# Patient Record
Sex: Female | Born: 1950 | Race: Black or African American | Hispanic: No | Marital: Single | Smoking: Former smoker
Health system: Southern US, Community
[De-identification: ages and names within clinical notes are randomized; demographics above are authoritative.]

## PROBLEM LIST (undated history)

## (undated) DIAGNOSIS — D649 Anemia, unspecified: Secondary | ICD-10-CM

## (undated) DIAGNOSIS — I499 Cardiac arrhythmia, unspecified: Secondary | ICD-10-CM

## (undated) DIAGNOSIS — I480 Paroxysmal atrial fibrillation: Secondary | ICD-10-CM

## (undated) DIAGNOSIS — I509 Heart failure, unspecified: Secondary | ICD-10-CM

## (undated) DIAGNOSIS — I10 Essential (primary) hypertension: Secondary | ICD-10-CM

## (undated) DIAGNOSIS — E119 Type 2 diabetes mellitus without complications: Secondary | ICD-10-CM

## (undated) DIAGNOSIS — I5032 Chronic diastolic (congestive) heart failure: Secondary | ICD-10-CM

## (undated) DIAGNOSIS — M1990 Unspecified osteoarthritis, unspecified site: Secondary | ICD-10-CM

## (undated) HISTORY — DX: Cardiac arrhythmia, unspecified: I49.9

## (undated) HISTORY — DX: Paroxysmal atrial fibrillation: I48.0

## (undated) HISTORY — DX: Heart failure, unspecified: I50.9

## (undated) HISTORY — DX: Chronic diastolic (congestive) heart failure: I50.32

---

## 2004-07-02 ENCOUNTER — Other Ambulatory Visit: Payer: Self-pay

## 2004-07-26 ENCOUNTER — Ambulatory Visit: Payer: Self-pay

## 2007-05-15 ENCOUNTER — Ambulatory Visit: Payer: Self-pay | Admitting: Family Medicine

## 2007-08-06 ENCOUNTER — Ambulatory Visit: Payer: Self-pay

## 2008-04-11 ENCOUNTER — Ambulatory Visit: Payer: Self-pay | Admitting: Family Medicine

## 2008-12-16 ENCOUNTER — Ambulatory Visit: Payer: Self-pay | Admitting: Family Medicine

## 2009-11-20 ENCOUNTER — Ambulatory Visit: Payer: Self-pay | Admitting: Internal Medicine

## 2010-02-13 ENCOUNTER — Ambulatory Visit: Payer: Self-pay | Admitting: Family Medicine

## 2010-06-03 ENCOUNTER — Ambulatory Visit: Payer: Self-pay | Admitting: Family Medicine

## 2011-04-26 ENCOUNTER — Ambulatory Visit: Payer: Self-pay | Admitting: Family Medicine

## 2011-06-27 ENCOUNTER — Emergency Department: Payer: Self-pay | Admitting: Emergency Medicine

## 2011-06-27 ENCOUNTER — Ambulatory Visit: Payer: Self-pay | Admitting: Family Medicine

## 2013-03-04 ENCOUNTER — Ambulatory Visit: Payer: Self-pay | Admitting: Family Medicine

## 2014-02-10 ENCOUNTER — Emergency Department: Payer: Self-pay | Admitting: Emergency Medicine

## 2020-04-13 ENCOUNTER — Inpatient Hospital Stay
Admission: EM | Admit: 2020-04-13 | Discharge: 2020-04-15 | DRG: 193 | Disposition: A | Discharge status: Home / Self Care | Payer: Medicare Other | Attending: Internal Medicine | Admitting: Internal Medicine

## 2020-04-13 ENCOUNTER — Other Ambulatory Visit: Payer: Self-pay

## 2020-04-13 ENCOUNTER — Emergency Department: Payer: Medicare Other

## 2020-04-13 ENCOUNTER — Encounter: Payer: Self-pay | Admitting: Emergency Medicine

## 2020-04-13 DIAGNOSIS — Z794 Long term (current) use of insulin: Secondary | ICD-10-CM | POA: Diagnosis not present

## 2020-04-13 DIAGNOSIS — J690 Pneumonitis due to inhalation of food and vomit: Secondary | ICD-10-CM

## 2020-04-13 DIAGNOSIS — J181 Lobar pneumonia, unspecified organism: Secondary | ICD-10-CM | POA: Insufficient documentation

## 2020-04-13 DIAGNOSIS — J189 Pneumonia, unspecified organism: Secondary | ICD-10-CM

## 2020-04-13 DIAGNOSIS — E785 Hyperlipidemia, unspecified: Secondary | ICD-10-CM | POA: Diagnosis present

## 2020-04-13 DIAGNOSIS — J9600 Acute respiratory failure, unspecified whether with hypoxia or hypercapnia: Secondary | ICD-10-CM

## 2020-04-13 DIAGNOSIS — Z20822 Contact with and (suspected) exposure to covid-19: Secondary | ICD-10-CM | POA: Diagnosis present

## 2020-04-13 DIAGNOSIS — M069 Rheumatoid arthritis, unspecified: Secondary | ICD-10-CM | POA: Diagnosis present

## 2020-04-13 DIAGNOSIS — E1165 Type 2 diabetes mellitus with hyperglycemia: Secondary | ICD-10-CM

## 2020-04-13 DIAGNOSIS — Z6833 Body mass index (BMI) 33.0-33.9, adult: Secondary | ICD-10-CM | POA: Diagnosis not present

## 2020-04-13 DIAGNOSIS — J9601 Acute respiratory failure with hypoxia: Secondary | ICD-10-CM | POA: Diagnosis present

## 2020-04-13 DIAGNOSIS — E119 Type 2 diabetes mellitus without complications: Secondary | ICD-10-CM

## 2020-04-13 DIAGNOSIS — I10 Essential (primary) hypertension: Secondary | ICD-10-CM | POA: Diagnosis present

## 2020-04-13 DIAGNOSIS — Z87891 Personal history of nicotine dependence: Secondary | ICD-10-CM | POA: Diagnosis not present

## 2020-04-13 HISTORY — DX: Type 2 diabetes mellitus without complications: E11.9

## 2020-04-13 HISTORY — DX: Essential (primary) hypertension: I10

## 2020-04-13 HISTORY — DX: Unspecified osteoarthritis, unspecified site: M19.90

## 2020-04-13 HISTORY — DX: Anemia, unspecified: D64.9

## 2020-04-13 LAB — BASIC METABOLIC PANEL
Anion gap: 11 (ref 5–15)
BUN: 16 mg/dL (ref 8–23)
CO2: 25 mmol/L (ref 22–32)
Calcium: 9.2 mg/dL (ref 8.9–10.3)
Chloride: 99 mmol/L (ref 98–111)
Creatinine, Ser: 0.98 mg/dL (ref 0.44–1.00)
GFR calc Af Amer: >60 mL/min (ref >60)
GFR calc non Af Amer: 58 mL/min — ABNORMAL LOW (ref >60)
Glucose, Bld: 312 mg/dL — ABNORMAL HIGH (ref 70–99)
Potassium: 3.7 mmol/L (ref 3.5–5.1)
Sodium: 135 mmol/L (ref 135–145)

## 2020-04-13 LAB — CBC WITH DIFFERENTIAL/PLATELET
Abs Immature Granulocytes: 0.06 10*3/uL (ref 0.00–0.07)
Basophils Absolute: 0.1 10*3/uL (ref 0.0–0.1)
Basophils Relative: 1 %
Eosinophils Absolute: 0.2 10*3/uL (ref 0.0–0.5)
Eosinophils Relative: 3 %
HCT: 33.5 % — ABNORMAL LOW (ref 36.0–46.0)
Hemoglobin: 11.1 g/dL — ABNORMAL LOW (ref 12.0–15.0)
Immature Granulocytes: 1 %
Lymphocytes Relative: 25 %
Lymphs Abs: 1.9 10*3/uL (ref 0.7–4.0)
MCH: 29.6 pg (ref 26.0–34.0)
MCHC: 33.1 g/dL (ref 30.0–36.0)
MCV: 89.3 fL (ref 80.0–100.0)
Monocytes Absolute: 0.4 10*3/uL (ref 0.1–1.0)
Monocytes Relative: 5 %
Neutro Abs: 4.9 10*3/uL (ref 1.7–7.7)
Neutrophils Relative %: 65 %
Platelets: 339 10*3/uL (ref 150–400)
RBC: 3.75 MIL/uL — ABNORMAL LOW (ref 3.87–5.11)
RDW: 12.8 % (ref 11.5–15.5)
WBC: 7.5 10*3/uL (ref 4.0–10.5)
nRBC: 0 % (ref 0.0–0.2)

## 2020-04-13 LAB — GLUCOSE, CAPILLARY: Glucose-Capillary: 212 mg/dL — ABNORMAL HIGH (ref 70–99)

## 2020-04-13 LAB — TROPONIN I (HIGH SENSITIVITY)
Troponin I (High Sensitivity): 9 ng/L (ref <18)
Troponin I (High Sensitivity): 36 ng/L — ABNORMAL HIGH (ref <18)

## 2020-04-13 LAB — BRAIN NATRIURETIC PEPTIDE: B Natriuretic Peptide: 197.7 pg/mL — ABNORMAL HIGH (ref 0.0–100.0)

## 2020-04-13 LAB — SARS CORONAVIRUS 2 BY RT PCR (HOSPITAL ORDER, PERFORMED IN ~~LOC~~ HOSPITAL LAB): SARS Coronavirus 2: NEGATIVE

## 2020-04-13 LAB — HIV ANTIBODY (ROUTINE TESTING W REFLEX): HIV Screen 4th Generation wRfx: NONREACTIVE

## 2020-04-13 MED ORDER — METFORMIN HCL ER 750 MG PO TB24: 1500.0000 mg | ORAL_TABLET | Freq: Every day | ORAL | Status: DC

## 2020-04-13 MED ORDER — MAGNESIUM OXIDE 400 (241.3 MG) MG PO TABS
800.0000 mg | ORAL_TABLET | Freq: Every day | ORAL | Status: DC
  Administered 2020-04-15: 800 mg via ORAL
  Filled 2020-04-13 (×2): qty 2

## 2020-04-13 MED ORDER — METFORMIN HCL ER 750 MG PO TB24
1500.0000 mg | ORAL_TABLET | Freq: Every day | ORAL | Status: DC
  Administered 2020-04-15: 1500 mg via ORAL
  Filled 2020-04-13 (×5): qty 2

## 2020-04-13 MED ORDER — INSULIN GLARGINE 100 UNIT/ML ~~LOC~~ SOLN
18.0000 [IU] | Freq: Every day | SUBCUTANEOUS | Status: DC
  Administered 2020-04-14: 21:00:00 18 [IU] via SUBCUTANEOUS
  Filled 2020-04-13 (×3): qty 0.18

## 2020-04-13 MED ORDER — GLIPIZIDE ER 10 MG PO TB24
20.0000 mg | ORAL_TABLET | Freq: Every day | ORAL | Status: DC
  Administered 2020-04-14 – 2020-04-15 (×2): 20 mg via ORAL
  Filled 2020-04-13 (×3): qty 2

## 2020-04-13 MED ORDER — CALCIUM CARBONATE-VITAMIN D 500-200 MG-UNIT PO TABS
1.0000 | ORAL_TABLET | Freq: Every day | ORAL | Status: DC
  Administered 2020-04-14 – 2020-04-15 (×2): 1 via ORAL
  Filled 2020-04-13 (×2): qty 1

## 2020-04-13 MED ORDER — AMLODIPINE BESYLATE 5 MG PO TABS
2.5000 mg | ORAL_TABLET | Freq: Every evening | ORAL | Status: DC
  Administered 2020-04-13 – 2020-04-14 (×2): 2.5 mg via ORAL
  Filled 2020-04-13 (×2): qty 1

## 2020-04-13 MED ORDER — MAGNESIUM OXIDE 400 (241.3 MG) MG PO TABS
400.0000 mg | ORAL_TABLET | Freq: Two times a day (BID) | ORAL | Status: DC
  Administered 2020-04-13 – 2020-04-15 (×5): 400 mg via ORAL
  Filled 2020-04-13 (×4): qty 1

## 2020-04-13 MED ORDER — SODIUM CHLORIDE 0.9 % IV SOLN: INTRAVENOUS | Status: DC

## 2020-04-13 MED ORDER — AZITHROMYCIN 500 MG PO TABS
500.0000 mg | ORAL_TABLET | Freq: Every day | ORAL | Status: DC
  Administered 2020-04-13 – 2020-04-15 (×3): 500 mg via ORAL
  Filled 2020-04-13 (×3): qty 1

## 2020-04-13 MED ORDER — INSULIN ASPART 100 UNIT/ML ~~LOC~~ SOLN
0.0000 [IU] | Freq: Three times a day (TID) | SUBCUTANEOUS | Status: DC
  Administered 2020-04-13: 3 [IU] via SUBCUTANEOUS
  Administered 2020-04-14: 5 [IU] via SUBCUTANEOUS
  Administered 2020-04-14 – 2020-04-15 (×3): 2 [IU] via SUBCUTANEOUS
  Filled 2020-04-13 (×5): qty 1

## 2020-04-13 MED ORDER — LOSARTAN POTASSIUM 50 MG PO TABS
100.0000 mg | ORAL_TABLET | Freq: Every day | ORAL | Status: DC
  Administered 2020-04-13 – 2020-04-15 (×3): 100 mg via ORAL
  Filled 2020-04-13 (×3): qty 2

## 2020-04-13 MED ORDER — METHYLPREDNISOLONE SODIUM SUCC 125 MG IJ SOLR
125.0000 mg | Freq: Once | INTRAMUSCULAR | Status: AC
  Administered 2020-04-13: 125 mg via INTRAVENOUS
  Filled 2020-04-13: qty 2

## 2020-04-13 MED ORDER — ENOXAPARIN SODIUM 40 MG/0.4ML ~~LOC~~ SOLN
40.0000 mg | SUBCUTANEOUS | Status: DC
  Administered 2020-04-13 – 2020-04-14 (×2): 40 mg via SUBCUTANEOUS
  Filled 2020-04-13 (×2): qty 0.4

## 2020-04-13 MED ORDER — PROBIOTIC DIGESTIVE SUPPORT PO CAPS: 1.0000 | ORAL_CAPSULE | Freq: Every day | ORAL | Status: DC

## 2020-04-13 MED ORDER — ATORVASTATIN CALCIUM 20 MG PO TABS
40.0000 mg | ORAL_TABLET | Freq: Every evening | ORAL | Status: DC
  Administered 2020-04-13 – 2020-04-15 (×3): 40 mg via ORAL
  Filled 2020-04-13 (×3): qty 2

## 2020-04-13 MED ORDER — INSULIN ASPART 100 UNIT/ML ~~LOC~~ SOLN
0.0000 [IU] | Freq: Every day | SUBCUTANEOUS | Status: DC
  Administered 2020-04-13: 3 [IU] via SUBCUTANEOUS
  Administered 2020-04-14: 21:00:00 2 [IU] via SUBCUTANEOUS
  Filled 2020-04-13 (×2): qty 1

## 2020-04-13 MED ORDER — SODIUM CHLORIDE 0.9 % IV SOLN
3.0000 g | Freq: Once | INTRAVENOUS | Status: AC
  Administered 2020-04-13: 3 g via INTRAVENOUS
  Filled 2020-04-13: qty 8

## 2020-04-13 MED ORDER — METOPROLOL TARTRATE 50 MG PO TABS
50.0000 mg | ORAL_TABLET | Freq: Two times a day (BID) | ORAL | Status: DC
  Administered 2020-04-13 – 2020-04-15 (×4): 50 mg via ORAL
  Filled 2020-04-13 (×4): qty 1

## 2020-04-13 MED ORDER — SODIUM CHLORIDE 0.9 % IV SOLN
2.0000 g | INTRAVENOUS | Status: DC
  Administered 2020-04-13 – 2020-04-14 (×2): 2 g via INTRAVENOUS
  Filled 2020-04-13 (×3): qty 20

## 2020-04-13 MED ORDER — HYDROCHLOROTHIAZIDE 25 MG PO TABS
25.0000 mg | ORAL_TABLET | Freq: Every day | ORAL | Status: DC
  Administered 2020-04-13 – 2020-04-15 (×3): 25 mg via ORAL
  Filled 2020-04-13 (×3): qty 1

## 2020-04-13 MED ORDER — ASPIRIN EC 81 MG PO TBEC
81.0000 mg | DELAYED_RELEASE_TABLET | Freq: Every day | ORAL | Status: DC
  Administered 2020-04-13 – 2020-04-15 (×3): 81 mg via ORAL
  Filled 2020-04-13 (×3): qty 1

## 2020-04-13 MED ORDER — IPRATROPIUM-ALBUTEROL 0.5-2.5 (3) MG/3ML IN SOLN
9.0000 mL | Freq: Once | RESPIRATORY_TRACT | Status: AC
  Administered 2020-04-13: 9 mL via RESPIRATORY_TRACT
  Filled 2020-04-13: qty 9

## 2020-04-13 NOTE — ED Notes (Signed)
Pt transported from rm 2 to rm 35. Pt placed on cardiac monitor and pure wick connected. Call light within reach. Pt has no further needs at this time.

## 2020-04-13 NOTE — ED Notes (Signed)
Admitting MD at bedside at this time, pt respiratory status improved from arrival at this time. Pt's friend at this time. Pt remains on 4L via  at this time.

## 2020-04-13 NOTE — ED Notes (Signed)
Message sent to pharmacy regarding verification of patient's medications.

## 2020-04-13 NOTE — ED Provider Notes (Signed)
[facility] Emergency Department Provider Note   ____________________________________________   First MD Initiated Contact with Patient 04/13/20 1403     (approximate)  I have reviewed the triage vital signs and the nursing notes.   HISTORY  Chief Complaint Respiratory Distress    HPI Karen Murphy is a 69 y.o. female with past medical history of hypertension, hyperlipidemia, diabetes, and rheumatoid arthritis who presents to the ED complaining of shortness of breath.  Patient reports that she started coughing this afternoon and felt lots of phlegm in her chest, afterwards had significant difficulty catching her breath.  EMS was called and initially found patient to have diminished lung sounds with sats of 60% on room air.  Patient does not use any supplemental oxygen at baseline.  She does admit to having a fall last week but denies any chest pain.  She denies any fevers and has not had any pain or swelling in her legs.  Patient was placed on CPAP by EMS and reports improvement in her symptoms with the mask in place.        History reviewed. No pertinent past medical history.  There are no problems to display for this patient.   History reviewed. No pertinent surgical history.  Prior to Admission medications   Not on File    Allergies Patient has no known allergies.  History reviewed. No pertinent family history.  Social History Social History   Tobacco Use  . Smoking status: Former Games developer  . Smokeless tobacco: Never Used  Substance Use Topics  . Alcohol use: Not on file  . Drug use: Not on file    Review of Systems  Constitutional: No fever/chills Eyes: No visual changes. ENT: No sore throat. Cardiovascular: Denies chest pain. Respiratory: Positive for cough and shortness of breath. Gastrointestinal: No abdominal pain.  No nausea, no vomiting.  No diarrhea.  No constipation. Genitourinary: Negative for dysuria. Musculoskeletal:  Negative for back pain. Skin: Negative for rash. Neurological: Negative for headaches, focal weakness or numbness.  ____________________________________________   PHYSICAL EXAM:  VITAL SIGNS: ED Triage Vitals  Enc Vitals Group     BP      Pulse      Resp      Temp      Temp src      SpO2      Weight      Height      Head Circumference      Peak Flow      Pain Score      Pain Loc      Pain Edu?      Excl. in GC?     Constitutional: Alert and oriented. Eyes: Conjunctivae are normal. Head: Atraumatic. Nose: No congestion/rhinnorhea. Mouth/Throat: Mucous membranes are moist. Neck: Normal ROM Cardiovascular: Normal rate, regular rhythm. Grossly normal heart sounds. Respiratory: Tachypneic with increased respiratory effort.  No retractions. Lungs with inspiratory next Tory wheezing throughout. Gastrointestinal: Soft and nontender. No distention. Genitourinary: deferred Musculoskeletal: No lower extremity tenderness nor edema. Neurologic:  Normal speech and language. No gross focal neurologic deficits are appreciated. Skin:  Skin is warm, dry and intact. No rash noted. Psychiatric: Mood and affect are normal. Speech and behavior are normal.  ____________________________________________   LABS (all labs ordered are listed, but only abnormal results are displayed)  Labs Reviewed  CBC WITH DIFFERENTIAL/PLATELET - Abnormal; Notable for the following components:      Result Value   RBC 3.75 (*)    Hemoglobin  11.1 (*)    HCT 33.5 (*)    All other components within normal limits  BRAIN NATRIURETIC PEPTIDE - Abnormal; Notable for the following components:   B Natriuretic Peptide 197.7 (*)    All other components within normal limits  BASIC METABOLIC PANEL - Abnormal; Notable for the following components:   Glucose, Bld 312 (*)    GFR calc non Af Amer 58 (*)    All other components within normal limits  BLOOD GAS, VENOUS - Abnormal; Notable for the following components:     pCO2, Ven 61 (*)    Bicarbonate 32.9 (*)    Acid-Base Excess 5.5 (*)    All other components within normal limits  SARS CORONAVIRUS 2 BY RT PCR (HOSPITAL ORDER, PERFORMED IN [facility])  TROPONIN I (HIGH SENSITIVITY)   ____________________________________________  EKG  ED ECG REPORT I, Chesley Noon, the attending physician, personally viewed and interpreted this ECG.   Date: 04/13/2020  EKG Time: 14:06  Rate: 94  Rhythm: normal sinus rhythm  Axis: Normal  Intervals:none  ST&T Change: None   PROCEDURES  Procedure(s) performed (including Critical Care):  .Critical Care Performed by: Chesley Noon, MD Authorized by: Chesley Noon, MD   Critical care provider statement:    Critical care time (minutes):  45   Critical care time was exclusive of:  Separately billable procedures and treating other patients and teaching time   Critical care was necessary to treat or prevent imminent or life-threatening deterioration of the following conditions:  Respiratory failure   Critical care was time spent personally by me on the following activities:  Discussions with consultants, evaluation of patient's response to treatment, examination of patient, ordering and performing treatments and interventions, ordering and review of laboratory studies, ordering and review of radiographic studies, pulse oximetry, re-evaluation of patient's condition, obtaining history from patient or surrogate and review of old charts   I assumed direction of critical care for this patient from another provider in my specialty: no       ____________________________________________   INITIAL IMPRESSION / ASSESSMENT AND PLAN / ED COURSE       69 year old female presents to the ED complaining of relatively acute onset cough and difficulty breathing, noted to be in respiratory distress by EMS with O2 sats of 60% on room air.  She arrives with CPAP mask in place and states this has been  improving her breathing, was subsequently transitioned to BiPAP.  She has significant inspiratory and expiratory wheezing on my exam, although denies any history of asthma or COPD, does have remote smoking history.  We will treat with duo nebs and steroids, further assess with chest x-ray.  EKG shows no evidence of arrhythmia or ischemia and cardiac process seems less likely.  Lab work, including troponin, is pending.  Lab work is reassuring, troponin within normal limits.  Chest x-ray does show right lower lobe opacity concerning for aspiration event, which would fit with patient's acute onset of symptoms with coughing.  We will start her on Unasyn and case was discussed with hospitalist for admission.  She has been transitioned to 4 L nasal cannula from BiPAP and appears to be doing well.      ____________________________________________   FINAL CLINICAL IMPRESSION(S) / ED DIAGNOSES  Final diagnoses:  Acute respiratory failure with hypoxia (HCC)  Aspiration pneumonia of right lower lobe, unspecified aspiration pneumonia type [facility])     ED Discharge Orders    None  Note:  This document was prepared using Dragon voice recognition software and may include unintentional dictation errors.   Chesley Noon, MD 04/13/20 [id number]

## 2020-04-13 NOTE — ED Notes (Signed)
Date and time results received: 04/13/20 5:19 PM  (use smartphrase ".now" to insert current time)  Test: Trop Critical Value: 36  Name of Provider Notified: Eliane Decree  Orders Received? Or Actions Taken?: Critical results acknowledged

## 2020-04-13 NOTE — ED Notes (Signed)
Abx initiated by this RN. Pt weaned off of Bipap per EDP. Placed on 4L via [place] by this RN. EDP and RT aware that patient currently on 4L via [place]. Call bell within reach at this time. Pt states understanding to call out should she have increased SOB.

## 2020-04-13 NOTE — ED Triage Notes (Signed)
Pt presents via [facility] with c/o respiratory distress. Pt reports initially becoming short of breath while laying down. Pt with diminished lung sounds and reported to be 60% on room air. Does not report using oxygen at home. No hx of chf. Pt placed on cpap by ems and given 1/2 inch nitro paste in route. Pt switched to bipap upon arrival with oxygen saturation of 93%. Pt takes lasix daily only to increase urine output per pt report. Pt currently alert and oriented x4.

## 2020-04-13 NOTE — H&P (Addendum)
[facility] - [place] at [facility]   PATIENT NAME: Karen Murphy    MR#:  [id number]  DATE OF BIRTH:  [birth year]  DATE OF ADMISSION:  04/13/2020  PRIMARY CARE PHYSICIAN: Garnette Czech, MD   REQUESTING/REFERRING PHYSICIAN: Dr. Larinda Buttery  Patient coming from : home   CHIEF COMPLAINT:  shortness of breath today with cough  HISTORY OF PRESENT ILLNESS:  Karen Murphy  is a 69 y.o. female with a known history of palpitation, anemia, hypertension, [name], hyperlipidemia, rheumatoid arthritis comes to the emergency room with increasing shortness of breath. Patient reports having a normal morning ate her breakfast thereafter started having some cough. She tried to fight it off started having shortness of breath. Called EMS found to have sats into the 60s. She was placed on BiPAP and brought to the emergency room  ED course: in the ER patient is hemodynamically stable. She was brought on BiPAP. Currently doing my evaluation she is 98 200% on 4 L. Workup in the ER showed chest x-ray with right lower lobe pneumonia. Her BNP is 197. Patient denies any recent travel. She however visited a friend in a nursing home on Sunday. Denies any fever or chills.  Patient denies any difficulty swallowing or choking while eating. She ambulates at home by herself and is able to take care of her ADLs.  Patient received IV antibiotics. She is being admitted for acute hypoxic respiratory failure secondary to right lower lobe pneumonia likely community acquired. PAST MEDICAL HISTORY:   Past Medical History:  Diagnosis Date  . Anemia   . Arthritis   . Diabetes mellitus without complication (HCC)   . Hypertension     PAST SURGICAL HISTOIRY:  History reviewed. No pertinent surgical history.  SOCIAL HISTORY:   Social History   Tobacco Use  . Smoking status: Former Games developer  . Smokeless tobacco: Never Used  Substance Use Topics  . Alcohol use: Not on file    FAMILY HISTORY:   History reviewed. No pertinent family history.  DRUG ALLERGIES:  No Known Allergies  REVIEW OF SYSTEMS:  Review of Systems  Constitutional: Negative for chills, fever and weight loss.  HENT: Negative for ear discharge, ear pain and nosebleeds.   Eyes: Negative for blurred vision, pain and discharge.  Respiratory: Positive for cough, sputum production and shortness of breath. Negative for wheezing and stridor.   Cardiovascular: Negative for chest pain, palpitations, orthopnea and PND.  Gastrointestinal: Negative for abdominal pain, diarrhea, nausea and vomiting.  Genitourinary: Negative for frequency and urgency.  Musculoskeletal: Negative for back pain and joint pain.  Neurological: Negative for sensory change, speech change, focal weakness and weakness.  Psychiatric/Behavioral: Negative for depression and hallucinations. The patient is not nervous/anxious.      MEDICATIONS AT HOME:   Prior to Admission medications   Not on File      VITAL SIGNS:  Blood pressure (!) 173/95, pulse 86, temperature 98.2 F (36.8 C), temperature source Oral, resp. rate 18, height 5\' 6"  (1.676 m), weight 94.8 kg, SpO2 97 %.  PHYSICAL EXAMINATION:  GENERAL:  69 y.o.-year-old patient lying in the bed with no acute distress. obese EYES: Pupils equal, round, reactive to light and accommodation. No scleral icterus.  HEENT: Head atraumatic, normocephalic. Oropharynx and nasopharynx clear.  NECK:  Supple, no jugular venous distention. No thyroid enlargement, no tenderness.  LUNGSCoarse breath sounds bilaterally, no wheezing, rales,rhonchi or crepitation. No use of accessory muscles of respiration.  CARDIOVASCULAR: S1, S2 normal. No murmurs,  rubs, or gallops.  ABDOMEN: Soft, nontender, nondistended. Bowel sounds present. No organomegaly or mass.  EXTREMITIES: No pedal edema, cyanosis, or clubbing.  NEUROLOGIC: Cranial nerves II through XII are intact. Muscle strength 5/5 in all extremities. Sensation  intact. Gait not checked.  PSYCHIATRIC: The patient is alert and oriented x 3.  SKIN: No obvious rash, lesion, or ulcer.   LABORATORY PANEL:   CBC Recent Labs  Lab 04/13/20 1401  WBC 7.5  HGB 11.1*  HCT 33.5*  PLT 339   ------------------------------------------------------------------------------------------------------------------  Chemistries  Recent Labs  Lab 04/13/20 1401  NA 135  K 3.7  CL 99  CO2 25  GLUCOSE 312*  BUN 16  CREATININE 0.98  CALCIUM 9.2   ------------------------------------------------------------------------------------------------------------------  Cardiac Enzymes No results for input(s): TROPONINI in the last 168 hours. ------------------------------------------------------------------------------------------------------------------  RADIOLOGY:  DG Chest Portable 1 View  Result Date: 04/13/2020 CLINICAL DATA:  Short of breath and respiratory distress. EXAM: PORTABLE CHEST 1 VIEW COMPARISON:  None. FINDINGS: Airspace disease in the right mid and lower lung. Probable pneumonia. No effusion. Left lung clear. Negative for heart failure or edema. Heart size is within normal limits. IMPRESSION: Infiltrate in the right lung base suspicious for pneumonia. Clinical correlation and follow-up chest x-ray suggested. Electronically Signed   By: Marlan Palau M.D.   On: 04/13/2020 14:34    EKG:    IMPRESSION AND PLAN:  Karen Murphy  is a 69 y.o. female with a known history of palpitation, anemia, hypertension, [name], hyperlipidemia, rheumatoid arthritis comes to the emergency room with increasing shortness of breath. Patient reports having a normal morning ate her breakfast thereafter started having some cough. She tried to fight it off started having shortness of breath. Called EMS found to have sats into the 60s. She was placed on BiPAP and brought to the emergency room  1. Acute hypoxic respiratory failure secondary to right lower lobe  pneumonia -admit to MedSurg -IV fluids -IV Rocephin and azithromycin -follow-up sputum culture -wean to room air is able to -currently sats are 98-100% on 4 L nasal cannula -PRN nebs -incentive spirometer  2. [name], uncontrolled -patient takes Toujeo, Ozempic and po meds -will resume meds once med rec is done -[facility]I  3. HTN Cont home meds  4. Morbid obesity  5. Hyperlipidemia continue statins  6. DVT prophylaxis subcu Lovenox   Family Communication :friend in ER Consults : Code Status :FULL code d/w pt DVT prophylaxis :lovenox  Status is: Inpatient  Remains inpatient appropriate because:Ongoing diagnostic testing needed not appropriate for outpatient work up   Dispo: The patient is from: Home              Anticipated d/c is to: Home              Anticipated d/c date is: 1 day              Patient currently is not medically stable to d/c.       TOTAL TIME TAKING CARE OF THIS PATIENT: *45* minutes.    Enedina Finner M.D  [facility]     CC: Primary care physician; Garnette Czech, MD

## 2020-04-13 NOTE — ED Notes (Signed)
Pt visualized resting in bed at this time with eyes closed, pt appears more comfortable then when she arrived.

## 2020-04-14 ENCOUNTER — Encounter: Payer: Self-pay | Admitting: Internal Medicine

## 2020-04-14 DIAGNOSIS — E119 Type 2 diabetes mellitus without complications: Secondary | ICD-10-CM

## 2020-04-14 DIAGNOSIS — I10 Essential (primary) hypertension: Secondary | ICD-10-CM

## 2020-04-14 LAB — HEMOGLOBIN A1C
Hgb A1c MFr Bld: 7 % — ABNORMAL HIGH (ref 4.8–5.6)
Mean Plasma Glucose: 154 mg/dL

## 2020-04-14 LAB — GLUCOSE, CAPILLARY
Glucose-Capillary: 273 mg/dL — ABNORMAL HIGH (ref 70–99)
Glucose-Capillary: 244 mg/dL — ABNORMAL HIGH (ref 70–99)
Glucose-Capillary: 206 mg/dL — ABNORMAL HIGH (ref 70–99)
Glucose-Capillary: 267 mg/dL — ABNORMAL HIGH (ref 70–99)
Glucose-Capillary: 174 mg/dL — ABNORMAL HIGH (ref 70–99)
Glucose-Capillary: 242 mg/dL — ABNORMAL HIGH (ref 70–99)

## 2020-04-14 NOTE — Progress Notes (Signed)
[facility]  - [place] at [facility] [place]   PATIENT NAME: Karen Murphy    MR#:  [id number]  DATE OF BIRTH:  [birth year]  SUBJECTIVE:  feels a lot better today. Mild cough. No fever. No respiratory distress REVIEW OF SYSTEMS:   Review of Systems  Constitutional: Negative for chills, fever and weight loss.  HENT: Negative for ear discharge, ear pain and nosebleeds.   Eyes: Negative for blurred vision, pain and discharge.  Respiratory: Positive for cough and shortness of breath. Negative for sputum production, wheezing and stridor.   Cardiovascular: Negative for chest pain, palpitations, orthopnea and PND.  Gastrointestinal: Negative for abdominal pain, diarrhea, nausea and vomiting.  Genitourinary: Negative for frequency and urgency.  Musculoskeletal: Negative for back pain and joint pain.  Neurological: Positive for weakness. Negative for sensory change, speech change and focal weakness.  Psychiatric/Behavioral: Negative for depression and hallucinations. The patient is not nervous/anxious.    Tolerating Diet:yes Tolerating PT: ambulatory  DRUG ALLERGIES:  No Known Allergies  VITALS:  Blood pressure (!) 173/95, pulse 86, temperature 98.2 F (36.8 C), temperature source Oral, resp. rate 18, height 5\' 6"  (1.676 m), weight 94.8 kg, SpO2 97 %.  PHYSICAL EXAMINATION:   Physical Exam  GENERAL:  69 y.o.-year-old patient lying in the bed with no acute distress.  EYES: Pupils equal, round, reactive to light and accommodation. No scleral icterus.   HEENT: Head atraumatic, normocephalic. Oropharynx and nasopharynx clear.  NECK:  Supple, no jugular venous distention. No thyroid enlargement, no tenderness.  LUNGS: decreased breath sounds right LL, no wheezing, rales, rhonchi. No use of accessory muscles of respiration.  CARDIOVASCULAR: S1, S2 normal. No murmurs, rubs, or gallops.  ABDOMEN: Soft, nontender, nondistended. Bowel sounds present. No organomegaly or mass.   EXTREMITIES: No cyanosis, clubbing or edema b/l.    NEUROLOGIC: Cranial nerves II through XII are intact. No focal Motor or sensory deficits b/l.   PSYCHIATRIC:  patient is alert and oriented x 3.  SKIN: No obvious rash, lesion, or ulcer.   LABORATORY PANEL:  CBC Recent Labs  Lab 04/13/20 1401  WBC 7.5  HGB 11.1*  HCT 33.5*  PLT 339    Chemistries  Recent Labs  Lab 04/13/20 1401  NA 135  K 3.7  CL 99  CO2 25  GLUCOSE 312*  BUN 16  CREATININE 0.98  CALCIUM 9.2   Cardiac Enzymes No results for input(s): TROPONINI in the last 168 hours. RADIOLOGY:  DG Chest Portable 1 View  Result Date: 04/13/2020 CLINICAL DATA:  Short of breath and respiratory distress. EXAM: PORTABLE CHEST 1 VIEW COMPARISON:  None. FINDINGS: Airspace disease in the right mid and lower lung. Probable pneumonia. No effusion. Left lung clear. Negative for heart failure or edema. Heart size is within normal limits. IMPRESSION: Infiltrate in the right lung base suspicious for pneumonia. Clinical correlation and follow-up chest x-ray suggested. Electronically Signed   By: 04/15/2020 M.D.   On: 04/13/2020 14:34   ASSESSMENT AND PLAN:  Karen Murphy  is a 69 y.o. female with a known history of palpitation, anemia, hypertension, [name], hyperlipidemia, rheumatoid arthritis comes to the emergency room with increasing shortness of breath. Patient reports having a normal morning ate her breakfast thereafter started having some cough. She tried to fight it off started having shortness of breath. Called EMS found to have sats into the 60s. She was placed on BiPAP and brought to the emergency room  1. Acute hypoxic respiratory failure secondary to right  lower lobe pneumonia -IV fluids--d/c now -IV Rocephin and azithromycin -follow-up sputum culture -wean to room air  -currently sats are 98-100% on 2 L nasal cannula -PRN nebs -incentive spirometer -OOB to chair  2. [name],  uncontrolled -patient takes Toujeo, Ozempic and po metformin -cont home meds except ozempic (takes every Monday) -SSI  3. HTN Cont losartan,metoprolol, HCTZ  4. Morbid obesity Diet and exercise discussed  5. Hyperlipidemia  continue statins  6. DVT prophylaxis subcu Lovenox   Family Communication :friend in ER Consults :none Code Status :FULL code d/w pt DVT prophylaxis :lovenox  Status is: Inpatient  Remains inpatient appropriate because:Ongoing diagnostic testing needed not appropriate for outpatient work up   Dispo: The patient is from: Home  Anticipated d/c is to: Home  Anticipated d/c date is: likely tomorrow  Patient currently is not medically stable to d/c.will try to wean her off oxygen today and cont IV abxs for one more day Status is: Inpatient         TOTAL TIME TAKING CARE OF THIS PATIENT: *25* minutes.  >50% time spent on counselling and coordination of care  Note: This dictation was prepared with Dragon dictation along with smaller phrase technology. Any transcriptional errors that result from this process are unintentional.  Karen Murphy M.D    [facility]   CC: Primary care physician; Karen Murphy, MDPatient ID: Karen Murphy, female   DOB: [birth year], 69 y.o.   MRN: [id number]

## 2020-04-14 NOTE — Evaluation (Signed)
Clinical/Bedside Swallow Evaluation Patient Details  Name: Karen Murphy MRN: [id number] Date of Birth: [birth year]  Today's Date: 04/14/2020 Time: SLP Start Time (ACUTE ONLY): 0820 SLP Stop Time (ACUTE ONLY): 0845 SLP Time Calculation (min) (ACUTE ONLY): 25 min  Past Medical History:  Past Medical History:  Diagnosis Date  . Anemia   . Arthritis   . Diabetes mellitus without complication (HCC)   . Hypertension    Past Surgical History: History reviewed. No pertinent surgical history. HPI:  Karen Murphy is a 69 y.o. female with past medical history of hypertension, hyperlipidemia, diabetes, and rheumatoid arthritis who presents to the ED complaining of shortness of breath.  Patient reports that she started coughing this afternoon and felt lots of phlegm in her chest, afterwards had significant difficulty catching her breath.Infiltrate in the right lung base suspicious for pneumonia. Clinicalcorrelation and follow-up chest x-ray suggested.   Assessment / Plan / Recommendation Clinical Impression  Pt presents with adequate oropharyngeal abilities and is at reduced risk of aspiration when consuming regular textures and thin liquids. Pt was free of overt s/s of dysphagia and aspiration and she states that she hasn't had difficulty with PO intake and has no history of pneumonia. Pt is good historian and she states that her hypoxia didn't correlate with PO intake. At this time, current diet appears appropriate. ST to sign off at this time.   SLP Visit Diagnosis: Dysphagia, unspecified (R13.10)    Aspiration Risk  No limitations    Diet Recommendation   Age appropriate regular with thin liquids  Medication Administration: Whole meds with liquid    Other  Recommendations Oral Care Recommendations: Oral care BID   Follow up Recommendations None      Frequency and Duration  (N/A)   (N/A)       Prognosis   N/A     Swallow Study   General Date of Onset: 04/13/20 HPI: Karen Murphy is a 69  y.o. female with past medical history of hypertension, hyperlipidemia, diabetes, and rheumatoid arthritis who presents to the ED complaining of shortness of breath.  Patient reports that she started coughing this afternoon and felt lots of phlegm in her chest, afterwards had significant difficulty catching her breath.Infiltrate in the right lung base suspicious for pneumonia. Clinicalcorrelation and follow-up chest x-ray suggested. Type of Study: Bedside Swallow Evaluation Previous Swallow Assessment: none in chart Diet Prior to this Study: Regular;Thin liquids Temperature Spikes Noted: No Respiratory Status: Nasal cannula (2 liters) History of Recent Intubation: No Behavior/Cognition: Alert;Cooperative;Pleasant mood Oral Cavity Assessment: Within Functional Limits Oral Care Completed by SLP: No Oral Cavity - Dentition: Adequate natural dentition Vision: Functional for self-feeding Self-Feeding Abilities: Able to feed self Patient Positioning: Upright in chair Baseline Vocal Quality: Normal Volitional Cough: Strong Volitional Swallow: Able to elicit    Oral/Motor/Sensory Function Overall Oral Motor/Sensory Function: Within functional limits   Ice Chips Ice chips: Not tested   Thin Liquid Thin Liquid: Within functional limits Presentation: Cup;Self Fed    Nectar Thick Nectar Thick Liquid: Not tested   Honey Thick Honey Thick Liquid: Not tested   Puree Puree: Not tested   Solid     Solid: Within functional limits Presentation: Self Fed     Karen Murphy B. Dreama Saa M.S., CCC-SLP, [facility] Speech-Language Pathologist Rehabilitation Services Office [phone]  Karen Murphy Dreama Saa 04/14/2020,10:01 AM

## 2020-04-14 NOTE — Evaluation (Signed)
Physical Therapy Evaluation Patient Details Name: Karen Murphy MRN: [id number] DOB: July 08, 1951 Today's Date: 04/14/2020   History of Present Illness  Karen Murphy is a 69yoF who comes [facility] on [facility] c SOB and cough. EMS assessed sats in 60s%. PMH: anemia, HTN, DM2, HLD, RA. Workup in the ER showed chest x-ray with right lower lobe pneumonia. Her BNP is 197.  Clinical Impression  Pt admitted with above diagnosis. Pt currently with functional limitations due to the deficits listed below (see "PT Problem List"). Upon entry, pt in recliner, awake and agreeable to participate. The pt is alert and oriented x4, pleasant, conversational, and generally a good historian. All mobility performed with low effort at modI level, SpO2 monitored Q142ft, remained 88-89% on room air AMB. Returned to 1L/min in room. Patient is very near baseline, all education completed, and time is given to address all questions/concerns. No additional skilled PT services needed at this time, PT signing off. PT recommends daily ambulation ad lib or with nursing staff as needed to prevent deconditioning.      Follow Up Recommendations No PT follow up    Equipment Recommendations  None recommended by PT    Recommendations for Other Services       Precautions / Restrictions Precautions Precautions: Fall Restrictions Weight Bearing Restrictions: No      Mobility  Bed Mobility Overal bed mobility: Modified Independent                Transfers Overall transfer level: Modified independent Equipment used: None                Ambulation/Gait Ambulation/Gait assistance: Supervision Gait Distance (Feet): 500 Feet Assistive device: None Gait Pattern/deviations: WFL(Within Functional Limits) Gait velocity: 0.25m/s   General Gait Details: on room air, SpO2 remains 88-89%, no dyspnea.  Stairs            Wheelchair Mobility    Modified Rankin (Stroke Patients Only)       Balance Overall balance  assessment: Modified Independent;No apparent balance deficits (not formally assessed)                                           Pertinent Vitals/Pain Pain Assessment: No/denies pain    Home Living Family/patient expects to be discharged to:: Private residence Living Arrangements: Alone   Type of Home:  (condo) Home Access: Stairs to enter   [employer] of Steps: 1 partial step Home Layout: One level Home Equipment: None;Cane - single point Additional Comments: keeps a SPC in car and house due to intermittent knee issus, but does not use regularly. Pt endorses 1 bad fall 2 weeks ago.    Prior Function Level of Independence: Independent         Comments: walks for activity at [employer] or [employer] due to heat or inclement weather.     Hand Dominance        Extremity/Trunk Assessment   Upper Extremity Assessment Upper Extremity Assessment: Overall WFL for tasks assessed    Lower Extremity Assessment Lower Extremity Assessment: Overall WFL for tasks assessed       Communication   Communication: No difficulties  Cognition Arousal/Alertness: Awake/alert Behavior During Therapy: WFL for tasks assessed/performed Overall Cognitive Status: Within Functional Limits for tasks assessed  General Comments      Exercises     Assessment/Plan    PT Assessment Patent does not need any further PT services  PT Problem List         PT Treatment Interventions      PT Goals (Current goals can be found in the Care Plan section)  Acute Rehab PT Goals PT Goal Formulation: All assessment and education complete, DC therapy    Frequency     Barriers to discharge        Co-evaluation               AM-PAC PT "6 Clicks" Mobility  Outcome Measure Help needed turning from your back to your side while in a flat bed without using bedrails?: A Little Help needed moving from lying on your  back to sitting on the side of a flat bed without using bedrails?: A Little Help needed moving to and from a bed to a chair (including a wheelchair)?: A Little Help needed standing up from a chair using your arms (e.g., wheelchair or bedside chair)?: A Little Help needed to walk in hospital room?: A Little Help needed climbing 3-5 steps with a railing? : A Little 6 Click Score: 18    End of Session Equipment Utilized During Treatment: Gait belt;Oxygen Activity Tolerance: Patient tolerated treatment well;No increased pain Patient left: in chair;with call bell/phone within reach Nurse Communication: Mobility status      Time: 5056-9794 PT Time Calculation (min) (ACUTE ONLY): 20 min   Charges:   PT Evaluation $PT Eval Low Complexity: 1 Low          11:47 AM, 04/14/20 Rosamaria Lints, PT, DPT Physical Therapist - [facility]  [phone] (ASCOM)    Elliott Quade C 04/14/2020, 11:45 AM

## 2020-04-15 DIAGNOSIS — J189 Pneumonia, unspecified organism: Secondary | ICD-10-CM

## 2020-04-15 LAB — GLUCOSE, CAPILLARY
Glucose-Capillary: 139 mg/dL — ABNORMAL HIGH (ref 70–99)
Glucose-Capillary: 173 mg/dL — ABNORMAL HIGH (ref 70–99)

## 2020-04-15 MED ORDER — CEPHALEXIN 500 MG PO CAPS: 500.0000 mg | ORAL_CAPSULE | Freq: Two times a day (BID) | ORAL | Status: DC

## 2020-04-15 MED ORDER — CEFDINIR 300 MG PO CAPS
300.0000 mg | ORAL_CAPSULE | Freq: Two times a day (BID) | ORAL | Status: DC
  Administered 2020-04-15: 300 mg via ORAL
  Filled 2020-04-15 (×2): qty 1

## 2020-04-15 MED ORDER — AZITHROMYCIN 500 MG PO TABS: 500.0000 mg | ORAL_TABLET | Freq: Every day | ORAL | 0 refills | Status: DC

## 2020-04-15 MED ORDER — CEPHALEXIN 500 MG PO CAPS: 500.0000 mg | ORAL_CAPSULE | Freq: Two times a day (BID) | ORAL | 0 refills | Status: DC

## 2020-04-15 MED ORDER — CEFDINIR 300 MG PO CAPS: 300.0000 mg | ORAL_CAPSULE | Freq: Two times a day (BID) | ORAL | 0 refills | Status: AC

## 2020-04-15 NOTE — Discharge Summary (Signed)
[facility] [place] [facility]   PATIENT NAME: Karen Murphy    MR#:  [id number]  DATE OF BIRTH:  [birth year]  DATE OF ADMISSION:  04/13/2020 ADMITTING PHYSICIAN: Enedina Finner, MD  DATE OF DISCHARGE: 04/15/2020  PRIMARY CARE PHYSICIAN: Garnette Czech, MD    ADMISSION DIAGNOSIS:  Acute respiratory failure with hypoxia (HCC) [J96.01] Acute hypoxemic respiratory failure (HCC) [J96.01] Aspiration pneumonia of right lower lobe, unspecified aspiration pneumonia type (HCC) [J69.0]  DISCHARGE DIAGNOSIS:  Acute Hypoxic respiratory failure resolved Right LL CAP  SECONDARY DIAGNOSIS:   Past Medical History:  Diagnosis Date  . Anemia   . Arthritis   . Diabetes mellitus without complication (HCC)   . Hypertension     HOSPITAL COURSE:   [name], anemia, hypertension, [name], hyperlipidemia, rheumatoid arthritis comes to the emergency room with increasing shortness of breath. Patient reports having a normal morning ate her breakfast thereafter started having some cough. She tried to fight it off started having shortness of breath. Called EMS found to have sats into the 60s. She was placed on BiPAP and brought to the emergency room  1.Acute hypoxic respiratory failure secondary to right lower lobe pneumonia -IV fluids--d/c now -IV Rocephin and azithromycin--change to po abxs -wean to room air  -currently sats are 98-100% on 2 L nasal cannula--weaned to RA -PRN nebs -incentive spirometer -OOB to chair -Ambualted well with PT--no needs -ST--no s/o aspiration  2.[name], uncontrolled -patient takesToujeo, Ozempic and po metformin -cont home meds except ozempic (takes every Monday) -SSI -A1c is 7.0  3. HTN Cont losartan,metoprolol, HCTZ  4.Morbid obesity Diet and exercise discussed  5.Hyperlipidemia  continue statins  6.DVT prophylaxis subcu Lovenox  Overall  improved. D/c home--pt agreeable  Family Communication:friend in ER Consults:none Code Status:FULL code d/w pt DVT prophylaxis:lovenox  Status is: Inpatient  Dispo: The patient is from:Home Anticipated d/c is RC:VELF Anticipated d/c date YB:OFBPZ Patient currently is  medically stable to d/c   CONSULTS OBTAINED:    DRUG ALLERGIES:  No Known Allergies  DISCHARGE MEDICATIONS:   Allergies as of 04/15/2020   No Known Allergies     Medication List    TAKE these medications   acetaminophen 325 MG tablet Commonly known as: TYLENOL Take 650 mg by mouth every 6 (six) hours as needed for mild pain or moderate pain.   amLODipine 2.5 MG tablet Commonly known as: NORVASC Take 2.5 mg by mouth every evening.   atorvastatin 40 MG tablet Commonly known as: LIPITOR Take 40 mg by mouth every evening.   azithromycin 500 MG tablet Commonly known as: ZITHROMAX Take 1 tablet (500 mg total) by mouth daily. Start taking on: April 16, 2020   Calcium 600/Vitamin D 600-400 MG-UNIT Tabs Generic drug: Calcium Carbonate-Vitamin D3 Take 1 tablet by mouth daily.   cephALEXin 500 MG capsule Commonly known as: KEFLEX Take 1 capsule (500 mg total) by mouth every 12 (twelve) hours for 5 days. What changed: when to take this   glipiZIDE 10 MG 24 hr tablet Commonly known as: GLUCOTROL XL Take 20 mg by mouth daily.   hydrochlorothiazide 25 MG tablet Commonly known as: HYDRODIURIL Take 25 mg by mouth daily.   losartan 100 MG tablet Commonly known as: COZAAR Take 100 mg by mouth daily.   magnesium oxide 400 MG tablet Commonly known as: MAG-OX Take 400-800 mg by mouth See admin instructions. Take 2 tablets (800mg ) by mouth every morning, 1  tablet (400mg ) by mouth at midday and take 1 tablet (400mg ) by mouth every evening   metFORMIN 500 MG 24 hr tablet Commonly known as: GLUCOPHAGE-XR Take 1,500 mg by mouth daily with supper.    metoprolol tartrate 50 MG tablet Commonly known as: LOPRESSOR Take 50 mg by mouth 2 (two) times daily.   naproxen sodium 220 MG tablet Commonly known as: ALEVE Take 220 mg by mouth every 12 (twelve) hours as needed (pain).   ondansetron 8 MG tablet Commonly known as: ZOFRAN Take 8 mg by mouth every 8 (eight) hours as needed for nausea or vomiting.   Ozempic (0.25 or 0.5 MG/DOSE) 2 MG/1.5ML Sopn Generic drug: Semaglutide(0.25 or 0.5MG /DOS) Inject 0.5 mg into the skin every Monday.   Probiotic Digestive Support Caps Take 1 capsule by mouth daily.   Toujeo SoloStar 300 UNIT/ML Solostar Pen Generic drug: insulin glargine (1 Unit Dial) Inject 18 Units into the skin at bedtime.       If you experience worsening of your admission symptoms, develop shortness of breath, life threatening emergency, suicidal or homicidal thoughts you must seek medical attention immediately by calling 911 or calling your MD immediately  if symptoms less severe.  You Must read complete instructions/literature along with all the possible adverse reactions/side effects for all the Medicines you take and that have been prescribed to you. Take any new Medicines after you have completely understood and accept all the possible adverse reactions/side effects.   Please note  You were cared for by a hospitalist during your hospital stay. If you have any questions about your discharge medications or the care you received while you were in the hospital after you are discharged, you can call the unit and asked to speak with the hospitalist on call if the hospitalist that took care of you is not available. Once you are discharged, your primary care physician will handle any further medical issues. Please note that NO REFILLS for any discharge medications will be authorized once you are discharged, as it is imperative that you return to your primary care physician (or establish a relationship with a primary care physician if  you do not have one) for your aftercare needs so that they can reassess your need for medications and monitor your lab values. Today   SUBJECTIVE   Feels a lot better  VITAL SIGNS:  Blood pressure (!) 158/77, pulse 68, temperature 97.9 F (36.6 C), temperature source Oral, resp. rate 18, height 5\' 6"  (1.676 m), weight 94.8 kg, SpO2 100 %.  I/O:  No intake or output data in the 24 hours ending 04/15/20 0906  PHYSICAL EXAMINATION:  GENERAL:  69 y.o.-year-old patient lying in the bed with no acute distress.  EYES: Pupils equal, round, reactive to light and accommodation. No scleral icterus.  HEENT: Head atraumatic, normocephalic. Oropharynx and nasopharynx clear.  NECK:  Supple, no jugular venous distention. No thyroid enlargement, no tenderness.  LUNGS: Normal breath sounds bilaterally, no wheezing, rales,rhonchi or crepitation. No use of accessory muscles of respiration.  CARDIOVASCULAR: S1, S2 normal. No murmurs, rubs, or gallops.  ABDOMEN: Soft, non-tender, non-distended. Bowel sounds present. No organomegaly or mass.  EXTREMITIES: No pedal edema, cyanosis, or clubbing.  NEUROLOGIC: Cranial nerves II through XII are intact. Muscle strength 5/5 in all extremities. Sensation intact. Gait not checked.  PSYCHIATRIC: The patient is alert and oriented x 3.  SKIN: No obvious rash, lesion, or ulcer.   DATA REVIEW:   CBC  Recent Labs  Lab 04/13/20 1401  WBC 7.5  HGB 11.1*  HCT 33.5*  PLT 339    Chemistries  Recent Labs  Lab 04/13/20 1401  NA 135  K 3.7  CL 99  CO2 25  GLUCOSE 312*  BUN 16  CREATININE 0.98  CALCIUM 9.2    Microbiology Results   Recent Results (from the past 240 hour(s))  SARS Coronavirus 2 by RT PCR (hospital order, performed in [facility] hospital lab) Nasopharyngeal Nasopharyngeal Swab     Status: None   Collection Time: 04/13/20  2:01 PM   Specimen: Nasopharyngeal Swab  Result Value Ref Range Status   SARS Coronavirus 2 NEGATIVE NEGATIVE Final     Comment: (NOTE) SARS-CoV-2 target nucleic acids are NOT DETECTED.  The SARS-CoV-2 RNA is generally detectable in upper and lower respiratory specimens during the acute phase of infection. The lowest concentration of SARS-CoV-2 viral copies this assay can detect is 250 copies / mL. A negative result does not preclude SARS-CoV-2 infection and should not be used as the sole basis for treatment or other patient management decisions.  A negative result may occur with improper specimen collection / handling, submission of specimen other than nasopharyngeal swab, presence of viral mutation(s) within the areas targeted by this assay, and inadequate number of viral copies (<250 copies / mL). A negative result must be combined with clinical observations, patient history, and epidemiological information.  Fact Sheet for Patients:   BoilerBrush.com.cy  Fact Sheet for Healthcare Providers: https://pope.com/  This test is not yet approved or  cleared by the [place] FDA and has been authorized for detection and/or diagnosis of SARS-CoV-2 by FDA under an Emergency Use Authorization (EUA).  This EUA will remain in effect (meaning this test can be used) for the duration of the COVID-19 declaration under Section 564(b)(1) of the Act, 21 U.S.C. section 360bbb-3(b)(1), unless the authorization is terminated or revoked sooner.  Performed at [facility], [address] [place], [place] [address]     RADIOLOGY:  DG Chest Portable 1 View  Result Date: 04/13/2020 CLINICAL DATA:  Short of breath and respiratory distress. EXAM: PORTABLE CHEST 1 VIEW COMPARISON:  None. FINDINGS: Airspace disease in the right mid and lower lung. Probable pneumonia. No effusion. Left lung clear. Negative for heart failure or edema. Heart size is within normal limits. IMPRESSION: Infiltrate in the right lung base suspicious for pneumonia. Clinical correlation  and follow-up chest x-ray suggested. Electronically Signed   By: Marlan Palau M.D.   On: 04/13/2020 14:34     CODE STATUS:     Code Status Orders  (From admission, onward)         Start     Ordered   04/13/20 1605  Full code  Continuous        04/13/20 1604        Code Status History    This patient has a current code status but no historical code status.   Advance Care Planning Activity    Advance Directive Documentation     Most Recent Value  Type of Advance Directive Healthcare Power of Attorney  Pre-existing out of facility DNR order (yellow form or pink MOST form) --  "MOST" Form in Place? --       TOTAL TIME TAKING CARE OF THIS PATIENT: *40* minutes.    Enedina Finner M.D  [facility]    CC: Primary care physician; Garnette Czech, MD

## 2020-04-15 NOTE — Care Management Important Message (Signed)
Important Message  Patient Details  Name: Karen Murphy MRN: [id number] Date of Birth: 05/07/1951   Medicare Important Message Given:  Yes     [name] 04/15/2020, 10:43 AM

## 2020-04-20 LAB — BLOOD GAS, VENOUS
Acid-Base Excess: 5.5 mmol/L — ABNORMAL HIGH (ref 0.0–2.0)
Bicarbonate: 32.9 mmol/L — ABNORMAL HIGH (ref 20.0–28.0)
O2 Saturation: 43.2 %
Patient temperature: 37
pCO2, Ven: 61 mmHg — ABNORMAL HIGH (ref 44.0–60.0)
pH, Ven: 7.34 (ref 7.250–7.430)

## 2020-07-24 ENCOUNTER — Emergency Department: Payer: Medicare Other

## 2020-07-24 ENCOUNTER — Encounter: Payer: Self-pay | Admitting: Emergency Medicine

## 2020-07-24 ENCOUNTER — Inpatient Hospital Stay: Payer: Medicare Other

## 2020-07-24 ENCOUNTER — Observation Stay
Admission: EM | Admit: 2020-07-24 | Discharge: 2020-07-25 | Disposition: A | Discharge status: Home / Self Care | Payer: Medicare Other | Attending: Internal Medicine | Admitting: Internal Medicine

## 2020-07-24 ENCOUNTER — Other Ambulatory Visit: Payer: Self-pay

## 2020-07-24 DIAGNOSIS — I10 Essential (primary) hypertension: Secondary | ICD-10-CM | POA: Diagnosis not present

## 2020-07-24 DIAGNOSIS — Z79899 Other long term (current) drug therapy: Secondary | ICD-10-CM | POA: Insufficient documentation

## 2020-07-24 DIAGNOSIS — Z794 Long term (current) use of insulin: Secondary | ICD-10-CM | POA: Insufficient documentation

## 2020-07-24 DIAGNOSIS — R55 Syncope and collapse: Secondary | ICD-10-CM | POA: Diagnosis present

## 2020-07-24 DIAGNOSIS — E119 Type 2 diabetes mellitus without complications: Secondary | ICD-10-CM | POA: Diagnosis not present

## 2020-07-24 DIAGNOSIS — I4891 Unspecified atrial fibrillation: Principal | ICD-10-CM | POA: Diagnosis present

## 2020-07-24 DIAGNOSIS — Z23 Encounter for immunization: Secondary | ICD-10-CM | POA: Insufficient documentation

## 2020-07-24 DIAGNOSIS — Z87891 Personal history of nicotine dependence: Secondary | ICD-10-CM | POA: Insufficient documentation

## 2020-07-24 DIAGNOSIS — Z20822 Contact with and (suspected) exposure to covid-19: Secondary | ICD-10-CM | POA: Diagnosis not present

## 2020-07-24 LAB — TSH: TSH: 0.746 u[IU]/mL (ref 0.350–4.500)

## 2020-07-24 LAB — CBC
HCT: 33.6 % — ABNORMAL LOW (ref 36.0–46.0)
Hemoglobin: 11.5 g/dL — ABNORMAL LOW (ref 12.0–15.0)
MCH: 30.3 pg (ref 26.0–34.0)
MCHC: 34.2 g/dL (ref 30.0–36.0)
MCV: 88.4 fL (ref 80.0–100.0)
Platelets: 350 10*3/uL (ref 150–400)
RBC: 3.8 MIL/uL — ABNORMAL LOW (ref 3.87–5.11)
RDW: 12.3 % (ref 11.5–15.5)
WBC: 8 10*3/uL (ref 4.0–10.5)
nRBC: 0 % (ref 0.0–0.2)

## 2020-07-24 LAB — TROPONIN I (HIGH SENSITIVITY)
Troponin I (High Sensitivity): 14 ng/L (ref <18)
Troponin I (High Sensitivity): 19 ng/L — ABNORMAL HIGH (ref <18)
Troponin I (High Sensitivity): 17 ng/L (ref <18)

## 2020-07-24 LAB — BASIC METABOLIC PANEL
Anion gap: 14 (ref 5–15)
BUN: 20 mg/dL (ref 8–23)
CO2: 22 mmol/L (ref 22–32)
Calcium: 9.5 mg/dL (ref 8.9–10.3)
Chloride: 96 mmol/L — ABNORMAL LOW (ref 98–111)
Creatinine, Ser: 1.11 mg/dL — ABNORMAL HIGH (ref 0.44–1.00)
GFR, Estimated: 50 mL/min — ABNORMAL LOW (ref >60)
Glucose, Bld: 265 mg/dL — ABNORMAL HIGH (ref 70–99)
Potassium: 3.8 mmol/L (ref 3.5–5.1)
Sodium: 132 mmol/L — ABNORMAL LOW (ref 135–145)

## 2020-07-24 LAB — RESPIRATORY PANEL BY RT PCR (FLU A&B, COVID)
Influenza A by PCR: NEGATIVE
Influenza B by PCR: NEGATIVE
SARS Coronavirus 2 by RT PCR: NEGATIVE

## 2020-07-24 LAB — BRAIN NATRIURETIC PEPTIDE: B Natriuretic Peptide: 63.6 pg/mL (ref 0.0–100.0)

## 2020-07-24 LAB — HEMOGLOBIN A1C
Hgb A1c MFr Bld: 7.5 % — ABNORMAL HIGH (ref 4.8–5.6)
Mean Plasma Glucose: 168.55 mg/dL

## 2020-07-24 LAB — MAGNESIUM: Magnesium: 1.7 mg/dL (ref 1.7–2.4)

## 2020-07-24 LAB — GLUCOSE, CAPILLARY: Glucose-Capillary: 184 mg/dL — ABNORMAL HIGH (ref 70–99)

## 2020-07-24 MED ORDER — DILTIAZEM HCL 25 MG/5ML IV SOLN
10.0000 mg | Freq: Once | INTRAVENOUS | Status: AC
  Administered 2020-07-24: 10 mg via INTRAVENOUS
  Filled 2020-07-24: qty 5

## 2020-07-24 MED ORDER — BACITRACIN ZINC 500 UNIT/GM EX OINT
TOPICAL_OINTMENT | Freq: Two times a day (BID) | CUTANEOUS | Status: DC
  Filled 2020-07-24 (×2): qty 0.9

## 2020-07-24 MED ORDER — DILTIAZEM HCL 25 MG/5ML IV SOLN
10.0000 mg | Freq: Once | INTRAVENOUS | Status: AC
  Administered 2020-07-24: 10 mg via INTRAVENOUS

## 2020-07-24 MED ORDER — METOPROLOL TARTRATE 50 MG PO TABS
50.0000 mg | ORAL_TABLET | Freq: Two times a day (BID) | ORAL | Status: DC
  Administered 2020-07-24 – 2020-07-25 (×3): 50 mg via ORAL
  Filled 2020-07-24 (×3): qty 1

## 2020-07-24 MED ORDER — ONDANSETRON HCL 4 MG/2ML IJ SOLN: 4.0000 mg | Freq: Four times a day (QID) | INTRAMUSCULAR | Status: DC | PRN

## 2020-07-24 MED ORDER — LOSARTAN POTASSIUM 50 MG PO TABS: 100.0000 mg | ORAL_TABLET | Freq: Every day | ORAL | Status: DC

## 2020-07-24 MED ORDER — ACETAMINOPHEN 325 MG PO TABS
650.0000 mg | ORAL_TABLET | Freq: Four times a day (QID) | ORAL | Status: DC | PRN
  Administered 2020-07-24 – 2020-07-25 (×3): 650 mg via ORAL
  Filled 2020-07-24 (×3): qty 2

## 2020-07-24 MED ORDER — SODIUM CHLORIDE 0.9 % IV BOLUS
1000.0000 mL | Freq: Once | INTRAVENOUS | Status: AC
  Administered 2020-07-24: 1000 mL via INTRAVENOUS

## 2020-07-24 MED ORDER — DILTIAZEM HCL-DEXTROSE 125-5 MG/125ML-% IV SOLN (PREMIX)
5.0000 mg/h | INTRAVENOUS | Status: DC
  Administered 2020-07-24: 5 mg/h via INTRAVENOUS
  Filled 2020-07-24: qty 125

## 2020-07-24 MED ORDER — DILTIAZEM HCL 25 MG/5ML IV SOLN
INTRAVENOUS | Status: AC
  Filled 2020-07-24: qty 5

## 2020-07-24 MED ORDER — ACETAMINOPHEN 325 MG PO TABS: 650.0000 mg | ORAL_TABLET | ORAL | Status: DC | PRN

## 2020-07-24 MED ORDER — ATORVASTATIN CALCIUM 20 MG PO TABS
40.0000 mg | ORAL_TABLET | Freq: Every evening | ORAL | Status: DC
  Administered 2020-07-24: 40 mg via ORAL
  Filled 2020-07-24 (×2): qty 2

## 2020-07-24 MED ORDER — CALCIUM CARBONATE-VITAMIN D 500-200 MG-UNIT PO TABS
1.0000 | ORAL_TABLET | Freq: Every day | ORAL | Status: DC
  Administered 2020-07-24 – 2020-07-25 (×2): 1 via ORAL
  Filled 2020-07-24 (×3): qty 1

## 2020-07-24 MED ORDER — TETANUS-DIPHTH-ACELL PERTUSSIS 5-2.5-18.5 LF-MCG/0.5 IM SUSP
0.5000 mL | Freq: Once | INTRAMUSCULAR | Status: AC
  Administered 2020-07-24: 0.5 mL via INTRAMUSCULAR
  Filled 2020-07-24: qty 0.5

## 2020-07-24 MED ORDER — INSULIN ASPART 100 UNIT/ML ~~LOC~~ SOLN
0.0000 [IU] | Freq: Three times a day (TID) | SUBCUTANEOUS | Status: DC
  Administered 2020-07-24 – 2020-07-25 (×2): 3 [IU] via SUBCUTANEOUS
  Administered 2020-07-25: 8 [IU] via SUBCUTANEOUS
  Filled 2020-07-24 (×3): qty 1

## 2020-07-24 MED ORDER — APIXABAN 5 MG PO TABS
5.0000 mg | ORAL_TABLET | Freq: Two times a day (BID) | ORAL | Status: DC
  Administered 2020-07-25 (×2): 5 mg via ORAL
  Filled 2020-07-24 (×2): qty 1

## 2020-07-24 MED ORDER — RISAQUAD PO CAPS
1.0000 | ORAL_CAPSULE | Freq: Every day | ORAL | Status: DC
  Administered 2020-07-24 – 2020-07-25 (×2): 1 via ORAL
  Filled 2020-07-24 (×2): qty 1

## 2020-07-24 NOTE — Consult Note (Signed)
Cardiology Consultation:   [name] MRN: [id number]; DOB: 1951-10-09  Admit date: 07/24/2020 Date of Consult: 07/24/2020  Primary Care Provider: Garnette Czech, MD [facility] Cardiologist: New to [facility] Physician requesting consult: Dr. Joylene Igo Reason for consult: Ectopy, atrial fibrillation with RVR   Patient Profile:   Karen Murphy is a 69 y.o. female with a hx of hypertension, former smoker, diabetes, hospital admission July 2021 for acute respiratory failure with hypoxia/pneumonia Presenting with syncope, atrial fibrillation with RVR  History of Present Illness:   Karen Murphy woke this morning, appreciated tachypalpitations, called her friend, was standing in her house when she felt lightheaded, weak, had syncope, woke up on the ground.  She struck her right side of her face on a piece of furniture, Denies any prior history of atrial fibrillation Reports staying adequately hydrated Reports that she was on the phone with a friend at the time when she had syncope, he called [phone]  Denies any tachycardia palpitations yesterday  Takes  metoprolol, losartan, HCTZ,Amlodipine  CT head no acute findings CT face no facial fracture Chest x-ray nonacute  In the emergency room treated with diltiazem, though noted to have persistent orthostasis Elevated rate with standing.  Documented rate 115 up to 145-150 with standing Negative cardiac enzymes  Reports lots of snoring, never had sleep study   Past Medical History:  Diagnosis Date  . Anemia   . Arthritis   . Diabetes mellitus without complication (HCC)   . Hypertension     History reviewed. No pertinent surgical history.   Home Medications:  Prior to Admission medications   Medication Sig Start Date End Date Taking? Authorizing Provider  ascorbic acid (VITAMIN C) 500 MG tablet Take 500 mg by mouth daily.   Yes [provider]  atorvastatin (LIPITOR) 40 MG tablet Take 40 mg by mouth every  evening. 01/04/20  Yes [provider]  Calcium Carbonate-Vitamin D3 (CALCIUM 600/VITAMIN D) 600-400 MG-UNIT TABS Take 1 tablet by mouth daily.   Yes [provider]  glipiZIDE (GLUCOTROL XL) 10 MG 24 hr tablet Take 20 mg by mouth daily. 02/03/20  Yes [provider]  Lactobacillus-Inulin (PROBIOTIC DIGESTIVE SUPPORT) CAPS Take 1 capsule by mouth daily.   Yes [provider]  losartan (COZAAR) 100 MG tablet Take 100 mg by mouth daily. 03/21/20  Yes [provider]  metFORMIN (GLUCOPHAGE-XR) 500 MG 24 hr tablet Take 1,500 mg by mouth daily with supper. 10/19/19  Yes [provider]  metoprolol tartrate (LOPRESSOR) 50 MG tablet Take 50 mg by mouth 2 (two) times daily. 01/04/20  Yes [provider]  TOUJEO SOLOSTAR 300 UNIT/ML Solostar Pen Inject 18 Units into the skin at bedtime. 01/04/20  Yes [provider]  acetaminophen (TYLENOL) 325 MG tablet Take 650 mg by mouth every 6 (six) hours as needed for mild pain or moderate pain.    [provider]  naproxen sodium (ALEVE) 220 MG tablet Take 220 mg by mouth every 12 (twelve) hours as needed (pain).    [provider]  OZEMPIC, 0.25 OR 0.5 MG/DOSE, 2 MG/1.5ML SOPN Inject 0.5 mg into the skin every Monday. 03/22/20   [provider]    Inpatient Medications: Scheduled Meds: . apixaban  5 mg Oral BID  . atorvastatin  40 mg Oral QPM  . bacitracin   Topical BID  . Calcium Carbonate-Vitamin D3  1 tablet Oral Daily  . diltiazem      . insulin aspart  0-15 Units  Subcutaneous TID WC  . losartan  100 mg Oral Daily  . metoprolol tartrate  50 mg Oral BID  . Probiotic Digestive Support  1 capsule Oral Daily   Continuous Infusions: . diltiazem (CARDIZEM) infusion 5 mg/hr (07/24/20 1502)   PRN Meds: acetaminophen, ondansetron (ZOFRAN) IV  Allergies:   No Known Allergies  Social History:   Social History   Socioeconomic History  . Marital status: Single     Spouse name: Not on file  . Number of children: Not on file  . Years of education: Not on file  . Highest education level: Not on file  Occupational History  . Not on file  Tobacco Use  . Smoking status: Former Games developer  . Smokeless tobacco: Never Used  Substance and Sexual Activity  . Alcohol use: Not on file  . Drug use: Not on file  . Sexual activity: Not on file  Other Topics Concern  . Not on file  Social History Narrative  . Not on file   Social Determinants of Health   Financial Resource Strain:   . Difficulty of Paying Living Expenses: Not on file  Food Insecurity:   . Worried About Programme researcher, broadcasting/film/video in the Last Year: Not on file  . Ran Out of Food in the Last Year: Not on file  Transportation Needs:   . Lack of Transportation (Medical): Not on file  . Lack of Transportation (Non-Medical): Not on file  Physical Activity:   . Days of Exercise per Week: Not on file  . Minutes of Exercise per Session: Not on file  Stress:   . Feeling of Stress : Not on file  Social Connections:   . Frequency of Communication with Friends and Family: Not on file  . Frequency of Social Gatherings with Friends and Family: Not on file  . Attends Religious Services: Not on file  . Active Member of Clubs or Organizations: Not on file  . Attends Banker Meetings: Not on file  . Marital Status: Not on file  Intimate Partner Violence:   . Fear of Current or Ex-Partner: Not on file  . Emotionally Abused: Not on file  . Physically Abused: Not on file  . Sexually Abused: Not on file    Family History:    Family History  Problem Relation Age of Onset  . Hypertension Mother   . Hypertension Father      ROS:  Please see the history of present illness.  Review of Systems  Constitutional: Negative.   HENT: Negative.   Respiratory: Negative.   Cardiovascular: Negative.   Gastrointestinal: Negative.   Musculoskeletal: Negative.   Neurological: Positive for loss of  consciousness.  Psychiatric/Behavioral: Negative.   All other systems reviewed and are negative.   Physical Exam/Data:   Vitals:   07/24/20 1300 07/24/20 1330 07/24/20 1400 07/24/20 1430  BP: 137/69 (!) 144/61 122/68 112/68  Pulse: 65 89 86 73  Resp: 16 13 17  (!) 23  SpO2: 96% 97% 97% 97%   No intake or output data in the 24 hours ending 07/24/20 1506 Last 3 Weights 04/13/2020  Weight (lbs) 208 lb 15.9 oz  Weight (kg) 94.8 kg     There is no height or weight on file to calculate BMI.  General:  Well nourished, well developed, in no acute distress HEENT: normal Lymph: no adenopathy Neck: no JVD Endocrine:  No thryomegaly Vascular: No carotid bruits; FA pulses 2+ bilaterally without bruits  Cardiac: Irregularly irregular no murmur  Lungs:  clear to auscultation bilaterally, no wheezing, rhonchi or rales  Abd: soft, nontender, no hepatomegaly  Ext: no edema Musculoskeletal:  No deformities, BUE and BLE strength normal and equal Skin: warm and dry  Neuro:  CNs 2-12 intact, no focal abnormalities noted Psych:  Normal affect   EKG:  The EKG was personally reviewed and demonstrates:   Atrial fibrillation rate 133 bpm no significant ST-T wave changes  Telemetry:  Telemetry was personally reviewed and demonstrates: Atrial fibrillation rate 120 up to 140 bpm  Relevant CV Studies: Echocardiogram pending  Laboratory Data:  High Sensitivity Troponin:   Recent Labs  Lab 07/24/20 1001  TROPONINIHS 14     Chemistry Recent Labs  Lab 07/24/20 1001  NA 132*  K 3.8  CL 96*  CO2 22  GLUCOSE 265*  BUN 20  CREATININE 1.11*  CALCIUM 9.5  GFRNONAA 50*  ANIONGAP 14    No results for input(s): PROT, ALBUMIN, AST, ALT, ALKPHOS, BILITOT in the last 168 hours. Hematology Recent Labs  Lab 07/24/20 1001  WBC 8.0  RBC 3.80*  HGB 11.5*  HCT 33.6*  MCV 88.4  MCH 30.3  MCHC 34.2  RDW 12.3  PLT 350   BNP Recent Labs  Lab 07/24/20 1001  BNP 63.6    DDimer No results  for input(s): DDIMER in the last 168 hours.   Radiology/Studies:  DG Chest 2 View  Result Date: 07/24/2020 CLINICAL DATA:  Syncope EXAM: CHEST - 2 VIEW COMPARISON:  April 13, 2020 FINDINGS: The cardiomediastinal silhouette is unchanged in contour.Resolution of previously described RIGHT basilar heterogeneous opacities. No pleural effusion. No pneumothorax. No acute pleuroparenchymal abnormality. Visualized abdomen is unremarkable. Multilevel degenerative changes of the thoracic spine. IMPRESSION: Resolution of previously described RIGHT basilar heterogeneous opacities. No new focal consolidation. Electronically Signed   By: Meda Klinefelter MD   On: 07/24/2020 11:17   CT Head Wo Contrast  Result Date: 07/24/2020 CLINICAL DATA:  Hit face EXAM: CT HEAD WITHOUT CONTRAST CT MAXILLOFACIAL WITHOUT CONTRAST TECHNIQUE: Multidetector CT imaging of the head and maxillofacial structures were performed using the standard protocol without intravenous contrast. Multiplanar CT image reconstructions of the maxillofacial structures were also generated. COMPARISON:  June 27, 2011 FINDINGS: CT HEAD FINDINGS Brain: No evidence of acute infarction, hemorrhage, hydrocephalus, extra-axial collection or mass lesion/mass effect. Mildly advanced global parenchymal volume loss for age. Vascular: Vascular calcifications of the carotid siphons. Skull: Normal. Negative for fracture or focal lesion. Other: None. CT MAXILLOFACIAL FINDINGS Osseous: No fracture or mandibular dislocation. No destructive process. Orbits: Negative. No traumatic or inflammatory finding. Sinuses: Mild to moderate mucosal thickening of the RIGHT sphenoid sinus. Soft tissues: Laceration of the RIGHT cheek. IMPRESSION: 1.  No acute intracranial abnormality. 2. No fracture or dislocation of the facial bones. 3. Laceration of the RIGHT cheek. Electronically Signed   By: Meda Klinefelter MD   On: 07/24/2020 11:24   CT Maxillofacial Wo Contrast  Result  Date: 07/24/2020 CLINICAL DATA:  Hit face EXAM: CT HEAD WITHOUT CONTRAST CT MAXILLOFACIAL WITHOUT CONTRAST TECHNIQUE: Multidetector CT imaging of the head and maxillofacial structures were performed using the standard protocol without intravenous contrast. Multiplanar CT image reconstructions of the maxillofacial structures were also generated. COMPARISON:  June 27, 2011 FINDINGS: CT HEAD FINDINGS Brain: No evidence of acute infarction, hemorrhage, hydrocephalus, extra-axial collection or mass lesion/mass effect. Mildly advanced global parenchymal volume loss for age. Vascular: Vascular calcifications of the carotid siphons. Skull: Normal. Negative for fracture or focal lesion. Other: None.  CT MAXILLOFACIAL FINDINGS Osseous: No fracture or mandibular dislocation. No destructive process. Orbits: Negative. No traumatic or inflammatory finding. Sinuses: Mild to moderate mucosal thickening of the RIGHT sphenoid sinus. Soft tissues: Laceration of the RIGHT cheek. IMPRESSION: 1.  No acute intracranial abnormality. 2. No fracture or dislocation of the facial bones. 3. Laceration of the RIGHT cheek. Electronically Signed   By: Meda Klinefelter MD   On: 07/24/2020 11:24     Assessment and Plan:   -Syncope In the setting of new onset atrial fibrillation with RVR rate up to 150 bpm documented in the emergency room Note indicating orthostasis in the ER despite IV fluids -Started on diltiazem infusion for rate control Losartan held CT scan head nonacute  Atrial fibrillation with RVR New onset, difficult to control rate on diltiazem secondary to low blood pressure and orthostasis -If rate continues to run high on diltiazem and oral metoprolol, May need to consider other options including digoxin -If unable to control rate adequately, may need TEE cardioversion -Started on anticoagulation Eliquis 5 twice daily by hospitalist service -For now would continue diltiazem infusion with metoprolol and monitor  rate.  May convert overnight  Essential hypertension We will hold the losartan in the setting of atrial fibrillation with low blood pressure, difficult to control rate Continue the metoprolol with diltiazem Further titration of the diltiazem and metoprolol as blood pressure permits  Diabetes type 2 Hemoglobin A1c 7.0 Managed by primary care   Total encounter time more than 110 minutes  Greater than 50% was spent in counseling and coordination of care with the patient   For questions or updates, please contact [facility] Please consult [email or web address] for contact info under    Signed, Julien Nordmann, MD  07/24/2020 3:06 PM

## 2020-07-24 NOTE — ED Notes (Signed)
  Pt helped to stand at bedside. HR went from 115 p to 145-150. Pt helped back into bed. EDP notified.

## 2020-07-24 NOTE — ED Notes (Signed)
Pt given meal tray.

## 2020-07-24 NOTE — ED Triage Notes (Signed)
Pt to ED via [facility] from home syncopal episode. Per EMS pt stood up to get out of bed and had syncopal episode and hit her head on the night stand. Pt has small laceration to the right cheek. Per EMS is pt c/o nausea. Pt is A & O on arrival. Per EMS pt HR irregular rate 110-140. Pt A & O

## 2020-07-24 NOTE — H&P (Signed)
History and Physical    Karen Murphy EHM:[id number] DOB: 1951/02/14 DOA: 07/24/2020  PCP: Garnette Czech, MD   Patient coming from: Home  I have personally briefly reviewed patient's old medical records in [facility]  Chief Complaint: Syncopal episode  HPI: Karen Murphy is a 69 y.o. female with medical history significant for hypertension, diabetes mellitus and chronic anemia who was brought into the ER via EMS after she had a syncopal episode at home.  Patient attempted to get out of bed when she became diaphoretic, developed palpitations and lightheadedness and woke up on the floor.  She hit her right cheek on the nightstand and has a laceration.  Her friend who she was on the phone with called EMS.  Per EMS she was awake, alert and oriented to person place and time upon their arrival.  She was noted to have an irregular heart rate 110 - 140bpm.  She does not know how long she was down for. She complains of nausea but denies having any chest pain, no diaphoresis, no emesis, no changes in her bowel habits, no fever, no chills, no urinary symptoms, no headache, no cough. She denies having a known history of atrial fibrillation. Twelve-lead EKG done in the ER showed A. fib with rapid ventricular rate with no acute ST-T wave changes. She received 2 doses of IV Cardizem and is currently rate controlled but when she gets up from a supine position she becomes tachycardic. Labs show sodium 132, potassium 3.8, chloride 96, bicarb 22, glucose 265, BUN 20, creatinine 1.1, calcium 9.5, BNP 63, troponin XIV, white count 8.0, hemoglobin 11.5, hematocrit 33.6, MCV 88.4, RDW 12.3, platelet count 350 Respiratory viral panel is negative CT scan of the head without contrast/maxillofacial CT shows no acute intracranial abnormality.  No fracture or dislocation of the facial bones.  Laceration of the right cheek Chest x-ray reviewed by me shows no obvious infiltrate or effusion. Twelve-lead EKG shows  rapid atrial fibrillation   ED Course: Patient is a 69 year old female who presents to the emergency room via EMS after she had a syncopal episode at home.  She was found to be in new onset atrial fibrillation with rapid ventricular rate and received 2 doses of IV Cardizem with improvement in her heart rate. When patient attempts to get up from a supine position she becomes tachycardic again. She has a CHADS2VASC score of 3 and ideally requires anticoagulation as primary prophylaxis for an acute stroke.  She will be admitted to the hospital for further evaluation.  Review of Systems: As per HPI otherwise 10 point review of systems negative.    Past Medical History:  Diagnosis Date  . Anemia   . Arthritis   . Diabetes mellitus without complication (HCC)   . Hypertension     History reviewed. No pertinent surgical history.   reports that she has quit smoking. She has never used smokeless tobacco. No history on file for alcohol use and drug use.  No Known Allergies  No family history on file.   Prior to Admission medications   Medication Sig Start Date End Date Taking? Authorizing Provider  acetaminophen (TYLENOL) 325 MG tablet Take 650 mg by mouth every 6 (six) hours as needed for mild pain or moderate pain.    [provider]  amLODipine (NORVASC) 2.5 MG tablet Take 2.5 mg by mouth every evening. 01/20/20   [provider]  atorvastatin (LIPITOR) 40 MG tablet Take 40 mg by mouth every evening. 01/04/20  [provider]  azithromycin (ZITHROMAX) 500 MG tablet Take 1 tablet (500 mg total) by mouth daily. 04/16/20   Enedina Finner, MD  Calcium Carbonate-Vitamin D3 (CALCIUM 600/VITAMIN D) 600-400 MG-UNIT TABS Take 1 tablet by mouth daily.    [provider]  glipiZIDE (GLUCOTROL XL) 10 MG 24 hr tablet Take 20 mg by mouth daily. 02/03/20   [provider]  hydrochlorothiazide (HYDRODIURIL) 25 MG tablet Take 25 mg by mouth daily. 10/19/19   [provider]  Lactobacillus-Inulin (PROBIOTIC DIGESTIVE SUPPORT) CAPS Take 1 capsule by mouth daily.    [provider]  losartan (COZAAR) 100 MG tablet Take 100 mg by mouth daily. 03/21/20   [provider]  magnesium oxide (MAG-OX) 400 MG tablet Take 400-800 mg by mouth See admin instructions. Take 2 tablets (800mg ) by mouth every morning, 1 tablet (400mg ) by mouth at midday and take 1 tablet (400mg ) by mouth every evening 12/08/19   [provider]  metFORMIN (GLUCOPHAGE-XR) 500 MG 24 hr tablet Take 1,500 mg by mouth daily with supper. 10/19/19   [provider]  metoprolol tartrate (LOPRESSOR) 50 MG tablet Take 50 mg by mouth 2 (two) times daily. 01/04/20   [provider]  naproxen sodium (ALEVE) 220 MG tablet Take 220 mg by mouth every 12 (twelve) hours as needed (pain).    [provider]  ondansetron (ZOFRAN) 8 MG tablet Take 8 mg by mouth every 8 (eight) hours as needed for nausea or vomiting. 01/05/20   [provider]  OZEMPIC, 0.25 OR 0.5 MG/DOSE, 2 MG/1.5ML SOPN Inject 0.5 mg into the skin every Monday. 03/22/20   [provider]  TOUJEO SOLOSTAR 300 UNIT/ML Solostar Pen Inject 18 Units into the skin at bedtime. 01/04/20   [provider]    Physical Exam: Vitals:   07/24/20 0952 07/24/20 1000 07/24/20 1030  BP: (!) 142/92 (!) 147/66 (!) 102/58  Pulse: 78 (!) 144 80  Resp: 16 11 17   SpO2: 98% 96% 97%     Vitals:   07/24/20 0952 07/24/20 1000 07/24/20 1030  BP: (!) 142/92 (!) 147/66 (!) 102/58  Pulse: 78 (!) 144 80  Resp: 16 11 17   SpO2: 98% 96% 97%    Constitutional: NAD, alert and oriented x 3 Eyes: PERRL, lids and conjunctivae normal ENMT: Mucous membranes are moist. Laceration over right cheek (POA) Neck: normal, supple, no masses, no thyromegaly Respiratory: clear to auscultation bilaterally, no wheezing, no crackles. Normal respiratory effort. No accessory muscle use.  Cardiovascular:  Irregularly irregular, tachycardic no murmurs / rubs / gallops. No extremity edema. 2+ pedal pulses. No carotid bruits.  Abdomen: no tenderness, no masses palpated. No hepatosplenomegaly. Bowel sounds positive.  Musculoskeletal: no clubbing / cyanosis. No joint deformity upper and lower extremities.  Skin: no rashes, lesions, ulcers.  Neurologic: No gross focal neurologic deficit. Psychiatric: Normal mood and affect.   Labs on Admission: I have personally reviewed following labs and imaging studies  CBC: Recent Labs  Lab 07/24/20 1001  WBC 8.0  HGB 11.5*  HCT 33.6*  MCV 88.4  PLT 350   Basic Metabolic Panel: Recent Labs  Lab 07/24/20 1001  NA 132*  K 3.8  CL 96*  CO2 22  GLUCOSE 265*  BUN 20  CREATININE 1.11*  CALCIUM 9.5  MG 1.7   GFR: CrCl cannot be calculated (Unknown ideal weight.). Liver Function Tests: No results for input(s): AST, ALT, ALKPHOS, BILITOT, PROT, ALBUMIN in the last 168 hours. No results  for input(s): LIPASE, AMYLASE in the last 168 hours. No results for input(s): AMMONIA in the last 168 hours. Coagulation Profile: No results for input(s): INR, PROTIME in the last 168 hours. Cardiac Enzymes: No results for input(s): CKTOTAL, CKMB, CKMBINDEX, TROPONINI in the last 168 hours. BNP (last 3 results) No results for input(s): PROBNP in the last 8760 hours. HbA1C: No results for input(s): HGBA1C in the last 72 hours. CBG: No results for input(s): GLUCAP in the last 168 hours. Lipid Profile: No results for input(s): CHOL, HDL, LDLCALC, TRIG, CHOLHDL, LDLDIRECT in the last 72 hours. Thyroid Function Tests: No results for input(s): TSH, T4TOTAL, FREET4, T3FREE, THYROIDAB in the last 72 hours. Anemia Panel: No results for input(s): VITAMINB12, FOLATE, FERRITIN, TIBC, IRON, RETICCTPCT in the last 72 hours. Urine analysis: No results found for: COLORURINE, APPEARANCEUR, LABSPEC, PHURINE, GLUCOSEU, HGBUR, BILIRUBINUR, KETONESUR, PROTEINUR, UROBILINOGEN,  NITRITE, LEUKOCYTESUR  Radiological Exams on Admission: DG Chest 2 View  Result Date: 07/24/2020 CLINICAL DATA:  Syncope EXAM: CHEST - 2 VIEW COMPARISON:  April 13, 2020 FINDINGS: The cardiomediastinal silhouette is unchanged in contour.Resolution of previously described RIGHT basilar heterogeneous opacities. No pleural effusion. No pneumothorax. No acute pleuroparenchymal abnormality. Visualized abdomen is unremarkable. Multilevel degenerative changes of the thoracic spine. IMPRESSION: Resolution of previously described RIGHT basilar heterogeneous opacities. No new focal consolidation. Electronically Signed   By: Meda Klinefelter MD   On: 07/24/2020 11:17   CT Head Wo Contrast  Result Date: 07/24/2020 CLINICAL DATA:  Hit face EXAM: CT HEAD WITHOUT CONTRAST CT MAXILLOFACIAL WITHOUT CONTRAST TECHNIQUE: Multidetector CT imaging of the head and maxillofacial structures were performed using the standard protocol without intravenous contrast. Multiplanar CT image reconstructions of the maxillofacial structures were also generated. COMPARISON:  June 27, 2011 FINDINGS: CT HEAD FINDINGS Brain: No evidence of acute infarction, hemorrhage, hydrocephalus, extra-axial collection or mass lesion/mass effect. Mildly advanced global parenchymal volume loss for age. Vascular: Vascular calcifications of the carotid siphons. Skull: Normal. Negative for fracture or focal lesion. Other: None. CT MAXILLOFACIAL FINDINGS Osseous: No fracture or mandibular dislocation. No destructive process. Orbits: Negative. No traumatic or inflammatory finding. Sinuses: Mild to moderate mucosal thickening of the RIGHT sphenoid sinus. Soft tissues: Laceration of the RIGHT cheek. IMPRESSION: 1.  No acute intracranial abnormality. 2. No fracture or dislocation of the facial bones. 3. Laceration of the RIGHT cheek. Electronically Signed   By: Meda Klinefelter MD   On: 07/24/2020 11:24   CT Maxillofacial Wo Contrast  Result Date:  07/24/2020 CLINICAL DATA:  Hit face EXAM: CT HEAD WITHOUT CONTRAST CT MAXILLOFACIAL WITHOUT CONTRAST TECHNIQUE: Multidetector CT imaging of the head and maxillofacial structures were performed using the standard protocol without intravenous contrast. Multiplanar CT image reconstructions of the maxillofacial structures were also generated. COMPARISON:  June 27, 2011 FINDINGS: CT HEAD FINDINGS Brain: No evidence of acute infarction, hemorrhage, hydrocephalus, extra-axial collection or mass lesion/mass effect. Mildly advanced global parenchymal volume loss for age. Vascular: Vascular calcifications of the carotid siphons. Skull: Normal. Negative for fracture or focal lesion. Other: None. CT MAXILLOFACIAL FINDINGS Osseous: No fracture or mandibular dislocation. No destructive process. Orbits: Negative. No traumatic or inflammatory finding. Sinuses: Mild to moderate mucosal thickening of the RIGHT sphenoid sinus. Soft tissues: Laceration of the RIGHT cheek. IMPRESSION: 1.  No acute intracranial abnormality. 2. No fracture or dislocation of the facial bones. 3. Laceration of the RIGHT cheek. Electronically Signed   By: Meda Klinefelter MD   On: 07/24/2020 11:24    EKG: Independently reviewed.  Atrial fibrillation with rapid ventricular rate  Assessment/Plan Principal Problem:   Atrial fibrillation with rapid ventricular response (HCC) Active Problems:   Type 2 diabetes mellitus without complication, with long-term current use of insulin (HCC)   Essential hypertension   Syncope and collapse      Atrial fibrillation with rapid ventricular rate New onset Patient found to be in rapid A. fib and received a dose of IV Cardizem in the emergency room with improvement in heart rate She has a CHADS2VASC score of 3 and ideally requires long-term anticoagulation as primary prophylaxis for an acute stroke Will start patient on apixaban 5 mg twice daily Start patient on a Cardizem drip to optimize rate  control Continue metoprolol 50 mg twice daily for rate control We will obtain TSH  Obtain 2D echocardiogram to assess LVEF and rule out valvular pathology Obtain serial cardiac enzymes to rule out acute coronary syndrome We will consult cardiology   Syncope and collapse Unclear etiology and may be related new onset rapid A. Fib Orthostatic blood pressure checks Obtain 2D echocardiogram to assess for aortic stenosis Cardiac monitoring over the next 24 to 48 hours to rule out any other arrhythmias   Type 2 diabetes mellitus Maintain consistent carbohydrate diet Glycemic control with insulin   Hypertension Continue Cozaar and metoprolol   Diabetes mellitus Maintain consistent carbohydrate diet Place patient on sliding scale insulin for glycemic control   DVT prophylaxis: Apixaban Code Status: Full code Family Communication: Greater than 50% of time was spent discussing plan of care with patient at the bedside.  All questions and concerns have been addressed.  She verbalizes understanding and agrees with the plan. Disposition Plan: Back to previous home environment Consults called: Cardiology    Karen Spillman MD [facility]     07/24/2020, 1:08 PM

## 2020-07-24 NOTE — ED Notes (Signed)
Pt transported to CT ?

## 2020-07-24 NOTE — ED Notes (Signed)
Attending provider at bedside

## 2020-07-24 NOTE — ED Notes (Signed)
Pt monitor alarming for increased HR. RN in room, pt states that she sat up to eat. Pts HR between 120's-140s. Pt HR very sensitive to movement. Pt is in NAD at this time. Attending MD sent secure chat about pts HR. Waiting new orders, RN will continue to monitor.

## 2020-07-24 NOTE — ED Provider Notes (Signed)
[facility] Emergency Department Provider Note  ____________________________________________   First MD Initiated Contact with Patient 07/24/20 [phone]     (approximate)  I have reviewed the triage vital signs and the nursing notes.   HISTORY  Chief Complaint Loss of Consciousness    HPI Karen Murphy is a 69 y.o. female with history of hypertension, diabetes, here with syncope.  Patient states that she woke up this morning and felt like she was having some heart palpitations/fluttering in her chest.  She thought she had some heartburn as well.  She states her friend called, and she stood up to start getting ready for the day.  She then began to feel very lightheaded, weak, and next thing she remembers, she woke up on the ground.  She struck her right cheek on a nearby piece of furniture.  She does not know how long she was down for.  She states she has some minimal pain along her right cheek when she presses it, but otherwise no significant headache or neck pain.  No focal numbness or weakness.  No chest pain.  She does continue to feel some palpitations.  Denies known history of A. fib.  No other medical complaints or recent illnesses.  No recent medication changes.  She does note that she has had somewhat decreased appetite recently, but is adamant that she drinks plenty of water.  No other complaints.        Past Medical History:  Diagnosis Date  . Anemia   . Arthritis   . Diabetes mellitus without complication (HCC)   . Hypertension     Patient Active Problem List   Diagnosis Date Noted  . Community acquired pneumonia of right lower lobe of lung   . Essential hypertension   . Acute respiratory failure with hypoxia (HCC) 04/13/2020  . Lobar pneumonia (HCC)   . Type 2 diabetes mellitus without complication, with long-term current use of insulin (HCC)     History reviewed. No pertinent surgical history.  Prior to Admission medications   Medication Sig  Start Date End Date Taking? Authorizing Provider  acetaminophen (TYLENOL) 325 MG tablet Take 650 mg by mouth every 6 (six) hours as needed for mild pain or moderate pain.    [provider]  amLODipine (NORVASC) 2.5 MG tablet Take 2.5 mg by mouth every evening. 01/20/20   [provider]  atorvastatin (LIPITOR) 40 MG tablet Take 40 mg by mouth every evening. 01/04/20   [provider]  azithromycin (ZITHROMAX) 500 MG tablet Take 1 tablet (500 mg total) by mouth daily. 04/16/20   Enedina Finner, MD  Calcium Carbonate-Vitamin D3 (CALCIUM 600/VITAMIN D) 600-400 MG-UNIT TABS Take 1 tablet by mouth daily.    [provider]  glipiZIDE (GLUCOTROL XL) 10 MG 24 hr tablet Take 20 mg by mouth daily. 02/03/20   [provider]  hydrochlorothiazide (HYDRODIURIL) 25 MG tablet Take 25 mg by mouth daily. 10/19/19   [provider]  Lactobacillus-Inulin (PROBIOTIC DIGESTIVE SUPPORT) CAPS Take 1 capsule by mouth daily.    [provider]  losartan (COZAAR) 100 MG tablet Take 100 mg by mouth daily. 03/21/20   [provider]  magnesium oxide (MAG-OX) 400 MG tablet Take 400-800 mg by mouth See admin instructions. Take 2 tablets (800mg ) by mouth every morning, 1 tablet (400mg ) by mouth at midday and take 1 tablet (400mg ) by mouth every evening 12/08/19   [provider]  metFORMIN (GLUCOPHAGE-XR) 500 MG 24 hr tablet  Take 1,500 mg by mouth daily with supper. 10/19/19   [provider]  metoprolol tartrate (LOPRESSOR) 50 MG tablet Take 50 mg by mouth 2 (two) times daily. 01/04/20   [provider]  naproxen sodium (ALEVE) 220 MG tablet Take 220 mg by mouth every 12 (twelve) hours as needed (pain).    [provider]  ondansetron (ZOFRAN) 8 MG tablet Take 8 mg by mouth every 8 (eight) hours as needed for nausea or vomiting. 01/05/20   [provider]  OZEMPIC, 0.25 OR 0.5 MG/DOSE, 2 MG/1.5ML SOPN Inject 0.5 mg into the skin  every Monday. 03/22/20   [provider]  TOUJEO SOLOSTAR 300 UNIT/ML Solostar Pen Inject 18 Units into the skin at bedtime. 01/04/20   [provider]    Allergies Patient has no known allergies.  No family history on file.  Social History Social History   Tobacco Use  . Smoking status: Former Games developer  . Smokeless tobacco: Never Used  Substance Use Topics  . Alcohol use: Not on file  . Drug use: Not on file    Review of Systems  Review of Systems  Constitutional: Positive for fatigue. Negative for fever.  HENT: Negative for congestion and sore throat.   Eyes: Negative for visual disturbance.  Respiratory: Negative for cough and shortness of breath.   Cardiovascular: Positive for palpitations. Negative for chest pain.  Gastrointestinal: Negative for abdominal pain, diarrhea, nausea and vomiting.  Genitourinary: Negative for flank pain.  Musculoskeletal: Negative for back pain and neck pain.  Skin: Negative for rash and wound.  Neurological: Positive for syncope and weakness.  All other systems reviewed and are negative.    ____________________________________________  PHYSICAL EXAM:      VITAL SIGNS: ED Triage Vitals  Enc Vitals Group     BP 07/24/20 0952 (!) 142/92     Pulse Rate 07/24/20 0952 78     Resp 07/24/20 0952 16     Temp --      Temp src --      SpO2 07/24/20 0952 98 %     Weight --      Height --      Head Circumference --      Peak Flow --      Pain Score 07/24/20 0953 5     Pain Loc --      Pain Edu? --      Excl. in GC? --      Physical Exam Vitals and nursing note reviewed.  Constitutional:      General: She is not in acute distress.    Appearance: She is well-developed.  HENT:     Head: Normocephalic and atraumatic.     Comments: Superficial abrasion to the right cheek.  No deep lacerations.  Mild surrounding tenderness without deformity. Eyes:     Conjunctiva/sclera: Conjunctivae normal.  Cardiovascular:     Rate  and Rhythm: Tachycardia present. Rhythm irregularly irregular.     Heart sounds: Normal heart sounds. No murmur heard.  No friction rub.  Pulmonary:     Effort: Pulmonary effort is normal. No respiratory distress.     Breath sounds: Normal breath sounds. No wheezing or rales.  Abdominal:     General: There is no distension.     Palpations: Abdomen is soft.     Tenderness: There is no abdominal tenderness.  Musculoskeletal:     Cervical back: Neck supple.  Skin:    General: Skin is warm.  Capillary Refill: Capillary refill takes less than 2 seconds.  Neurological:     Mental Status: She is alert and oriented to person, place, and time.     Motor: No abnormal muscle tone.       ____________________________________________   LABS (all labs ordered are listed, but only abnormal results are displayed)  Labs Reviewed  BASIC METABOLIC PANEL - Abnormal; Notable for the following components:      Result Value   Sodium 132 (*)    Chloride 96 (*)    Glucose, Bld 265 (*)    Creatinine, Ser 1.11 (*)    GFR, Estimated 50 (*)    All other components within normal limits  CBC - Abnormal; Notable for the following components:   RBC 3.80 (*)    Hemoglobin 11.5 (*)    HCT 33.6 (*)    All other components within normal limits  RESPIRATORY PANEL BY RT PCR (FLU A&B, COVID)  MAGNESIUM  BRAIN NATRIURETIC PEPTIDE  URINALYSIS, COMPLETE (UACMP) WITH MICROSCOPIC  CBG MONITORING, ED  TROPONIN I (HIGH SENSITIVITY)    ____________________________________________  EKG: Atrial fibrillation, ventricular rate 133.  QRS 92, QTc 460.  No acute ST elevations or depressions. ________________________________________  RADIOLOGY All imaging, including plain films, CT scans, and ultrasounds, independently reviewed by me, and interpretations confirmed via formal radiology reads.  ED MD interpretation:   CT head: no acute abnormality CT face:no apparent facial fx Chest x-ray: no new consolidation,  no ptx or htx  Official radiology report(s): DG Chest 2 View  Result Date: 07/24/2020 CLINICAL DATA:  Syncope EXAM: CHEST - 2 VIEW COMPARISON:  April 13, 2020 FINDINGS: The cardiomediastinal silhouette is unchanged in contour.Resolution of previously described RIGHT basilar heterogeneous opacities. No pleural effusion. No pneumothorax. No acute pleuroparenchymal abnormality. Visualized abdomen is unremarkable. Multilevel degenerative changes of the thoracic spine. IMPRESSION: Resolution of previously described RIGHT basilar heterogeneous opacities. No new focal consolidation. Electronically Signed   By: Meda Klinefelter MD   On: 07/24/2020 11:17    ____________________________________________  PROCEDURES   Procedure(s) performed (including Critical Care):  Procedures  ____________________________________________  INITIAL IMPRESSION / MDM / ASSESSMENT AND PLAN / ED COURSE  As part of my medical decision making, I reviewed the following data within the electronic MEDICAL RECORD NUMBER Nursing notes reviewed and incorporated, Old chart reviewed, Notes from prior ED visits, and [place] Controlled Substance Database       *Rosebella Nieves was evaluated in Emergency Department on 07/24/2020 for the symptoms described in the history of present illness. She was evaluated in the context of the global COVID-19 pandemic, which necessitated consideration that the patient might be at risk for infection with the SARS-CoV-2 virus that causes COVID-19. Institutional protocols and algorithms that pertain to the evaluation of patients at risk for COVID-19 are in a state of rapid change based on information released by regulatory bodies including the CDC and federal and state organizations. These policies and algorithms were followed during the patient's care in the ED.  Some ED evaluations and interventions may be delayed as a result of limited staffing during the pandemic.*     Medical Decision Making:  69 yo F  here with new onset afib RVR, improved with dilt but persistently orthostatic with HR 130-140s w/ standing only. Labs overall unremarkable with exception of mild hyperglycemia. Lytes o/w wnl. Trop neg. Mag wnl. BNP normal. CT Head/Face negative for fx, reviewed by me. CXR reviewed by me and unremarkable. Admit for management of sx afib  rvr.  CHA2Ds2-VASc Score for Atrial Fibrillation    Patient Score  Age <65 = 0 65-74 = 1 > 75 = 2 1  Sex Female = 0 Female = 1 1  CHF History No = 0  Yes = 1 0  HTN History No = 0  Yes = 1 1  Stroke/TIA/TE History No = 0  Yes = 1 0  Vascular Disease History No = 0  Yes = 1 0  Diabetes History No = 0  Yes = 1 1  Total:  3   3.2 % stroke rate/year from a score of 3  ____________________________________________  FINAL CLINICAL IMPRESSION(S) / ED DIAGNOSES  Final diagnoses:  New onset atrial fibrillation (HCC)     MEDICATIONS GIVEN DURING THIS VISIT:  Medications  bacitracin ointment ( Topical Given 07/24/20 1017)  diltiazem (CARDIZEM) 25 MG/5ML injection (has no administration in time range)  diltiazem (CARDIZEM) injection 10 mg (has no administration in time range)  sodium chloride 0.9 % bolus 1,000 mL (has no administration in time range)  diltiazem (CARDIZEM) injection 10 mg (10 mg Intravenous Given 07/24/20 1007)  Tdap (BOOSTRIX) injection 0.5 mL (0.5 mLs Intramuscular Given 07/24/20 1016)     ED Discharge Orders    None       Note:  This document was prepared using Dragon voice recognition software and may include unintentional dictation errors.   Shaune Pollack, MD 07/24/20 1123

## 2020-07-24 NOTE — ED Notes (Signed)
Dr Gollan at bedside °

## 2020-07-24 NOTE — ED Notes (Signed)
Pt back from CT

## 2020-07-24 NOTE — ED Notes (Signed)
Pt had syncopal episode at home while standing.

## 2020-07-24 NOTE — ED Notes (Signed)
Helped to sit at side of bed per EDP request to evaluate HR with sitting and standing. Pt currently sitting on side of bed with legs dangling. HR 95-115 on monitor. Pt denies dizziness and SOB with sitting.

## 2020-07-25 ENCOUNTER — Inpatient Hospital Stay (HOSPITAL_BASED_OUTPATIENT_CLINIC_OR_DEPARTMENT_OTHER)
Admit: 2020-07-25 | Discharge: 2020-07-25 | Disposition: A | Discharge status: Home / Self Care | Payer: Medicare Other | Attending: Cardiovascular Disease | Admitting: Cardiovascular Disease

## 2020-07-25 ENCOUNTER — Ambulatory Visit (INDEPENDENT_AMBULATORY_CARE_PROVIDER_SITE_OTHER): Payer: Medicare Other

## 2020-07-25 DIAGNOSIS — I480 Paroxysmal atrial fibrillation: Secondary | ICD-10-CM | POA: Diagnosis not present

## 2020-07-25 DIAGNOSIS — I4891 Unspecified atrial fibrillation: Secondary | ICD-10-CM | POA: Diagnosis not present

## 2020-07-25 DIAGNOSIS — R55 Syncope and collapse: Secondary | ICD-10-CM | POA: Diagnosis not present

## 2020-07-25 LAB — GLUCOSE, CAPILLARY
Glucose-Capillary: 152 mg/dL — ABNORMAL HIGH (ref 70–99)
Glucose-Capillary: 265 mg/dL — ABNORMAL HIGH (ref 70–99)
Glucose-Capillary: 180 mg/dL — ABNORMAL HIGH (ref 70–99)

## 2020-07-25 LAB — ECHOCARDIOGRAM COMPLETE
AR max vel: 1.9 cm2
AV Area VTI: 1.86 cm2
AV Area mean vel: 2.06 cm2
AV Mean grad: 4 mmHg
AV Peak grad: 8.4 mmHg
Ao pk vel: 1.45 m/s
Area-P 1/2: 2.99 cm2
Height: 66 in
S' Lateral: 2.11 cm
Weight: 3265.6 oz

## 2020-07-25 MED ORDER — DILTIAZEM HCL ER COATED BEADS 120 MG PO CP24: 120.0000 mg | ORAL_CAPSULE | Freq: Every day | ORAL | Status: DC

## 2020-07-25 MED ORDER — DILTIAZEM HCL ER COATED BEADS 120 MG PO CP24
120.0000 mg | ORAL_CAPSULE | Freq: Every day | ORAL | 1 refills | Status: DC
Start: 2020-07-25 — End: 2020-07-29

## 2020-07-25 MED ORDER — PERFLUTREN LIPID MICROSPHERE
1.0000 mL | INTRAVENOUS | Status: AC | PRN
  Administered 2020-07-25: 2 mL via INTRAVENOUS
  Filled 2020-07-25: qty 10

## 2020-07-25 MED ORDER — APIXABAN 5 MG PO TABS
5.0000 mg | ORAL_TABLET | Freq: Two times a day (BID) | ORAL | 1 refills | Status: DC
Start: 2020-07-25 — End: 2020-07-29

## 2020-07-25 MED ORDER — LOSARTAN POTASSIUM 100 MG PO TABS
50.0000 mg | ORAL_TABLET | Freq: Every day | ORAL | 0 refills | Status: DC
Start: 2020-07-25 — End: 2020-09-14

## 2020-07-25 NOTE — Progress Notes (Signed)
Mobility Specialist - Progress Note   07/25/20 1614  Mobility  Activity Ambulated to bathroom  Level of Assistance Minimal assist, patient does 75% or more  Assistive Device Front wheel walker  Distance Ambulated (ft) 20 ft  Mobility Response Tolerated well  Mobility performed by Mobility specialist  $Mobility charge 1 Mobility     Pre-mobility: 96 HR During mobility: 101 HR, 96% SpO2 Post-mobility: 81 HR, 100% SpO2   Pt was sitting in bed upon arrival with friend present in room. Pt agreed to session. Pt stated that she needed assistance getting to bathroom for urinal output. Pt was able to get EOB with supervision and was modA in STS. Pt was minA during ambulation as she was PWB on R foot. VC were needed for foot placement to demonstrate safe gait that aligns with weight-bearing guidelines. Overall, pt tolerated session well. Pt c/o pain in R LE, rating it a "10" post-session. Nurse and NT entered room at the end of session and was notified of performance.    Filiberto Pinks Mobility Specialist 07/25/20, 4:25 PM

## 2020-07-25 NOTE — Discharge Summary (Signed)
[facility] [place] [facility]   PATIENT NAME: Karen Murphy    MR#:  [id number]  DATE OF BIRTH:  07/23/1951  DATE OF ADMISSION:  07/24/2020 ADMITTING PHYSICIAN: Lucile Shutters, MD  DATE OF DISCHARGE: 07/25/2020  PRIMARY CARE PHYSICIAN: Garnette Czech, MD    ADMISSION DIAGNOSIS:  Syncope and collapse [R55] New onset atrial fibrillation (HCC) [I48.91] Atrial fibrillation with rapid ventricular response (HCC) [I48.91]  DISCHARGE DIAGNOSIS:  syncope suspected due to new onset a fib a fib with RVR-- new onset, patient now in sinus rhythm  SECONDARY DIAGNOSIS:   Past Medical History:  Diagnosis Date  . Anemia   . Arthritis   . Diabetes mellitus without complication (HCC)   . Hypertension     HOSPITAL COURSE:  Karen Murphy is a 69 y.o. female with medical history significant for hypertension, diabetes mellitus and chronic anemia who was brought into the ER via EMS after she had a syncopal episode at home.  Patient attempted to get out of bed when she became diaphoretic, developed palpitations and lightheadedness and woke up on the floor.  She hit her right cheek on the nightstand and has a laceration.  Her friend who she was on the phone with called EMS.   # Atrial fibrillation with rapid ventricular rate New onset -Patient found to be in rapid A. fib and received a dose of IV Cardizem in the emergency room with improvement in heart rate -She has a CHADS2VASC score of 3 on apixaban 5 mg twice daily -Started patient on a Cardizem drip to optimize rate control-- now weaned off -Continue metoprolol 50 mg twice daily for rate control, diltiazem CD 120 daily - TSH .746 -2D echocardiogram shows EF of 60 to 65%. No regional wall motion abnormality. Right ventricular normal. Mitral valve, aortic valve normal. -Cardiology consultation with Dr. Kirke Corin. Continue metoprolol, diltiazem and decrease losartan to 50 mg. Continue eliquis. Stop amlodipine and  hydrochlorothiazide. -- Patient will get Holter monitor placed by cardiology.  Syncope and collapse Unclear etiology and may be related new onset rapid A. Fib Pt in sinus rhythm. vital signs stable.  Type 2 diabetes mellitus, with hyperglycemia on insulin Maintain consistent carbohydrate diet Glycemic control with insulin and PO meds  Hypertension Continue Cozaar and metoprolol added diltiazem  overall patient hemodynamically stable. Okay from cardiology standpoint for discharge. DVT prophylaxis: Apixaban Code Status: Full code Family Communication: Greater than 50% of time was spent discussing plan of care with patient at the bedside.  All questions and concerns have been addressed.  She verbalizes understanding and agrees with the plan. Disposition Plan: Back to previous home environment Consults called: Cardiology  CONSULTS OBTAINED:  Treatment Team:  Antonieta Iba, MD  DRUG ALLERGIES:  No Known Allergies  DISCHARGE MEDICATIONS:   Allergies as of 07/25/2020   No Known Allergies     Medication List    TAKE these medications   acetaminophen 325 MG tablet Commonly known as: TYLENOL Take 650 mg by mouth every 6 (six) hours as needed for mild pain or moderate pain.   apixaban 5 MG Tabs tablet Commonly known as: ELIQUIS Take 1 tablet (5 mg total) by mouth 2 (two) times daily.   ascorbic acid 500 MG tablet Commonly known as: VITAMIN C Take 500 mg by mouth daily.   atorvastatin 40 MG tablet Commonly known as: LIPITOR Take 40 mg by mouth every evening.   Calcium 600/Vitamin D 600-400 MG-UNIT Tabs Generic drug: Calcium Carbonate-Vitamin D3 Take 1  tablet by mouth daily.   diltiazem 120 MG 24 hr capsule Commonly known as: CARDIZEM CD Take 1 capsule (120 mg total) by mouth daily.   glipiZIDE 10 MG 24 hr tablet Commonly known as: GLUCOTROL XL Take 20 mg by mouth daily.   losartan 100 MG tablet Commonly known as: COZAAR Take 0.5 tablets (50 mg  total) by mouth daily. What changed: how much to take   metFORMIN 500 MG 24 hr tablet Commonly known as: GLUCOPHAGE-XR Take 1,500 mg by mouth daily with supper.   metoprolol tartrate 50 MG tablet Commonly known as: LOPRESSOR Take 50 mg by mouth 2 (two) times daily.   naproxen sodium 220 MG tablet Commonly known as: ALEVE Take 220 mg by mouth every 12 (twelve) hours as needed (pain).   Ozempic (0.25 or 0.5 MG/DOSE) 2 MG/1.5ML Sopn Generic drug: Semaglutide(0.25 or 0.5MG /DOS) Inject 0.5 mg into the skin every Monday.   Probiotic Digestive Support Caps Take 1 capsule by mouth daily.   Toujeo SoloStar 300 UNIT/ML Solostar Pen Generic drug: insulin glargine (1 Unit Dial) Inject 18 Units into the skin at bedtime.       If you experience worsening of your admission symptoms, develop shortness of breath, life threatening emergency, suicidal or homicidal thoughts you must seek medical attention immediately by calling 911 or calling your MD immediately  if symptoms less severe.  You Must read complete instructions/literature along with all the possible adverse reactions/side effects for all the Medicines you take and that have been prescribed to you. Take any new Medicines after you have completely understood and accept all the possible adverse reactions/side effects.   Please note  You were cared for by a hospitalist during your hospital stay. If you have any questions about your discharge medications or the care you received while you were in the hospital after you are discharged, you can call the unit and asked to speak with the hospitalist on call if the hospitalist that took care of you is not available. Once you are discharged, your primary care physician will handle any further medical issues. Please note that NO REFILLS for any discharge medications will be authorized once you are discharged, as it is imperative that you return to your primary care physician (or establish a  relationship with a primary care physician if you do not have one) for your aftercare needs so that they can reassess your need for medications and monitor your lab values. Today   SUBJECTIVE   Feeling well. No new complaints. No family in the room  VITAL SIGNS:  Blood pressure (!) 151/76, pulse 75, temperature 98.3 F (36.8 C), temperature source Oral, resp. rate 18, height  (1.676 m), weight 92.6 kg, SpO2 97 %.  I/O:    Intake/Output Summary (Last 24 hours) at 07/25/2020 1552 Last data filed at 07/25/2020 1404 Gross per 24 hour  Intake 11.97 ml  Output 1400 ml  Net -1388.03 ml    PHYSICAL EXAMINATION:  GENERAL:  69 y.o.-year-old patient lying in the bed with no acute distress.  HEENT: Head atraumatic, normocephalic. Oropharynx and nasopharynx clear. Mild skin care right cheek. No bleeding. NECK:  Supple, no jugular venous distention. No thyroid enlargement, no tenderness.  LUNGS: Normal breath sounds bilaterally, no wheezing, rales,rhonchi or crepitation. No use of accessory muscles of respiration.  CARDIOVASCULAR: S1, S2 normal. No murmurs, rubs, or gallops.  ABDOMEN: Soft, non-tender, non-distended. Bowel sounds present. No organomegaly or mass.  EXTREMITIES: No pedal edema, cyanosis, or clubbing.  NEUROLOGIC: Cranial nerves II through XII are intact. Muscle strength 5/5 in all extremities. Sensation intact. Gait not checked.  PSYCHIATRIC: The patient is alert and oriented x 3.  SKIN: No obvious rash, lesion, or ulcer.   DATA REVIEW:   CBC  Recent Labs  Lab 07/24/20 1001  WBC 8.0  HGB 11.5*  HCT 33.6*  PLT 350    Chemistries  Recent Labs  Lab 07/24/20 1001  NA 132*  K 3.8  CL 96*  CO2 22  GLUCOSE 265*  BUN 20  CREATININE 1.11*  CALCIUM 9.5  MG 1.7    Microbiology Results   Recent Results (from the past 240 hour(s))  Respiratory Panel by RT PCR (Flu A&B, Covid) - Nasopharyngeal Swab     Status: None   Collection Time: 07/24/20 11:32 AM    Specimen: Nasopharyngeal Swab  Result Value Ref Range Status   SARS Coronavirus 2 by RT PCR NEGATIVE NEGATIVE Final    Comment: (NOTE) SARS-CoV-2 target nucleic acids are NOT DETECTED.  The SARS-CoV-2 RNA is generally detectable in upper respiratoy specimens during the acute phase of infection. The lowest concentration of SARS-CoV-2 viral copies this assay can detect is 131 copies/mL. A negative result does not preclude SARS-Cov-2 infection and should not be used as the sole basis for treatment or other patient management decisions. A negative result may occur with  improper specimen collection/handling, submission of specimen other than nasopharyngeal swab, presence of viral mutation(s) within the areas targeted by this assay, and inadequate number of viral copies (<131 copies/mL). A negative result must be combined with clinical observations, patient history, and epidemiological information. The expected result is Negative.  Fact Sheet for Patients:  https://www.moore.com/  Fact Sheet for Healthcare Providers:  https://www.young.biz/  This test is no t yet approved or cleared by the [place] FDA and  has been authorized for detection and/or diagnosis of SARS-CoV-2 by FDA under an Emergency Use Authorization (EUA). This EUA will remain  in effect (meaning this test can be used) for the duration of the COVID-19 declaration under Section 564(b)(1) of the Act, 21 U.S.C. section 360bbb-3(b)(1), unless the authorization is terminated or revoked sooner.     Influenza A by PCR NEGATIVE NEGATIVE Final   Influenza B by PCR NEGATIVE NEGATIVE Final    Comment: (NOTE) The Xpert Xpress SARS-CoV-2/FLU/RSV assay is intended as an aid in  the diagnosis of influenza from Nasopharyngeal swab specimens and  should not be used as a sole basis for treatment. Nasal washings and  aspirates are unacceptable for Xpert Xpress SARS-CoV-2/FLU/RSV   testing.  Fact Sheet for Patients: https://www.moore.com/  Fact Sheet for Healthcare Providers: https://www.young.biz/  This test is not yet approved or cleared by the [place] FDA and  has been authorized for detection and/or diagnosis of SARS-CoV-2 by  FDA under an Emergency Use Authorization (EUA). This EUA will remain  in effect (meaning this test can be used) for the duration of the  Covid-19 declaration under Section 564(b)(1) of the Act, 21  U.S.C. section 360bbb-3(b)(1), unless the authorization is  terminated or revoked. Performed at [facility], [address] [place] [place], [place] [address]     RADIOLOGY:  DG Chest 2 View  Result Date: 07/24/2020 CLINICAL DATA:  Syncope EXAM: CHEST - 2 VIEW COMPARISON:  April 13, 2020 FINDINGS: The cardiomediastinal silhouette is unchanged in contour.Resolution of previously described RIGHT basilar heterogeneous opacities. No pleural effusion. No pneumothorax. No acute pleuroparenchymal abnormality. Visualized abdomen is unremarkable. Multilevel degenerative  changes of the thoracic spine. IMPRESSION: Resolution of previously described RIGHT basilar heterogeneous opacities. No new focal consolidation. Electronically Signed   By: Meda Klinefelter MD   On: 07/24/2020 11:17   CT Head Wo Contrast  Result Date: 07/24/2020 CLINICAL DATA:  Hit face EXAM: CT HEAD WITHOUT CONTRAST CT MAXILLOFACIAL WITHOUT CONTRAST TECHNIQUE: Multidetector CT imaging of the head and maxillofacial structures were performed using the standard protocol without intravenous contrast. Multiplanar CT image reconstructions of the maxillofacial structures were also generated. COMPARISON:  June 27, 2011 FINDINGS: CT HEAD FINDINGS Brain: No evidence of acute infarction, hemorrhage, hydrocephalus, extra-axial collection or mass lesion/mass effect. Mildly advanced global parenchymal volume loss for age. Vascular: Vascular  calcifications of the carotid siphons. Skull: Normal. Negative for fracture or focal lesion. Other: None. CT MAXILLOFACIAL FINDINGS Osseous: No fracture or mandibular dislocation. No destructive process. Orbits: Negative. No traumatic or inflammatory finding. Sinuses: Mild to moderate mucosal thickening of the RIGHT sphenoid sinus. Soft tissues: Laceration of the RIGHT cheek. IMPRESSION: 1.  No acute intracranial abnormality. 2. No fracture or dislocation of the facial bones. 3. Laceration of the RIGHT cheek. Electronically Signed   By: Meda Klinefelter MD   On: 07/24/2020 11:24   DG Knee Right Port  Result Date: 07/24/2020 CLINICAL DATA:  69 year old female with fall and right knee pain and swelling. EXAM: PORTABLE RIGHT KNEE - 1-2 VIEW COMPARISON:  None. FINDINGS: There is no acute fracture or dislocation. Mild osteopenia. There is meniscal chondrocalcinosis. There is a small suprapatellar effusion. The soft tissues are unremarkable. IMPRESSION: 1. No acute fracture or dislocation. 2. Small suprapatellar effusion. 3. Meniscal chondrocalcinosis. Electronically Signed   By: Elgie Collard M.D.   On: 07/24/2020 16:36   ECHOCARDIOGRAM COMPLETE  Result Date: 07/25/2020    ECHOCARDIOGRAM REPORT   Patient Name:   Karen Murphy Date of Exam: 07/25/2020 Medical Rec #:  [id number]   Height:       66.0 in Accession #:    [id number]  Weight:       204.1 lb Date of Birth:  1951-08-01   BSA:          2.017 m Patient Age:    69 years    BP:           154/77 mmHg Patient Gender: F           HR:           67 bpm. Exam Location:  [facility] Procedure: 2D Echo, Color Doppler, Cardiac Doppler and Intracardiac            Opacification Agent Indications:     I48.91 Atrial Fibrillation  History:         Patient has no prior history of Echocardiogram examinations.                  Risk Factors:Hypertension and Diabetes.  Sonographer:     Humphrey Rolls RDCS (AE) Referring Phys:  [id number] Antonieta Iba Diagnosing Phys: Lorine Bears MD   Sonographer Comments: Technically difficult study due to poor echo windows. IMPRESSIONS  1. Left ventricular ejection fraction, by estimation, is 60 to 65%. The left ventricle has normal function. The left ventricle has no regional wall motion abnormalities. Left ventricular diastolic parameters were normal.  2. Right ventricular systolic function is normal. The right ventricular size is normal.  3. Left atrial size was mildly dilated.  4. The mitral valve is normal in structure. No evidence of mitral valve regurgitation. No evidence of  mitral stenosis.  5. The aortic valve is normal in structure. Aortic valve regurgitation is not visualized. Mild to moderate aortic valve sclerosis/calcification is present, without any evidence of aortic stenosis.  6. The inferior vena cava is normal in size with greater than 50% respiratory variability, suggesting right atrial pressure of 3 mmHg. FINDINGS  Left Ventricle: Left ventricular ejection fraction, by estimation, is 60 to 65%. The left ventricle has normal function. The left ventricle has no regional wall motion abnormalities. Definity contrast agent was given IV to delineate the left ventricular  endocardial borders. The left ventricular internal cavity size was normal in size. There is no left ventricular hypertrophy. Left ventricular diastolic parameters were normal. Right Ventricle: The right ventricular size is normal. No increase in right ventricular wall thickness. Right ventricular systolic function is normal. Left Atrium: Left atrial size was mildly dilated. Right Atrium: Right atrial size was normal in size. Pericardium: There is no evidence of pericardial effusion. Mitral Valve: The mitral valve is normal in structure. No evidence of mitral valve regurgitation. No evidence of mitral valve stenosis. MV peak gradient, 3.6 mmHg. The mean mitral valve gradient is 1.0 mmHg. Tricuspid Valve: The tricuspid valve is normal in structure. Tricuspid valve regurgitation is  not demonstrated. No evidence of tricuspid stenosis. Aortic Valve: The aortic valve is normal in structure. Aortic valve regurgitation is not visualized. Mild to moderate aortic valve sclerosis/calcification is present, without any evidence of aortic stenosis. Aortic valve mean gradient measures 4.0 mmHg. Aortic valve peak gradient measures 8.4 mmHg. Aortic valve area, by VTI measures 1.86 cm. Pulmonic Valve: The pulmonic valve was normal in structure. Pulmonic valve regurgitation is not visualized. No evidence of pulmonic stenosis. Aorta: The aortic root is normal in size and structure. Venous: The inferior vena cava is normal in size with greater than 50% respiratory variability, suggesting right atrial pressure of 3 mmHg. IAS/Shunts: No atrial level shunt detected by color flow Doppler.  LEFT VENTRICLE PLAX 2D LVIDd:         3.34 cm  Diastology LVIDs:         2.11 cm  LV e' lateral:   0.08 cm/s LV PW:         0.92 cm  LV E/e' lateral: 13.2 LV IVS:        0.89 cm LVOT diam:     2.00 cm LV SV:         53 LV SV Index:   26 LVOT Area:     3.14 cm  RIGHT VENTRICLE RV Basal diam:  3.61 cm LEFT ATRIUM           Index       RIGHT ATRIUM           Index LA diam:      5.00 cm 2.48 cm/m  RA Area:     14.80 cm LA Vol (A2C): 26.8 ml 13.29 ml/m RA Volume:   37.50 ml  18.59 ml/m LA Vol (A4C): 63.0 ml 31.23 ml/m  AORTIC VALVE                   PULMONIC VALVE AV Area (Vmax):    1.90 cm    PV Vmax:       0.75 m/s AV Area (Vmean):   2.06 cm    PV Vmean:      53.300 cm/s AV Area (VTI):     1.86 cm    PV VTI:        0.167  m AV Vmax:           145.00 cm/s PV Peak grad:  2.2 mmHg AV Vmean:          93.700 cm/s PV Mean grad:  1.0 mmHg AV VTI:            0.283 m AV Peak Grad:      8.4 mmHg AV Mean Grad:      4.0 mmHg LVOT Vmax:         87.90 cm/s LVOT Vmean:        61.300 cm/s LVOT VTI:          0.168 m LVOT/AV VTI ratio: 0.59  AORTA Ao Root diam: 2.80 cm MITRAL VALVE MV Area (PHT): 2.99 cm    SHUNTS MV Peak grad:  3.6 mmHg     Systemic VTI:  0.17 m MV Mean grad:  1.0 mmHg    Systemic Diam: 2.00 cm MV Vmax:       0.95 m/s MV Vmean:      55.7 cm/s MV Decel Time: 254 msec MV E velocity: 1.06 cm/s MV A velocity: 75.20 cm/s MV E/A ratio:  0.01 Lorine Bears MD Electronically signed by Lorine Bears MD Signature Date/Time: 07/25/2020/1:09:56 PM    Final    CT Maxillofacial Wo Contrast  Result Date: 07/24/2020 CLINICAL DATA:  Hit face EXAM: CT HEAD WITHOUT CONTRAST CT MAXILLOFACIAL WITHOUT CONTRAST TECHNIQUE: Multidetector CT imaging of the head and maxillofacial structures were performed using the standard protocol without intravenous contrast. Multiplanar CT image reconstructions of the maxillofacial structures were also generated. COMPARISON:  June 27, 2011 FINDINGS: CT HEAD FINDINGS Brain: No evidence of acute infarction, hemorrhage, hydrocephalus, extra-axial collection or mass lesion/mass effect. Mildly advanced global parenchymal volume loss for age. Vascular: Vascular calcifications of the carotid siphons. Skull: Normal. Negative for fracture or focal lesion. Other: None. CT MAXILLOFACIAL FINDINGS Osseous: No fracture or mandibular dislocation. No destructive process. Orbits: Negative. No traumatic or inflammatory finding. Sinuses: Mild to moderate mucosal thickening of the RIGHT sphenoid sinus. Soft tissues: Laceration of the RIGHT cheek. IMPRESSION: 1.  No acute intracranial abnormality. 2. No fracture or dislocation of the facial bones. 3. Laceration of the RIGHT cheek. Electronically Signed   By: Meda Klinefelter MD   On: 07/24/2020 11:24     CODE STATUS:     Code Status Orders  (From admission, onward)         Start     Ordered   07/24/20 1320  Full code  Continuous        07/24/20 1321        Code Status History    Date Active Date Inactive Code Status Order ID Comments User Context   04/13/2020 1604 04/15/2020 1923 Full Code [id number]  Enedina Finner, MD ED   Advance Care Planning Activity    Advance  Directive Documentation     Most Recent Value  Type of Advance Directive Living will  Pre-existing out of facility DNR order (yellow form or pink MOST form) --  "MOST" Form in Place? --       TOTAL TIME TAKING CARE OF THIS PATIENT: *35* minutes.    Enedina Finner M.D  [facility]    CC: Primary care physician; Garnette Czech, MD

## 2020-07-25 NOTE — Progress Notes (Signed)
*  PRELIMINARY RESULTS* Echocardiogram 2D Echocardiogram has been performed.  Karen Murphy 07/25/2020, 10:15 AM

## 2020-07-25 NOTE — Care Management Obs Status (Signed)
MEDICARE OBSERVATION STATUS NOTIFICATION   Patient Details  Name: Doninique Lwin MRN: [id number] Date of Birth: 02-Sep-1951   Medicare Observation Status Notification Given:  Yes    Reuel Boom Dusty Raczkowski, LCSW 07/25/2020, 4:17 PM

## 2020-07-25 NOTE — Care Management CC44 (Signed)
Condition Code 44 Documentation Completed  Patient Details  Name: Karen Murphy MRN: [id number] Date of Birth: 12/06/1950   Condition Code 44 given:  Yes Patient signature on Condition Code 44 notice:  Yes Documentation of 2 MD's agreement:  Yes Code 44 added to claim:  Yes    Reuel Boom Kamare Caspers, LCSW 07/25/2020, 4:17 PM

## 2020-07-25 NOTE — Progress Notes (Signed)
Progress Note  Patient Name: Karen Murphy Date of Encounter: 07/25/2020  [facility] Cardiologist: new ( Dr. Mariah Milling)  Subjective   She converted to sinus rhythm at 7 AM.  She feels back to her baseline.  No previous A. fib.  Inpatient Medications    Scheduled Meds: . acidophilus  1 capsule Oral Daily  . apixaban  5 mg Oral BID  . atorvastatin  40 mg Oral QPM  . bacitracin   Topical BID  . calcium-vitamin D  1 tablet Oral Daily  . diltiazem  120 mg Oral Daily  . insulin aspart  0-15 Units Subcutaneous TID WC  . metoprolol tartrate  50 mg Oral BID   Continuous Infusions:  PRN Meds: acetaminophen, ondansetron (ZOFRAN) IV   Vital Signs    Vitals:   07/25/20 0113 07/25/20 0509 07/25/20 0758 07/25/20 1143  BP: (!) 178/75 (!) 143/79 (!) 154/77 (!) 151/76  Pulse: 83 75 74 75  Resp:   17 18  Temp: 98.6 F (37 C) 98.6 F (37 C) 98.1 F (36.7 C) 98.3 F (36.8 C)  TempSrc: Oral Oral  Oral  SpO2: 99% 97% 96% 97%  Weight: 92.6 kg     Height: 5\' 6"  (1.676 m)       Intake/Output Summary (Last 24 hours) at 07/25/2020 1538 Last data filed at 07/25/2020 1404 Gross per 24 hour  Intake 11.97 ml  Output 1400 ml  Net -1388.03 ml   Last 3 Weights 07/25/2020 04/13/2020  Weight (lbs) 204 lb 1.6 oz 208 lb 15.9 oz  Weight (kg) 92.579 kg 94.8 kg      Telemetry  Atrial fibrillation converted to sinus rhythm at 7 AM- Personally Reviewed  ECG    Not done today- Personally Reviewed  Physical Exam   GEN: No acute distress.   Neck: No JVD Cardiac: RRR, no murmurs, rubs, or gallops.  Respiratory: Clear to auscultation bilaterally. GI: Soft, nontender, non-distended  MS: No edema; No deformity. Neuro:  Nonfocal  Psych: Normal affect   Labs    High Sensitivity Troponin:   Recent Labs  Lab 07/24/20 1001 07/24/20 1409 07/24/20 1539  TROPONINIHS 14 19* 17      Chemistry Recent Labs  Lab 07/24/20 1001  NA 132*  K 3.8  CL 96*  CO2 22  GLUCOSE 265*  BUN 20    CREATININE 1.11*  CALCIUM 9.5  GFRNONAA 50*  ANIONGAP 14     Hematology Recent Labs  Lab 07/24/20 1001  WBC 8.0  RBC 3.80*  HGB 11.5*  HCT 33.6*  MCV 88.4  MCH 30.3  MCHC 34.2  RDW 12.3  PLT 350    BNP Recent Labs  Lab 07/24/20 1001  BNP 63.6     DDimer No results for input(s): DDIMER in the last 168 hours.   Radiology    DG Chest 2 View  Result Date: 07/24/2020 CLINICAL DATA:  Syncope EXAM: CHEST - 2 VIEW COMPARISON:  April 13, 2020 FINDINGS: The cardiomediastinal silhouette is unchanged in contour.Resolution of previously described RIGHT basilar heterogeneous opacities. No pleural effusion. No pneumothorax. No acute pleuroparenchymal abnormality. Visualized abdomen is unremarkable. Multilevel degenerative changes of the thoracic spine. IMPRESSION: Resolution of previously described RIGHT basilar heterogeneous opacities. No new focal consolidation. Electronically Signed   By: April 15, 2020 MD   On: 07/24/2020 11:17   CT Head Wo Contrast  Result Date: 07/24/2020 CLINICAL DATA:  Hit face EXAM: CT HEAD WITHOUT CONTRAST CT MAXILLOFACIAL WITHOUT CONTRAST TECHNIQUE: Multidetector CT imaging of  the head and maxillofacial structures were performed using the standard protocol without intravenous contrast. Multiplanar CT image reconstructions of the maxillofacial structures were also generated. COMPARISON:  June 27, 2011 FINDINGS: CT HEAD FINDINGS Brain: No evidence of acute infarction, hemorrhage, hydrocephalus, extra-axial collection or mass lesion/mass effect. Mildly advanced global parenchymal volume loss for age. Vascular: Vascular calcifications of the carotid siphons. Skull: Normal. Negative for fracture or focal lesion. Other: None. CT MAXILLOFACIAL FINDINGS Osseous: No fracture or mandibular dislocation. No destructive process. Orbits: Negative. No traumatic or inflammatory finding. Sinuses: Mild to moderate mucosal thickening of the RIGHT sphenoid sinus. Soft  tissues: Laceration of the RIGHT cheek. IMPRESSION: 1.  No acute intracranial abnormality. 2. No fracture or dislocation of the facial bones. 3. Laceration of the RIGHT cheek. Electronically Signed   By: Meda Klinefelter MD   On: 07/24/2020 11:24   DG Knee Right Port  Result Date: 07/24/2020 CLINICAL DATA:  69 year old female with fall and right knee pain and swelling. EXAM: PORTABLE RIGHT KNEE - 1-2 VIEW COMPARISON:  None. FINDINGS: There is no acute fracture or dislocation. Mild osteopenia. There is meniscal chondrocalcinosis. There is a small suprapatellar effusion. The soft tissues are unremarkable. IMPRESSION: 1. No acute fracture or dislocation. 2. Small suprapatellar effusion. 3. Meniscal chondrocalcinosis. Electronically Signed   By: Elgie Collard M.D.   On: 07/24/2020 16:36   ECHOCARDIOGRAM COMPLETE  Result Date: 07/25/2020    ECHOCARDIOGRAM REPORT   Patient Name:   Karen Murphy Date of Exam: 07/25/2020 Medical Rec #:  [id number]   Height:       66.0 in Accession #:    [id number]  Weight:       204.1 lb Date of Birth:  [birth year]   BSA:          2.017 m Patient Age:    69 years    BP:           154/77 mmHg Patient Gender: F           HR:           67 bpm. Exam Location:  [facility] Procedure: 2D Echo, Color Doppler, Cardiac Doppler and Intracardiac            Opacification Agent Indications:     I48.91 Atrial Fibrillation  History:         Patient has no prior history of Echocardiogram examinations.                  Risk Factors:Hypertension and Diabetes.  Sonographer:     Humphrey Rolls RDCS (AE) Referring Phys:  [id number] Antonieta Iba Diagnosing Phys: Lorine Bears MD  Sonographer Comments: Technically difficult study due to poor echo windows. IMPRESSIONS  1. Left ventricular ejection fraction, by estimation, is 60 to 65%. The left ventricle has normal function. The left ventricle has no regional wall motion abnormalities. Left ventricular diastolic parameters were normal.  2. Right ventricular  systolic function is normal. The right ventricular size is normal.  3. Left atrial size was mildly dilated.  4. The mitral valve is normal in structure. No evidence of mitral valve regurgitation. No evidence of mitral stenosis.  5. The aortic valve is normal in structure. Aortic valve regurgitation is not visualized. Mild to moderate aortic valve sclerosis/calcification is present, without any evidence of aortic stenosis.  6. The inferior vena cava is normal in size with greater than 50% respiratory variability, suggesting right atrial pressure of 3 mmHg. FINDINGS  Left Ventricle: Left ventricular ejection  fraction, by estimation, is 60 to 65%. The left ventricle has normal function. The left ventricle has no regional wall motion abnormalities. Definity contrast agent was given IV to delineate the left ventricular  endocardial borders. The left ventricular internal cavity size was normal in size. There is no left ventricular hypertrophy. Left ventricular diastolic parameters were normal. Right Ventricle: The right ventricular size is normal. No increase in right ventricular wall thickness. Right ventricular systolic function is normal. Left Atrium: Left atrial size was mildly dilated. Right Atrium: Right atrial size was normal in size. Pericardium: There is no evidence of pericardial effusion. Mitral Valve: The mitral valve is normal in structure. No evidence of mitral valve regurgitation. No evidence of mitral valve stenosis. MV peak gradient, 3.6 mmHg. The mean mitral valve gradient is 1.0 mmHg. Tricuspid Valve: The tricuspid valve is normal in structure. Tricuspid valve regurgitation is not demonstrated. No evidence of tricuspid stenosis. Aortic Valve: The aortic valve is normal in structure. Aortic valve regurgitation is not visualized. Mild to moderate aortic valve sclerosis/calcification is present, without any evidence of aortic stenosis. Aortic valve mean gradient measures 4.0 mmHg. Aortic valve peak  gradient measures 8.4 mmHg. Aortic valve area, by VTI measures 1.86 cm. Pulmonic Valve: The pulmonic valve was normal in structure. Pulmonic valve regurgitation is not visualized. No evidence of pulmonic stenosis. Aorta: The aortic root is normal in size and structure. Venous: The inferior vena cava is normal in size with greater than 50% respiratory variability, suggesting right atrial pressure of 3 mmHg. IAS/Shunts: No atrial level shunt detected by color flow Doppler.  LEFT VENTRICLE PLAX 2D LVIDd:         3.34 cm  Diastology LVIDs:         2.11 cm  LV e' lateral:   0.08 cm/s LV PW:         0.92 cm  LV E/e' lateral: 13.2 LV IVS:        0.89 cm LVOT diam:     2.00 cm LV SV:         53 LV SV Index:   26 LVOT Area:     3.14 cm  RIGHT VENTRICLE RV Basal diam:  3.61 cm LEFT ATRIUM           Index       RIGHT ATRIUM           Index LA diam:      5.00 cm 2.48 cm/m  RA Area:     14.80 cm LA Vol (A2C): 26.8 ml 13.29 ml/m RA Volume:   37.50 ml  18.59 ml/m LA Vol (A4C): 63.0 ml 31.23 ml/m  AORTIC VALVE                   PULMONIC VALVE AV Area (Vmax):    1.90 cm    PV Vmax:       0.75 m/s AV Area (Vmean):   2.06 cm    PV Vmean:      53.300 cm/s AV Area (VTI):     1.86 cm    PV VTI:        0.167 m AV Vmax:           145.00 cm/s PV Peak grad:  2.2 mmHg AV Vmean:          93.700 cm/s PV Mean grad:  1.0 mmHg AV VTI:            0.283 m AV Peak Grad:  8.4 mmHg AV Mean Grad:      4.0 mmHg LVOT Vmax:         87.90 cm/s LVOT Vmean:        61.300 cm/s LVOT VTI:          0.168 m LVOT/AV VTI ratio: 0.59  AORTA Ao Root diam: 2.80 cm MITRAL VALVE MV Area (PHT): 2.99 cm    SHUNTS MV Peak grad:  3.6 mmHg    Systemic VTI:  0.17 m MV Mean grad:  1.0 mmHg    Systemic Diam: 2.00 cm MV Vmax:       0.95 m/s MV Vmean:      55.7 cm/s MV Decel Time: 254 msec MV E velocity: 1.06 cm/s MV A velocity: 75.20 cm/s MV E/A ratio:  0.01 Lorine Bears MD Electronically signed by Lorine Bears MD Signature Date/Time: 07/25/2020/1:09:56 PM     Final    CT Maxillofacial Wo Contrast  Result Date: 07/24/2020 CLINICAL DATA:  Hit face EXAM: CT HEAD WITHOUT CONTRAST CT MAXILLOFACIAL WITHOUT CONTRAST TECHNIQUE: Multidetector CT imaging of the head and maxillofacial structures were performed using the standard protocol without intravenous contrast. Multiplanar CT image reconstructions of the maxillofacial structures were also generated. COMPARISON:  June 27, 2011 FINDINGS: CT HEAD FINDINGS Brain: No evidence of acute infarction, hemorrhage, hydrocephalus, extra-axial collection or mass lesion/mass effect. Mildly advanced global parenchymal volume loss for age. Vascular: Vascular calcifications of the carotid siphons. Skull: Normal. Negative for fracture or focal lesion. Other: None. CT MAXILLOFACIAL FINDINGS Osseous: No fracture or mandibular dislocation. No destructive process. Orbits: Negative. No traumatic or inflammatory finding. Sinuses: Mild to moderate mucosal thickening of the RIGHT sphenoid sinus. Soft tissues: Laceration of the RIGHT cheek. IMPRESSION: 1.  No acute intracranial abnormality. 2. No fracture or dislocation of the facial bones. 3. Laceration of the RIGHT cheek. Electronically Signed   By: Meda Klinefelter MD   On: 07/24/2020 11:24    Cardiac Studies   Echocardiogram was done today and was personally reviewed by me:  1. Left ventricular ejection fraction, by estimation, is 60 to 65%. The  left ventricle has normal function. The left ventricle has no regional  wall motion abnormalities. Left ventricular diastolic parameters were  normal.  2. Right ventricular systolic function is normal. The right ventricular  size is normal.  3. Left atrial size was mildly dilated.  4. The mitral valve is normal in structure. No evidence of mitral valve  regurgitation. No evidence of mitral stenosis.  5. The aortic valve is normal in structure. Aortic valve regurgitation is  not visualized. Mild to moderate aortic valve  sclerosis/calcification is  present, without any evidence of aortic stenosis.  6. The inferior vena cava is normal in size with greater than 50%  respiratory variability, suggesting right atrial pressure of 3 mmHg.   Patient Profile     69 y.o. female with past medical history of essential hypertension, previous tobacco use, diabetes mellitus and previous pneumonia who presented with syncope in the setting of A. fib with RVR  Assessment & Plan    1.  Paroxysmal atrial fibrillation: The patient presented with atrial fibrillation with RVR but converted to sinus rhythm on diltiazem drip at 7 AM.  Continue metoprolol 50 mg twice daily.  She was on amlodipine as an outpatient and I elected to switch that to diltiazem extended release 120 mg once daily. Chads vas score is 4 and thus I agree with long-term anticoagulation.  Continue Eliquis 5 mg twice daily  which is a new medication for the patient.  2.  Syncope: This happened in the setting of standing up when she was having palpitations and likely was in A. fib with RVR.  I suspect that the episode was due to orthostatic hypotension in the setting of significant tachycardia.  However, other underlying arrhythmia cannot be completely excluded.  Normal ejection fraction is reassuring.  I do recommend a 2-week ZIO monitor upon discharge and will arrange for this today.  3.  Essential hypertension: Continue to hold other antihypertensive medication and keep the patient on metoprolol and diltiazem for now.   The patient otherwise can be discharged from a cardiac standpoint and will arrange for monitor and follow-up in our office.  For questions or updates, please contact [facility] Please consult [email or web address] for contact info under        Signed, Lorine Bears, MD  07/25/2020, 3:38 PM

## 2020-07-29 ENCOUNTER — Encounter: Payer: Self-pay | Admitting: Family

## 2020-07-29 ENCOUNTER — Ambulatory Visit (INDEPENDENT_AMBULATORY_CARE_PROVIDER_SITE_OTHER): Payer: Medicare Other | Admitting: Family

## 2020-07-29 ENCOUNTER — Other Ambulatory Visit: Payer: Self-pay

## 2020-07-29 VITALS — BP 130/70 | HR 68 | Ht 66.0 in | Wt 209.0 lb

## 2020-07-29 DIAGNOSIS — I480 Paroxysmal atrial fibrillation: Secondary | ICD-10-CM | POA: Diagnosis not present

## 2020-07-29 DIAGNOSIS — E782 Mixed hyperlipidemia: Secondary | ICD-10-CM | POA: Diagnosis not present

## 2020-07-29 DIAGNOSIS — I10 Essential (primary) hypertension: Secondary | ICD-10-CM

## 2020-07-29 MED ORDER — APIXABAN 5 MG PO TABS: 5.0000 mg | ORAL_TABLET | Freq: Two times a day (BID) | ORAL | 11 refills | Status: DC

## 2020-07-29 NOTE — Patient Instructions (Signed)
Medication Instructions:  NO changes today.  We sent a refill of your Eliquis to the pharmacy.  If this medication becomes too expensive, please let us know and we can fill out patient assistance paperwork.  *If you need a refill on your cardiac medications before your next appointment, please call your pharmacy*   Lab Work: None ordered today.   Testing/Procedures: Please remove your ZIO monitor after 14 days of wearing.  Your echocardiogram (ultrasound of your heart) during the hospitalization looked great!  Follow-Up: At [facility], you and your health needs are our priority.  As part of our continuing mission to provide you with exceptional heart care, we have created designated Provider Care Teams.  These Care Teams include your primary Cardiologist (physician) and Advanced Practice Providers (APPs -  Physician Assistants and Nurse Practitioners) who all work together to provide you with the care you need, when you need it.  We recommend signing up for the patient portal called "MyChart".  Sign up information is provided on this After Visit Summary.  MyChart is used to connect with patients for Virtual Visits (Telemedicine).  Patients are able to view lab/test results, encounter notes, upcoming appointments, etc.  Non-urgent messages can be sent to your provider as well.   To learn more about what you can do with MyChart, go to [email or web address].    Your next appointment:   6 week(s)  The format for your next appointment:   In Person  Provider:   You may see Julien Nordmann, MD or one of the following Advanced Practice Providers on your designated Care Team:   Nicolasa Ducking, NP  Eula Listen, PA-C  Marisue Ivan, PA-C  Cadence Fransico Michael, [place]  Gillian Shields, NP  Other Instructions  Atrial Fibrillation  Atrial fibrillation is a type of heartbeat that is irregular or fast. If you have this condition, your heart beats without any order. This makes it hard for  your heart to pump blood in a normal way. Atrial fibrillation may come and go, or it may become a long-lasting problem. If this condition is not treated, it can put you at higher risk for stroke, heart failure, and other heart problems. What are the causes? This condition may be caused by diseases that damage the heart. They include:  High blood pressure.  Heart failure.  Heart valve disease.  Heart surgery. Other causes include:  Diabetes.  Thyroid disease.  Being overweight.  Kidney disease. Sometimes the cause is not known. What increases the risk? You are more likely to develop this condition if:  You are older.  You smoke.  You exercise often and very hard.  You have a family history of this condition.  You are a man.  You use drugs.  You drink a lot of alcohol.  You have lung conditions, such as emphysema, pneumonia, or COPD.  You have sleep apnea. What are the signs or symptoms? Common symptoms of this condition include:  A feeling that your heart is beating very fast.  Chest pain or discomfort.  Feeling short of breath.  Suddenly feeling light-headed or weak.  Getting tired easily during activity.  Fainting.  Sweating. In some cases, there are no symptoms. How is this treated? Treatment for this condition depends on underlying conditions and how you feel when you have atrial fibrillation. They include:  Medicines to: ? Prevent blood clots. ? Treat heart rate or heart rhythm problems.  Using devices, such as a pacemaker, to correct heart rhythm problems.  Doing  surgery to remove the part of the heart that sends bad signals.  Closing an area where clots can form in the heart (left atrial appendage). In some cases, your doctor will treat other underlying conditions. Follow these instructions at home: Medicines  Take over-the-counter and prescription medicines only as told by your doctor.  Do not take any new medicines without first  talking to your doctor.  If you are taking blood thinners: ? Talk with your doctor before you take any medicines that have aspirin or NSAIDs, such as ibuprofen, in them. ? Take your medicine exactly as told by your doctor. Take it at the same time each day. ? Avoid activities that could hurt or bruise you. Follow instructions about how to prevent falls. ? Wear a bracelet that says you are taking blood thinners. Or, carry a card that lists what medicines you take. Lifestyle      Do not use any products that have nicotine or tobacco in them. These include cigarettes, e-cigarettes, and chewing tobacco. If you need help quitting, ask your doctor.  Eat heart-healthy foods. Talk with your doctor about the right eating plan for you.  Exercise regularly as told by your doctor.  Do not drink alcohol.  Lose weight if you are overweight.  Do not use drugs, including cannabis. General instructions  If you have a condition that causes breathing to stop for a short period of time (apnea), treat it as told by your doctor.  Keep a healthy weight. Do not use diet pills unless your doctor says they are safe for you. Diet pills may make heart problems worse.  Keep all follow-up visits as told by your doctor. This is important. Contact a doctor if:  You notice a change in the speed, rhythm, or strength of your heartbeat.  You are taking a blood-thinning medicine and you get more bruising.  You get tired more easily when you move or exercise.  You have a sudden change in weight. Get help right away if:   You have pain in your chest or your belly (abdomen).  You have trouble breathing.  You have side effects of blood thinners, such as blood in your vomit, poop (stool), or pee (urine), or bleeding that cannot stop.  You have any signs of a stroke. "BE FAST" is an easy way to remember the main warning signs: ? B - Balance. Signs are dizziness, sudden trouble walking, or loss of balance. ? E  - Eyes. Signs are trouble seeing or a change in how you see. ? F - Face. Signs are sudden weakness or loss of feeling in the face, or the face or eyelid drooping on one side. ? A - Arms. Signs are weakness or loss of feeling in an arm. This happens suddenly and usually on one side of the body. ? S - Speech. Signs are sudden trouble speaking, slurred speech, or trouble understanding what people say. ? T - Time. Time to call emergency services. Write down what time symptoms started.  You have other signs of a stroke, such as: ? A sudden, very bad headache with no known cause. ? Feeling like you may vomit (nausea). ? Vomiting. ? A seizure. These symptoms may be an emergency. Do not wait to see if the symptoms will go away. Get medical help right away. Call your local emergency services (911 in the [place] Do not drive yourself to the hospital. Summary  Atrial fibrillation is a type of heartbeat that is irregular or  fast.  You are at higher risk of this condition if you smoke, are older, have diabetes, or are overweight.  Follow your doctor's instructions about medicines, diet, exercise, and follow-up visits.  Get help right away if you have signs or symptoms of a stroke.  Get help right away if you cannot catch your breath, or you have chest pain or discomfort. This information is not intended to replace advice given to you by your health care provider. Make sure you discuss any questions you have with your health care provider. Document Revised: 03/25/2019 Document Reviewed: 03/25/2019 Elsevier Patient Education  2020 [employer].

## 2020-07-29 NOTE — Progress Notes (Signed)
Office Visit    Patient Name: Karen Murphy Date of Encounter: 07/29/2020  Primary Care Provider:  Garnette Czech, MD Primary Cardiologist:  Julien Nordmann, MD Electrophysiologist:  None   Chief Complaint    Karen Murphy is a 69 y.o. female with a hx of HTN, PAF on Eliquis, syncope, HLD, DM 2, previous tobacco use, previous pneumonia, rheumatoid arthritis presents today for hospital follow up.    Past Medical History    Past Medical History:  Diagnosis Date  . Anemia   . Arthritis   . Diabetes mellitus without complication (HCC)   . Hypertension    History reviewed. No pertinent surgical history.  Allergies  No Known Allergies  History of Present Illness    Karen Murphy is a 69 y.o. female with a hx of HTN, PAF on Eliquis, syncope, HLD, DM 2, previous tobacco use, previous pneumonia, rheumatoid arthritis last seen while hospitalized.  She presented to the ED in paroxysmal atrial fibrillation with RVR and converted to NSR on diltiazem drip.  She has a CHA2DS2-VASc of score and as such she was started on Eliquis 5 mg twice daily.  She did also have syncope which is in the setting of standing up during palpitations and likely due to her atrial for with RVR and orthostatic hypotension.  She was recommended for 2-week ZIO on discharge. 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  Her diltiazem was stopped and she was started on verapamil.  She reported her rash improved yesterday to her primary care provider.  Reports feeling well. Tells me her rash has nearly resolved. No recurrent palpitations nor shortness of breath. Reports no chest pain, pressure, or tightness. No edema, orthopnea, PND.  He had a long discussion regarding atrial  fibrillation and the pathophysiology as well as treatment.   EKGs/Labs/Other Studies Reviewed:   The following studies were reviewed today:  Echo 07/24/20 1. Left ventricular ejection fraction, by estimation, is 60 to 65%. The  left ventricle has normal function. The left ventricle has no regional  wall motion abnormalities. Left ventricular diastolic parameters were  normal.   2. Right ventricular systolic function is normal. The right ventricular  size is normal.   3. Left atrial size was mildly dilated.   4. The mitral valve is normal in structure. No evidence of mitral valve  regurgitation. No evidence of mitral stenosis.   5. The aortic valve is normal in structure. Aortic valve regurgitation is  not visualized. Mild to moderate aortic valve sclerosis/calcification is  present, without any evidence of aortic stenosis.   6. The inferior vena cava is normal in size with greater than 50%  respiratory variability, suggesting right atrial pressure of 3 mmHg.     EKG:  EKG is  ordered today.  The ekg ordered today demonstrates NSR 68 bpm with no acute ST/T wave changes.   Recent Labs: 07/24/2020: B Natriuretic Peptide 63.6; BUN 20; Creatinine, Ser 1.11; Hemoglobin 11.5; Magnesium 1.7; Platelets 350; Potassium 3.8; Sodium 132; TSH 0.746  Recent Lipid Panel No results found for: CHOL, TRIG, HDL, CHOLHDL, VLDL, LDLCALC, LDLDIRECT  Risk Assessment/Calculations:  0746}  CHA2DS2-VASc Score = 4  This indicates a 4.8% annual risk of stroke. The patient's score is based upon: CHF History: 0 HTN History: 1  Diabetes History: 1 Stroke History: 0 Vascular Disease History: 0 Age Score: 1 Gender Score: 1  Home Medications   Current Meds  Medication Sig  . acetaminophen (TYLENOL) 325 MG tablet Take 650 mg by mouth every 6 (six) hours as needed for mild pain or moderate pain.  Marland Kitchen apixaban (ELIQUIS) 5 MG TABS tablet Take 1 tablet (5 mg total) by mouth 2 (two) times daily.  Marland Kitchen ascorbic  acid (VITAMIN C) 500 MG tablet Take 500 mg by mouth daily.  Marland Kitchen atorvastatin (LIPITOR) 40 MG tablet Take 40 mg by mouth every evening.  . Calcium Carbonate-Vitamin D3 (CALCIUM 600/VITAMIN D) 600-400 MG-UNIT TABS Take 1 tablet by mouth daily.  Marland Kitchen glipiZIDE (GLUCOTROL XL) 10 MG 24 hr tablet Take 20 mg by mouth daily.  Marland Kitchen Lactobacillus-Inulin (PROBIOTIC DIGESTIVE SUPPORT) CAPS Take 1 capsule by mouth daily.  Marland Kitchen losartan (COZAAR) 100 MG tablet Take 0.5 tablets (50 mg total) by mouth daily.  . metFORMIN (GLUCOPHAGE-XR) 500 MG 24 hr tablet Take 1,500 mg by mouth daily with supper.  . metoprolol tartrate (LOPRESSOR) 50 MG tablet Take 50 mg by mouth 2 (two) times daily.  . naproxen sodium (ALEVE) 220 MG tablet Take 220 mg by mouth every 12 (twelve) hours as needed (pain).  Marland Kitchen OZEMPIC, 0.25 OR 0.5 MG/DOSE, 2 MG/1.5ML SOPN Inject 0.5 mg into the skin every Monday.  Nathen May SOLOSTAR 300 UNIT/ML Solostar Pen Inject 18 Units into the skin at bedtime.  . verapamil (CALAN-SR) 120 MG CR tablet Take by mouth.     Review of Systems  All other systems reviewed and are otherwise negative except as noted above.  Physical Exam    VS:  BP 130/70 (BP Location: Left Arm, Patient Position: Sitting, Cuff Size: Normal)   Pulse 68   Ht 5\' 6"  (1.676 m)   Wt 209 lb (94.8 kg)   SpO2 96%   BMI 33.73 kg/m  , BMI Body mass index is 33.73 kg/m.  Wt Readings from Last 3 Encounters:  07/29/20 209 lb (94.8 kg)  07/25/20 204 lb 1.6 oz (92.6 kg)  04/13/20 208 lb 15.9 oz (94.8 kg)    GEN: Well nourished, overweight, well developed, in no acute distress. HEENT: normal. Neck: Supple, no JVD, carotid bruits, or masses. Cardiac: RRR, no murmurs, rubs, or gallops. No clubbing, cyanosis, edema.  Radials/DP/PT 2+ and equal bilaterally.  Respiratory:  Respirations regular and unlabored, clear to auscultation bilaterally. GI: Soft, nontender, nondistended. MS: No deformity or atrophy. Skin: Warm and dry, no rash. Neuro:  Strength  and sensation are intact. Psych: Normal affect.  Assessment & Plan    1. PAF / Chronic anticoagulation- New diagnosis during hospitalization 07/24/20. Wearing ZIO monitor. 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 No recurrent palpitations nor dyspnea. She had rash on Diltaizem and was transitioned to Verapamil by her PCP, continue Verapamil 120mg  daily. Continue Eliquis 5mg  BID, denies bleeding complications. Refill of Eliquis provided. Will plan to repeat CBC at follow up.   CHA2DS2-VASc Score = 4 [CHF History: 0, HTN History: 1, Diabetes History: 1, Stroke History: 0, Vascular Disease History: 0, Age Score: 1, Gender Score: 1].  Therefore, the patient's annual risk of stroke is 4.8 %.     2. Syncope- In setting of atrial fibrillation. Suspect orthostasis in setting of tachycardia. No recurrence. Echo 07/25/20 LVEF 60-65%, no RWIMA, LA mildly dilated, mild to moderate AV sclerosis without stenosis. Presently wearing ZIO monitor.   3. HTN- BP  well controlled. Continue current antihypertensive regimen.   4. HLD- Continue Atorvastatin per PCP.   Disposition: Follow up in 6 week(s) with Dr. Mariah Milling or APP  Signed, Alver Sorrow, NP 07/29/2020, 10:38 AM [facility]

## 2020-08-15 ENCOUNTER — Telehealth: Payer: Self-pay | Admitting: Cardiovascular Disease

## 2020-08-15 NOTE — Telephone Encounter (Signed)
I spoke with LaDonna at [facility].  Patient has returned monitor but information can not be downloaded as patient has no information registered.  Registration is usually done on line through Zio.  Patient will need to be registered to serial number below.  Once patient has been registered LaDonna requests we call zio and let them know.

## 2020-08-15 NOTE — Telephone Encounter (Signed)
Please call to discuss monitor needs to be registered. Reference # [id number] Serial # [id number]

## 2020-08-15 NOTE — Telephone Encounter (Signed)
I have registered the patient's monitor in [employer]. I have called [employer] and notified staff the monitor has been registered.  This has been confirmed by [employer].

## 2020-08-25 ENCOUNTER — Other Ambulatory Visit: Payer: Self-pay

## 2020-08-25 DIAGNOSIS — I480 Paroxysmal atrial fibrillation: Secondary | ICD-10-CM

## 2020-09-06 ENCOUNTER — Telehealth: Payer: Self-pay | Admitting: *Deleted

## 2020-09-06 NOTE — Telephone Encounter (Signed)
Spoke to pt, notified of results and recc to increase Verapamil. Pt states she is concerned with increasing Verapamil at this time due to possible side effects she is experiencing.  Pt reports increased fatigue and "unable to walk as far since starting Verapamil." "One day I left the store and went home because I was too tired to continue shopping."  Pt denies shortness of breath or any other symptoms w/ fatigue/tiredness. She is monitoring her BP/HR at home - SBP running in 130s and HR in 90s. Pt states she will do what her provider recommends, but wanted to notify us of these symptoms first. Please advise.

## 2020-09-06 NOTE — Telephone Encounter (Signed)
-----   Message from Alver Sorrow, NP sent at 09/05/2020  4:58 PM EST ----- Monitor showed predominantly normal sinus rhythm. She had intermittent atrial fibrillation with burden of 1%. Heart rate elevated during atrial fibrillation which is common.   Recommend increasing Verapamil to 180mg  QD to prevent atrial fibrillation.   Please remind her of her upcoming appt 09/14/20 with Dr. 14/1/21.

## 2020-09-06 NOTE — Telephone Encounter (Signed)
If she notices new fatigue, let's defer medication changes until she sees Dr. Mariah Milling in clinic next week 09/14/20. (I've CC'd him as an Financial planner).   Please ask her to bring her log of BP/HR readings to her upcoming appointment. Also remind her to eat and drink regularly to prevent any dehydration or low blood sugar contributory to fatigue.   Best, Alver Sorrow, NP

## 2020-09-06 NOTE — Telephone Encounter (Signed)
Spoke to pt, notified of recc below. Pt verbalized understanding and will bring BP/HR log to appt next week.

## 2020-09-13 NOTE — Progress Notes (Signed)
Cardiology Office Note  Date:  09/14/2020   ID:  Karen Murphy, DOB 1951/03/07, MRN [id number]  PCP:  Garnette Czech, MD   Chief Complaint  Patient presents with  . Follow-up    6 Weeks follow up and review Zio Monitor results. Per patient she has no other issues or concerns she wishes to discuss at today's visit. Medications verbally reviewed with patient.     HPI:  Karen Murphy is a 69 y.o. female with a hx of  HTN,  PAF on Eliquis, syncope/orthostasis in the setting of arrhythmia,  HLD, DM 2, previous tobacco use, previous pneumonia, rheumatoid arthritis  Presenting for follow-up of her atrial fibrillation  In the hospital October 2021 Records reviewed  paroxysmal atrial fibrillation with RVR and converted to NSR on diltiazem drip.  Eliquis 5 mg twice daily.   syncope  in the setting of standing up during palpitations and likely due to her atrial for with RVR and orthostatic hypotension.     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  Her diltiazem was stopped and she was started on verapamil.  Recent monitor showing paroxysmal atrial fibrillation 1% burden Longest episode 2 hours 38 minutes heart rate 121  Maybe tired on verapamil, better now, Nervous about increasing dose at this time, still running 120 daily  Sedentary baseline, legs weak, presenting in a wheelchair Blood pressure heart rates reviewed from home, frequent blood pressure measurements 140 sometimes up to 150 systolic, rare 130 Previously on losartan 100, currently taking 50   PMH:   has a past medical history of Anemia, Arthritis, Diabetes mellitus without complication (HCC), and Hypertension.  PSH:   History reviewed. No pertinent surgical history.  Current Outpatient  Medications  Medication Sig Dispense Refill  . acetaminophen (TYLENOL) 325 MG tablet Take 650 mg by mouth every 6 (six) hours as needed for mild pain or moderate pain.    Marland Kitchen ascorbic acid (VITAMIN C) 500 MG tablet Take 500 mg by mouth daily.    . Calcium Carbonate-Vitamin D3 (CALCIUM 600/VITAMIN D) 600-400 MG-UNIT TABS Take 1 tablet by mouth daily.    . cephALEXin (KEFLEX) 500 MG capsule Take 500 mg by mouth 2 (two) times daily.    Marland Kitchen glipiZIDE (GLUCOTROL XL) 10 MG 24 hr tablet Take 20 mg by mouth daily.    Marland Kitchen Lactobacillus-Inulin (PROBIOTIC DIGESTIVE SUPPORT) CAPS Take 1 capsule by mouth daily.    . metFORMIN (GLUCOPHAGE-XR) 500 MG 24 hr tablet Take 1,500 mg by mouth daily with supper.    . metoprolol tartrate (LOPRESSOR) 50 MG tablet Take 1.5 tablets (75 mg total) by mouth 2 (two) times daily. 270 tablet 3  . naproxen sodium (ALEVE) 220 MG tablet Take 220 mg by mouth every 12 (twelve) hours as needed (pain).    . ondansetron (ZOFRAN) 8 MG tablet TAKE 1 TABLET BY MOUTH  EVERY 8 HOURS AS NEEDED FOR NAUSEA AND VOMITING    . OZEMPIC, 0.25 OR 0.5 MG/DOSE, 2 MG/1.5ML SOPN Inject 0.5 mg into the skin every Monday.    Nathen May SOLOSTAR 300 UNIT/ML Solostar Pen Inject 18 Units into the skin at bedtime.    Marland Kitchen apixaban (ELIQUIS) 5 MG TABS tablet Take 1 tablet (5 mg total) by mouth 2 (two) times daily. 180 tablet 3  . atorvastatin (  LIPITOR) 40 MG tablet Take 1 tablet (40 mg total) by mouth every evening. 90 tablet 3  . losartan (COZAAR) 100 MG tablet Take 0.5 tablets (50 mg total) by mouth daily. 45 tablet 3  . verapamil (CALAN-SR) 120 MG CR tablet Take by mouth.     No current facility-administered medications for this visit.     Allergies:   Diltiazem   Social History:  The patient  reports that she has quit smoking. She has never used smokeless tobacco.   Family History:   family history includes Hypertension in her father and mother.    Review of Systems: Review of Systems  Constitutional:  Negative.   HENT: Negative.   Respiratory: Negative.   Cardiovascular: Negative.   Gastrointestinal: Negative.   Musculoskeletal: Negative.   Neurological: Negative.   Psychiatric/Behavioral: Negative.   All other systems reviewed and are negative.   PHYSICAL EXAM: VS:  BP 140/80 (BP Location: Right Arm, Patient Position: Sitting, Cuff Size: Normal)   Pulse 74   Ht 5\' 6"  (1.676 m)   Wt 205 lb (93 kg)   SpO2 97%   BMI 33.09 kg/m  , BMI Body mass index is 33.09 kg/m. GEN: Well nourished, well developed, in no acute distress HEENT: normal Neck: no JVD, carotid bruits, or masses Cardiac: RRR; no murmurs, rubs, or gallops,no edema  Respiratory:  clear to auscultation bilaterally, normal work of breathing GI: soft, nontender, nondistended, + BS MS: no deformity or atrophy Skin: warm and dry, no rash Neuro:  Strength and sensation are intact Psych: euthymic mood, full affect  Recent Labs: 07/24/2020: B Natriuretic Peptide 63.6; BUN 20; Creatinine, Ser 1.11; Hemoglobin 11.5; Magnesium 1.7; Platelets 350; Potassium 3.8; Sodium 132; TSH 0.746    Lipid Panel No results found for: CHOL, HDL, LDLCALC, TRIG    Wt Readings from Last 3 Encounters:  09/14/20 205 lb (93 kg)  07/29/20 209 lb (94.8 kg)  07/25/20 204 lb 1.6 oz (92.6 kg)      ASSESSMENT AND PLAN:  Problem List Items Addressed This Visit      Cardiology Problems   Essential hypertension   Relevant Medications   metoprolol tartrate (LOPRESSOR) 50 MG tablet   Atrial fibrillation with rapid ventricular response (HCC) - Primary   Relevant Medications   metoprolol tartrate (LOPRESSOR) 50 MG tablet     Other   Type 2 diabetes mellitus without complication, with long-term current use of insulin (HCC)    Other Visit Diagnoses    PAF (paroxysmal atrial fibrillation) (HCC)       Relevant Medications   metoprolol tartrate (LOPRESSOR) 50 MG tablet   Paroxysmal atrial fibrillation (HCC)       Relevant Medications    metoprolol tartrate (LOPRESSOR) 50 MG tablet     Atrial fibrillation with RVR Maintaining normal sinus rhythm, PAF on monitor, 1%, relatively asymptomatic We will continue Eliquis, verapamil,  at current doses We will increase metoprolol up to 75 mg twice a day She has had hypotension with associated A. fib with RVR  Diabetes type 2 Hemoglobin A1c 7.5 We have encouraged continued exercise, careful diet management in an effort to lose weight. Recommended walking program  Essential hypertension Continue medications as detailed above Increase metoprolol dosing for hypertension and paroxysmal atrial fibrillation  Debility Presents today in a wheelchair, recommended walking program   Total encounter time more than 25 minutes  Greater than 50% was spent in counseling and coordination of care with the patient    Signed, 09/24/20  Rockey Situ, M.D., Ph.D. [place], [place]

## 2020-09-14 ENCOUNTER — Ambulatory Visit (INDEPENDENT_AMBULATORY_CARE_PROVIDER_SITE_OTHER): Payer: Medicare Other | Admitting: Cardiovascular Disease

## 2020-09-14 ENCOUNTER — Encounter: Payer: Self-pay | Admitting: Cardiovascular Disease

## 2020-09-14 ENCOUNTER — Other Ambulatory Visit: Payer: Self-pay | Admitting: *Deleted

## 2020-09-14 ENCOUNTER — Other Ambulatory Visit: Payer: Self-pay

## 2020-09-14 VITALS — BP 140/80 | HR 74 | Ht 66.0 in | Wt 205.0 lb

## 2020-09-14 DIAGNOSIS — I4891 Unspecified atrial fibrillation: Secondary | ICD-10-CM | POA: Diagnosis not present

## 2020-09-14 DIAGNOSIS — I10 Essential (primary) hypertension: Secondary | ICD-10-CM | POA: Diagnosis not present

## 2020-09-14 DIAGNOSIS — I480 Paroxysmal atrial fibrillation: Secondary | ICD-10-CM | POA: Diagnosis not present

## 2020-09-14 DIAGNOSIS — E119 Type 2 diabetes mellitus without complications: Secondary | ICD-10-CM

## 2020-09-14 DIAGNOSIS — Z794 Long term (current) use of insulin: Secondary | ICD-10-CM

## 2020-09-14 MED ORDER — LOSARTAN POTASSIUM 100 MG PO TABS
50.0000 mg | ORAL_TABLET | Freq: Every day | ORAL | 3 refills | Status: DC
Start: 2020-09-14 — End: 2022-01-09

## 2020-09-14 MED ORDER — ATORVASTATIN CALCIUM 40 MG PO TABS: 40.0000 mg | ORAL_TABLET | Freq: Every evening | ORAL | 3 refills | Status: AC

## 2020-09-14 MED ORDER — APIXABAN 5 MG PO TABS: 5.0000 mg | ORAL_TABLET | Freq: Two times a day (BID) | ORAL | 11 refills | Status: DC

## 2020-09-14 MED ORDER — METOPROLOL TARTRATE 50 MG PO TABS
75.0000 mg | ORAL_TABLET | Freq: Two times a day (BID) | ORAL | 3 refills | Status: DC
Start: 2020-09-14 — End: 2021-10-23

## 2020-09-14 MED ORDER — ATORVASTATIN CALCIUM 40 MG PO TABS
40.0000 mg | ORAL_TABLET | Freq: Every evening | ORAL | 2 refills | Status: DC
Start: 2020-09-14 — End: 2020-09-14

## 2020-09-14 MED ORDER — APIXABAN 5 MG PO TABS: 5.0000 mg | ORAL_TABLET | Freq: Two times a day (BID) | ORAL | 3 refills | Status: DC

## 2020-09-14 MED ORDER — LOSARTAN POTASSIUM 100 MG PO TABS
50.0000 mg | ORAL_TABLET | Freq: Every day | ORAL | 0 refills | Status: DC
Start: 2020-09-14 — End: 2020-09-14

## 2020-09-14 NOTE — Patient Instructions (Addendum)
Medication Instructions:  Please increase the metoprolol up to 75 mg twice a day  If you need a refill on your cardiac medications before your next appointment, please call your pharmacy.    Lab work: No new labs needed   If you have labs (blood work) drawn today and your tests are completely normal, you will receive your results only by: Marland Kitchen MyChart Message (if you have MyChart) OR . A paper copy in the mail If you have any lab test that is abnormal or we need to change your treatment, we will call you to review the results.   Testing/Procedures: No new testing needed   Follow-Up: At [facility], you and your health needs are our priority.  As part of our continuing mission to provide you with exceptional heart care, we have created designated Provider Care Teams.  These Care Teams include your primary Cardiologist (physician) and Advanced Practice Providers (APPs -  Physician Assistants and Nurse Practitioners) who all work together to provide you with the care you need, when you need it.  . You will need a follow up appointment in 6 months  . Providers on your designated Care Team:   . Nicolasa Ducking, NP . Eula Listen, PA-C . Marisue Ivan, PA-C  Any Other Special Instructions Will Be Listed Below (If Applicable).  COVID-19 Vaccine Information can be found at: [email or web address] For questions related to vaccine distribution or appointments, please email [email or web address] or call [phone].

## 2020-09-22 ENCOUNTER — Other Ambulatory Visit: Payer: Self-pay

## 2020-09-22 MED ORDER — VERAPAMIL HCL ER 120 MG PO TBCR: 120.0000 mg | EXTENDED_RELEASE_TABLET | Freq: Every day | ORAL | 5 refills | Status: DC

## 2020-11-19 ENCOUNTER — Inpatient Hospital Stay
Admission: EM | Admit: 2020-11-19 | Discharge: 2020-11-21 | DRG: 291 | Disposition: A | Discharge status: Home / Self Care | Payer: Medicare Other | Attending: Internal Medicine | Admitting: Internal Medicine

## 2020-11-19 ENCOUNTER — Other Ambulatory Visit: Payer: Self-pay

## 2020-11-19 ENCOUNTER — Emergency Department: Payer: Medicare Other

## 2020-11-19 DIAGNOSIS — M1990 Unspecified osteoarthritis, unspecified site: Secondary | ICD-10-CM | POA: Diagnosis present

## 2020-11-19 DIAGNOSIS — E669 Obesity, unspecified: Secondary | ICD-10-CM | POA: Diagnosis present

## 2020-11-19 DIAGNOSIS — Z794 Long term (current) use of insulin: Secondary | ICD-10-CM | POA: Diagnosis not present

## 2020-11-19 DIAGNOSIS — I110 Hypertensive heart disease with heart failure: Secondary | ICD-10-CM | POA: Diagnosis not present

## 2020-11-19 DIAGNOSIS — Z79899 Other long term (current) drug therapy: Secondary | ICD-10-CM

## 2020-11-19 DIAGNOSIS — I493 Ventricular premature depolarization: Secondary | ICD-10-CM | POA: Diagnosis present

## 2020-11-19 DIAGNOSIS — J810 Acute pulmonary edema: Secondary | ICD-10-CM | POA: Diagnosis present

## 2020-11-19 DIAGNOSIS — E119 Type 2 diabetes mellitus without complications: Secondary | ICD-10-CM | POA: Diagnosis not present

## 2020-11-19 DIAGNOSIS — Z7901 Long term (current) use of anticoagulants: Secondary | ICD-10-CM

## 2020-11-19 DIAGNOSIS — I509 Heart failure, unspecified: Secondary | ICD-10-CM

## 2020-11-19 DIAGNOSIS — Z8249 Family history of ischemic heart disease and other diseases of the circulatory system: Secondary | ICD-10-CM

## 2020-11-19 DIAGNOSIS — E1165 Type 2 diabetes mellitus with hyperglycemia: Secondary | ICD-10-CM | POA: Diagnosis present

## 2020-11-19 DIAGNOSIS — Z6832 Body mass index (BMI) 32.0-32.9, adult: Secondary | ICD-10-CM

## 2020-11-19 DIAGNOSIS — J9601 Acute respiratory failure with hypoxia: Secondary | ICD-10-CM | POA: Diagnosis not present

## 2020-11-19 DIAGNOSIS — L732 Hidradenitis suppurativa: Secondary | ICD-10-CM | POA: Diagnosis present

## 2020-11-19 DIAGNOSIS — I10 Essential (primary) hypertension: Secondary | ICD-10-CM | POA: Diagnosis not present

## 2020-11-19 DIAGNOSIS — Z87891 Personal history of nicotine dependence: Secondary | ICD-10-CM

## 2020-11-19 DIAGNOSIS — I480 Paroxysmal atrial fibrillation: Secondary | ICD-10-CM | POA: Diagnosis not present

## 2020-11-19 DIAGNOSIS — I5033 Acute on chronic diastolic (congestive) heart failure: Secondary | ICD-10-CM | POA: Diagnosis present

## 2020-11-19 DIAGNOSIS — Z888 Allergy status to other drugs, medicaments and biological substances status: Secondary | ICD-10-CM | POA: Diagnosis not present

## 2020-11-19 DIAGNOSIS — J9600 Acute respiratory failure, unspecified whether with hypoxia or hypercapnia: Secondary | ICD-10-CM | POA: Diagnosis present

## 2020-11-19 DIAGNOSIS — I358 Other nonrheumatic aortic valve disorders: Secondary | ICD-10-CM | POA: Diagnosis present

## 2020-11-19 DIAGNOSIS — Z20822 Contact with and (suspected) exposure to covid-19: Secondary | ICD-10-CM | POA: Diagnosis present

## 2020-11-19 LAB — COMPREHENSIVE METABOLIC PANEL
ALT: 22 U/L (ref 0–44)
AST: 26 U/L (ref 15–41)
Albumin: 3.5 g/dL (ref 3.5–5.0)
Alkaline Phosphatase: 115 U/L (ref 38–126)
Anion gap: 9 (ref 5–15)
BUN: 24 mg/dL — ABNORMAL HIGH (ref 8–23)
CO2: 29 mmol/L (ref 22–32)
Calcium: 9.2 mg/dL (ref 8.9–10.3)
Chloride: 101 mmol/L (ref 98–111)
Creatinine, Ser: 0.88 mg/dL (ref 0.44–1.00)
GFR, Estimated: >60 mL/min (ref >60)
Glucose, Bld: 193 mg/dL — ABNORMAL HIGH (ref 70–99)
Potassium: 3.8 mmol/L (ref 3.5–5.1)
Sodium: 139 mmol/L (ref 135–145)
Total Bilirubin: 0.6 mg/dL (ref 0.3–1.2)
Total Protein: 7.9 g/dL (ref 6.5–8.1)

## 2020-11-19 LAB — PROTIME-INR
INR: 1.2 (ref 0.8–1.2)
Prothrombin Time: 14.4 seconds (ref 11.4–15.2)

## 2020-11-19 LAB — CBC WITH DIFFERENTIAL/PLATELET
Abs Immature Granulocytes: 0.04 10*3/uL (ref 0.00–0.07)
Basophils Absolute: 0.1 10*3/uL (ref 0.0–0.1)
Basophils Relative: 1 %
Eosinophils Absolute: 0.3 10*3/uL (ref 0.0–0.5)
Eosinophils Relative: 3 %
HCT: 31.3 % — ABNORMAL LOW (ref 36.0–46.0)
Hemoglobin: 10.4 g/dL — ABNORMAL LOW (ref 12.0–15.0)
Immature Granulocytes: 0 %
Lymphocytes Relative: 23 %
Lymphs Abs: 2.1 10*3/uL (ref 0.7–4.0)
MCH: 29.6 pg (ref 26.0–34.0)
MCHC: 33.2 g/dL (ref 30.0–36.0)
MCV: 89.2 fL (ref 80.0–100.0)
Monocytes Absolute: 0.5 10*3/uL (ref 0.1–1.0)
Monocytes Relative: 5 %
Neutro Abs: 6.3 10*3/uL (ref 1.7–7.7)
Neutrophils Relative %: 68 %
Platelets: 271 10*3/uL (ref 150–400)
RBC: 3.51 MIL/uL — ABNORMAL LOW (ref 3.87–5.11)
RDW: 13.4 % (ref 11.5–15.5)
WBC: 9.2 10*3/uL (ref 4.0–10.5)
nRBC: 0 % (ref 0.0–0.2)

## 2020-11-19 LAB — TROPONIN I (HIGH SENSITIVITY)
Troponin I (High Sensitivity): 11 ng/L (ref <18)
Troponin I (High Sensitivity): 9 ng/L (ref <18)

## 2020-11-19 LAB — BLOOD GAS, VENOUS
Acid-Base Excess: 4.5 mmol/L — ABNORMAL HIGH (ref 0.0–2.0)
Bicarbonate: 30.3 mmol/L — ABNORMAL HIGH (ref 20.0–28.0)
O2 Saturation: 86 %
Patient temperature: 37
pCO2, Ven: 50 mmHg (ref 44.0–60.0)
pH, Ven: 7.39 (ref 7.250–7.430)
pO2, Ven: 52 mmHg — ABNORMAL HIGH (ref 32.0–45.0)

## 2020-11-19 LAB — URINALYSIS, COMPLETE (UACMP) WITH MICROSCOPIC
Bilirubin Urine: NEGATIVE
Glucose, UA: NEGATIVE mg/dL
Hgb urine dipstick: NEGATIVE
Ketones, ur: NEGATIVE mg/dL
Leukocytes,Ua: NEGATIVE
Nitrite: NEGATIVE
Protein, ur: 100 mg/dL — AB
Specific Gravity, Urine: 1.011 (ref 1.005–1.030)
pH: 7 (ref 5.0–8.0)

## 2020-11-19 LAB — LACTIC ACID, PLASMA: Lactic Acid, Venous: 1.7 mmol/L (ref 0.5–1.9)

## 2020-11-19 LAB — GLUCOSE, CAPILLARY: Glucose-Capillary: 271 mg/dL — ABNORMAL HIGH (ref 70–99)

## 2020-11-19 LAB — CBG MONITORING, ED
Glucose-Capillary: 434 mg/dL — ABNORMAL HIGH (ref 70–99)
Glucose-Capillary: 345 mg/dL — ABNORMAL HIGH (ref 70–99)

## 2020-11-19 LAB — HEMOGLOBIN A1C
Hgb A1c MFr Bld: 6.9 % — ABNORMAL HIGH (ref 4.8–5.6)
Mean Plasma Glucose: 151.33 mg/dL

## 2020-11-19 LAB — BRAIN NATRIURETIC PEPTIDE: B Natriuretic Peptide: 108.8 pg/mL — ABNORMAL HIGH (ref 0.0–100.0)

## 2020-11-19 LAB — LIPASE, BLOOD: Lipase: 31 U/L (ref 11–51)

## 2020-11-19 LAB — SARS CORONAVIRUS 2 BY RT PCR (HOSPITAL ORDER, PERFORMED IN ~~LOC~~ HOSPITAL LAB): SARS Coronavirus 2: NEGATIVE

## 2020-11-19 MED ORDER — SODIUM CHLORIDE 0.9% FLUSH
3.0000 mL | Freq: Two times a day (BID) | INTRAVENOUS | Status: DC
  Administered 2020-11-19 – 2020-11-21 (×4): 3 mL via INTRAVENOUS

## 2020-11-19 MED ORDER — RISAQUAD PO CAPS
1.0000 | ORAL_CAPSULE | Freq: Every day | ORAL | Status: DC
  Administered 2020-11-19 – 2020-11-21 (×3): 1 via ORAL
  Filled 2020-11-19 (×3): qty 1

## 2020-11-19 MED ORDER — ONDANSETRON HCL 4 MG/2ML IJ SOLN: 4.0000 mg | Freq: Four times a day (QID) | INTRAMUSCULAR | Status: DC | PRN

## 2020-11-19 MED ORDER — FUROSEMIDE 10 MG/ML IJ SOLN
20.0000 mg | Freq: Once | INTRAMUSCULAR | Status: AC
  Administered 2020-11-19: 20 mg via INTRAVENOUS
  Filled 2020-11-19: qty 4

## 2020-11-19 MED ORDER — INSULIN DETEMIR 100 UNIT/ML ~~LOC~~ SOLN
10.0000 [IU] | Freq: Every day | SUBCUTANEOUS | Status: DC
  Administered 2020-11-19 – 2020-11-20 (×2): 10 [IU] via SUBCUTANEOUS
  Filled 2020-11-19 (×3): qty 0.1

## 2020-11-19 MED ORDER — INSULIN ASPART 100 UNIT/ML ~~LOC~~ SOLN
0.0000 [IU] | Freq: Three times a day (TID) | SUBCUTANEOUS | Status: DC
  Administered 2020-11-19: 11 [IU] via SUBCUTANEOUS
  Administered 2020-11-19: 15 [IU] via SUBCUTANEOUS
  Administered 2020-11-20: 11 [IU] via SUBCUTANEOUS
  Administered 2020-11-20 (×2): 5 [IU] via SUBCUTANEOUS
  Administered 2020-11-21: 3 [IU] via SUBCUTANEOUS
  Filled 2020-11-19 (×6): qty 1

## 2020-11-19 MED ORDER — APIXABAN 5 MG PO TABS
5.0000 mg | ORAL_TABLET | Freq: Two times a day (BID) | ORAL | Status: DC
  Administered 2020-11-19 – 2020-11-21 (×5): 5 mg via ORAL
  Filled 2020-11-19 (×5): qty 1

## 2020-11-19 MED ORDER — ATORVASTATIN CALCIUM 20 MG PO TABS
40.0000 mg | ORAL_TABLET | Freq: Every evening | ORAL | Status: DC
  Administered 2020-11-19 – 2020-11-20 (×2): 40 mg via ORAL
  Filled 2020-11-19 (×2): qty 2

## 2020-11-19 MED ORDER — CALCIUM CARBONATE-VITAMIN D 500-200 MG-UNIT PO TABS
1.0000 | ORAL_TABLET | Freq: Every day | ORAL | Status: DC
  Administered 2020-11-19 – 2020-11-21 (×3): 1 via ORAL
  Filled 2020-11-19 (×5): qty 1

## 2020-11-19 MED ORDER — ASCORBIC ACID 500 MG PO TABS
500.0000 mg | ORAL_TABLET | Freq: Every day | ORAL | Status: DC
Start: 2020-11-19 — End: 2020-11-21
  Administered 2020-11-19 – 2020-11-21 (×3): 500 mg via ORAL
  Filled 2020-11-19 (×3): qty 1

## 2020-11-19 MED ORDER — SODIUM CHLORIDE 0.9 % IV SOLN: 250.0000 mL | INTRAVENOUS | Status: DC | PRN

## 2020-11-19 MED ORDER — CALCIUM-VITAMIN D 500-200 MG-UNIT PO TABS
1.0000 | ORAL_TABLET | Freq: Every day | ORAL | Status: DC
  Filled 2020-11-19: qty 1

## 2020-11-19 MED ORDER — ACETAMINOPHEN 325 MG PO TABS
650.0000 mg | ORAL_TABLET | Freq: Four times a day (QID) | ORAL | Status: DC | PRN
  Administered 2020-11-19: 650 mg via ORAL
  Filled 2020-11-19: qty 2

## 2020-11-19 MED ORDER — METOPROLOL TARTRATE 50 MG PO TABS
75.0000 mg | ORAL_TABLET | Freq: Two times a day (BID) | ORAL | Status: DC
  Administered 2020-11-19 – 2020-11-21 (×5): 75 mg via ORAL
  Filled 2020-11-19 (×5): qty 1

## 2020-11-19 MED ORDER — SODIUM CHLORIDE 0.9% FLUSH: 3.0000 mL | INTRAVENOUS | Status: DC | PRN

## 2020-11-19 MED ORDER — FUROSEMIDE 10 MG/ML IJ SOLN
40.0000 mg | Freq: Every day | INTRAMUSCULAR | Status: DC
  Administered 2020-11-19 – 2020-11-21 (×3): 40 mg via INTRAVENOUS
  Filled 2020-11-19 (×3): qty 4

## 2020-11-19 MED ORDER — LOSARTAN POTASSIUM 50 MG PO TABS
50.0000 mg | ORAL_TABLET | Freq: Every day | ORAL | Status: DC
  Administered 2020-11-19 – 2020-11-21 (×3): 50 mg via ORAL
  Filled 2020-11-19 (×3): qty 1

## 2020-11-19 NOTE — ED Notes (Signed)
Pt given remote for TV 

## 2020-11-19 NOTE — ED Provider Notes (Signed)
[facility] Emergency Department Provider Note  ____________________________________________   Event Date/Time   First MD Initiated Contact with Patient 11/19/20 [phone]     (approximate)  I have reviewed the triage vital signs and the nursing notes.   HISTORY  Chief Complaint Shortness of Breath    HPI Karen Murphy is a 70 y.o. female with medical history as listed below who presents [facility] for evaluation of acute onset dyspnea.  She says she used to be on fluid pills but was taken off of them last year when she was told she does not have congestive heart failure.  She has been feeling fine up until yesterday when she started to feel little bit short of breath.  Last night she woke up and felt like she could not breathe.  Nothing in particular made it feel better or worse.  EMS arrived and found that she was 76% on room air.  Her sats came up to the mid upper 90s on 4 L of oxygen by nasal cannula and she says she feels much better.  She has not noticed any swelling in her legs.  She denies chest pain, nausea, vomiting, and abdominal pain.  No numbness nor tingling in her extremities.  No recent fever or sore throat.  She is fully vaccinated against COVID-19.  The onset of the symptoms was acute and her symptoms were severe.         Past Medical History:  Diagnosis Date  . Anemia   . Arthritis   . Diabetes mellitus without complication (HCC)   . Hypertension     Patient Active Problem List   Diagnosis Date Noted  . Flash pulmonary edema (HCC) 11/19/2020  . Atrial fibrillation with rapid ventricular response (HCC) 07/24/2020  . Syncope and collapse 07/24/2020  . Community acquired pneumonia of right lower lobe of lung   . Essential hypertension   . Acute respiratory failure with hypoxia (HCC) 04/13/2020  . Lobar pneumonia (HCC)   . Type 2 diabetes mellitus without complication, with long-term current use of insulin (HCC)     History reviewed. No  pertinent surgical history.  Prior to Admission medications   Medication Sig Start Date End Date Taking? Authorizing Provider  acetaminophen (TYLENOL) 325 MG tablet Take 650 mg by mouth every 6 (six) hours as needed for mild pain or moderate pain.    [provider]  apixaban (ELIQUIS) 5 MG TABS tablet Take 1 tablet (5 mg total) by mouth 2 (two) times daily. 09/14/20   Antonieta Iba, MD  ascorbic acid (VITAMIN C) 500 MG tablet Take 500 mg by mouth daily.    [provider]  atorvastatin (LIPITOR) 40 MG tablet Take 1 tablet (40 mg total) by mouth every evening. 09/14/20   Antonieta Iba, MD  Calcium Carbonate-Vitamin D3 (CALCIUM 600/VITAMIN D) 600-400 MG-UNIT TABS Take 1 tablet by mouth daily.    [provider]  cephALEXin (KEFLEX) 500 MG capsule Take 500 mg by mouth 2 (two) times daily.    [provider]  glipiZIDE (GLUCOTROL XL) 10 MG 24 hr tablet Take 20 mg by mouth daily. 02/03/20   [provider]  Lactobacillus-Inulin (PROBIOTIC DIGESTIVE SUPPORT) CAPS Take 1 capsule by mouth daily.    [provider]  losartan (COZAAR) 100 MG tablet Take 0.5 tablets (50 mg total) by mouth daily. 09/14/20   Antonieta Iba, MD  metFORMIN (GLUCOPHAGE-XR) 500 MG 24 hr tablet Take 1,500 mg by mouth daily with  supper. 10/19/19   [provider]  metoprolol tartrate (LOPRESSOR) 50 MG tablet Take 1.5 tablets (75 mg total) by mouth 2 (two) times daily. 09/14/20   Antonieta Iba, MD  naproxen sodium (ALEVE) 220 MG tablet Take 220 mg by mouth every 12 (twelve) hours as needed (pain).    [provider]  ondansetron (ZOFRAN) 8 MG tablet TAKE 1 TABLET BY MOUTH  EVERY 8 HOURS AS NEEDED FOR NAUSEA AND VOMITING 08/26/20   [provider]  OZEMPIC, 0.25 OR 0.5 MG/DOSE, 2 MG/1.5ML SOPN Inject 0.5 mg into the skin every Monday. 03/22/20   [provider]  TOUJEO SOLOSTAR 300 UNIT/ML Solostar Pen Inject 18 Units into the skin at  bedtime. 01/04/20   [provider]  verapamil (CALAN-SR) 120 MG CR tablet Take 1 tablet (120 mg total) by mouth daily. 09/22/20 10/22/20  Antonieta Iba, MD    Allergies Diltiazem  Family History  Problem Relation Age of Onset  . Hypertension Mother   . Hypertension Father     Social History Social History   Tobacco Use  . Smoking status: Former Games developer  . Smokeless tobacco: Never Used    Review of Systems Constitutional: No fever/chills Eyes: No visual changes. ENT: No sore throat. Cardiovascular: Denies chest pain. Respiratory: Positive for shortness of breath. Gastrointestinal: No abdominal pain.  No nausea, no vomiting.  No diarrhea.  No constipation. Genitourinary: Negative for dysuria. Musculoskeletal: Negative for neck pain.  Negative for back pain. Integumentary: Negative for rash. Neurological: Negative for headaches, focal weakness or numbness.   ____________________________________________   PHYSICAL EXAM:  VITAL SIGNS: ED Triage Vitals  Enc Vitals Group     BP 11/19/20 0400 (!) 192/88     Pulse Rate 11/19/20 0400 78     Resp 11/19/20 0400 (!) 22     Temp 11/19/20 0400 97.7 F (36.5 C)     Temp Source 11/19/20 0400 Oral     SpO2 11/19/20 0400 95 %     Weight 11/19/20 0404 93 kg (205 lb)     Height 11/19/20 0404 1.676 m (5\' 6" )     Head Circumference --      Peak Flow --      Pain Score 11/19/20 0403 0     Pain Loc --      Pain Edu? --      Excl. in GC? --     Constitutional: Alert and oriented.  Eyes: Conjunctivae are normal.  Head: Atraumatic. Nose: No congestion/rhinnorhea. Mouth/Throat: Patient is wearing a mask. Neck: No stridor.  No meningeal signs.   Cardiovascular: Normal rate, regular rhythm. Good peripheral circulation. Respiratory: Normal respiratory effort on 4 L of oxygen by nasal cannula.  No wheezing or accessory muscle usage. Gastrointestinal: Soft and nontender. No distention.  Musculoskeletal: Trace pitting edema  in bilateral lower extremities but it is minimal. No gross deformities of extremities. Neurologic:  Normal speech and language. No gross focal neurologic deficits are appreciated.  Skin:  Skin is warm, dry and intact. Psychiatric: Mood and affect are normal. Speech and behavior are normal.  ____________________________________________   LABS (all labs ordered are listed, but only abnormal results are displayed)  Labs Reviewed  COMPREHENSIVE METABOLIC PANEL - Abnormal; Notable for the following components:      Result Value   Glucose, Bld 193 (*)    BUN 24 (*)    All other components within normal limits  CBC WITH DIFFERENTIAL/PLATELET - Abnormal; Notable for the following components:  RBC 3.51 (*)    Hemoglobin 10.4 (*)    HCT 31.3 (*)    All other components within normal limits  URINALYSIS, COMPLETE (UACMP) WITH MICROSCOPIC - Abnormal; Notable for the following components:   Color, Urine STRAW (*)    APPearance HAZY (*)    Protein, ur 100 (*)    Bacteria, UA RARE (*)    All other components within normal limits  BLOOD GAS, VENOUS - Abnormal; Notable for the following components:   pO2, Ven 52.0 (*)    Bicarbonate 30.3 (*)    Acid-Base Excess 4.5 (*)    All other components within normal limits  BRAIN NATRIURETIC PEPTIDE - Abnormal; Notable for the following components:   B Natriuretic Peptide 108.8 (*)    All other components within normal limits  SARS CORONAVIRUS 2 BY RT PCR (HOSPITAL ORDER, PERFORMED IN [facility])  LACTIC ACID, PLASMA  PROTIME-INR  LIPASE, BLOOD   ____________________________________________  EKG  ED ECG REPORT I, Loleta Rose, the attending physician, personally viewed and interpreted this ECG.  Date: 11/19/2020 EKG Time: 4:02 Rate: 79 Rhythm: normal sinus rhythm with occasional PVCs QRS Axis: normal Intervals: normal ST/T Wave abnormalities: Non-specific ST segment / T-wave changes, but no clear evidence of acute  ischemia. Narrative Interpretation: no definitive evidence of acute ischemia; does not meet STEMI criteria.   ____________________________________________  RADIOLOGY I, Loleta Rose, personally viewed and evaluated these images (plain radiographs) as part of my medical decision making, as well as reviewing the written report by the radiologist.  ED MD interpretation: Pulmonary edema  Official radiology report(s): DG Chest 2 View  Result Date: 11/19/2020 CLINICAL DATA:  Shortness of breath. EXAM: CHEST - 2 VIEW COMPARISON:  07/24/2020 FINDINGS: Symmetric interstitial and airspace opacity with basilar predilection. Normal heart size for portable technique. No definite effusion. No air leak. IMPRESSION: Symmetric pulmonary opacity favoring CHF. Electronically Signed   By: Marnee Spring M.D.   On: 11/19/2020 04:39    ____________________________________________   PROCEDURES   Procedure(s) performed (including Critical Care):  .Critical Care Performed by: Loleta Rose, MD Authorized by: Loleta Rose, MD   Critical care provider statement:    Critical care time (minutes):  30   Critical care time was exclusive of:  Separately billable procedures and treating other patients   Critical care was necessary to treat or prevent imminent or life-threatening deterioration of the following conditions:  Respiratory failure and circulatory failure   Critical care was time spent personally by me on the following activities:  Development of treatment plan with patient or surrogate, discussions with consultants, evaluation of patient's response to treatment, examination of patient, obtaining history from patient or surrogate, ordering and performing treatments and interventions, ordering and review of laboratory studies, ordering and review of radiographic studies, pulse oximetry, re-evaluation of patient's condition and review of old charts .1-3 Lead EKG Interpretation Performed by: Loleta Rose,  MD Authorized by: Loleta Rose, MD     Interpretation: non-specific     ECG rate:  80   ECG rate assessment: normal     Rhythm: sinus rhythm     Ectopy: PVCs     Conduction: normal       ____________________________________________   INITIAL IMPRESSION / MDM / ASSESSMENT AND PLAN / ED COURSE  As part of my medical decision making, I reviewed the following data within the electronic MEDICAL RECORD NUMBER Nursing notes reviewed and incorporated, Labs reviewed , EKG interpreted , Old chart reviewed, Radiograph reviewed ,  Discussed with admitting physician (Dr. Para March) and Notes from prior ED visits   Differential diagnosis includes, but is not limited to, CHF exacerbation, COPD, ACS, COVID-19, pneumonia, pneumothorax.  The patient is on the cardiac monitor to evaluate for evidence of arrhythmia and/or significant heart rate changes.  Chest x-ray is pending and lab work is pending.  Low suspicion for acute infection.  Patient is very comfortable on 4 L of oxygen by nasal cannula.  Her description of the symptoms makes me suspect CHF.  Well score for PE is 0.  Low risk for ACS and the patient is not having any chest pain.  I will evaluate with labs, chest x-ray, VBG to check for hypercapnia, etc.  Anticipate admission given acute respiratory failure with hypoxemia.  Patient understands and agrees with the plan.     Clinical Course as of 11/19/20 0806  Sat Nov 19, 2020  0441 CBC WITH DIFFERENTIAL(!) Essentially normal CBC [CF]  0441 Lactic acid, plasma Normal lactic acid [CF]  0450 DG Chest 2 View I personally reviewed the patient's imaging and agree with the radiologist's interpretation that the patient is most likely suffering from CHF. [CF]  0535 SARS Coronavirus 2: NEGATIVE [CF]  0541 Blood gas, venous(!) Generally reassuring VBG with no hypercapnia [CF]  0609 Urinalysis, Complete w Microscopic Urine, Clean Catch(!) No acute abnormalities on urinalysis. UA [CF]  [id number] Patient's  work-up is generally reassuring other than the hypoxemia with new oxygen requirement and the chest x-ray demonstrating pulmonary edema.  Her blood pressure is generally well controlled.  I reviewed her medical record and saw that an echocardiogram last year demonstrated an ejection fraction of 60 to 65%.  However I also verified in the record that it does not seem she is on any diuretics.  I ordered furosemide 20 mg IV to begin the diuresis and have consulted the hospitalist for admission. [CF]  0622 Discussed case by phone with Dr. Para March.  The hospitalist service will admit. [CF]    Clinical Course User Index [CF] Loleta Rose, MD     ____________________________________________  FINAL CLINICAL IMPRESSION(S) / ED DIAGNOSES  Final diagnoses:  New onset of congestive heart failure (HCC)  Acute respiratory failure with hypoxemia (HCC)     MEDICATIONS GIVEN DURING THIS VISIT:  Medications  furosemide (LASIX) injection 20 mg (20 mg Intravenous Given 11/19/20 0654)     ED Discharge Orders    None      *Please note:  Piya Mesch was evaluated in Emergency Department on 11/19/2020 for the symptoms described in the history of present illness. She was evaluated in the context of the global COVID-19 pandemic, which necessitated consideration that the patient might be at risk for infection with the SARS-CoV-2 virus that causes COVID-19. Institutional protocols and algorithms that pertain to the evaluation of patients at risk for COVID-19 are in a state of rapid change based on information released by regulatory bodies including the CDC and federal and state organizations. These policies and algorithms were followed during the patient's care in the ED.  Some ED evaluations and interventions may be delayed as a result of limited staffing during and after the pandemic.*  Note:  This document was prepared using Dragon voice recognition software and may include unintentional dictation errors.    Loleta Rose, MD 11/19/20 [phone]

## 2020-11-19 NOTE — ED Notes (Signed)
Dr. Duncan at bedside 

## 2020-11-19 NOTE — ED Notes (Signed)
Patient ambulated to the restroom

## 2020-11-19 NOTE — ED Notes (Signed)
Pt up to use bathroom- denies any other needs at this time

## 2020-11-19 NOTE — ED Triage Notes (Signed)
Pt presents to ER via [facility] with c/o SOB that started this AM when she woke up to use restroom.  Pt initially 76% on RA with FD.  Pt given 2 duoneb and 125 mg solumedrol en route.  Pt denies hx of COPD or COVID exposure.  Pt fully vaccinated against COVID.  Pt able to speak in full sentences in triage.

## 2020-11-19 NOTE — ED Notes (Signed)
Pt ambulated to restroom with minimal assistance.  Pt did not appear SOB when ambulating to toilet.

## 2020-11-19 NOTE — ED Notes (Signed)
Pt given water, graham crackers, peanut butter, and applesauce

## 2020-11-19 NOTE — ED Notes (Signed)
CBG was 434, per protocol messaged MD who advised they will add levemir on top of the 15 units of novolog to cover patient.

## 2020-11-19 NOTE — ED Notes (Signed)
Message sent to pharmacy to verify medications 

## 2020-11-19 NOTE — H&P (Signed)
History and Physical    Karen Murphy NHA:[id number] DOB: 01/31/1951 DOA: 11/19/2020  PCP: Karen Czech, MD   Patient coming from: Home  I have personally briefly reviewed patient's old medical records in [facility]  Chief Complaint: Shortness of breath  HPI: Karen Murphy is a 70 y.o. female with medical history significant for paroxysmal atrial fibrillation, hypertension, diabetes mellitus who presents to the ER via EMS for evaluation of shortness of breath.  Patient states that she was in her usual state of health and had gone to bed at about 8 PM. She woke up at about 11 PM because she had difficulty breathing.  She used her Nettie pot without any improvement in her symptoms and called EMS due to worsening symptoms.  Patient states that she was unable to lay flat.  When EMS arrived patient was found to have room air pulse oximetry of 76%.  EMS administered 2 DuoNeb and Solu-Medrol 125 mg in route to the hospital and she was placed on 4 L of oxygen with improvement in her pulse oximetry to 94%. Patient denies having any known history of CHF.  She denies having any palpitations, no diaphoresis, no sore throat no chest pain, no lower extremity swelling, no nausea, no vomiting, no diaphoresis, no abdominal pain, no urinary frequency, no nocturia, no dysuria, no headache, no fever, no cough no abdominal pain no changes in her bowel habits Venous blood gas 7.39/50/52/30/86  Sodium 139, potassium 3.8, chloride 101, bicarb 29, glucose 193, BUN 24, creatinine 0.8, calcium 9.2, albumin 3.5, lipase 31, AST 26, ALT 22, BNP 108, lactic acid 1.7, white count 9.2, hemoglobin 10.4, hematocrit 31.3, MCV 89.2, RDW 13.4, platelet count 271, PT 14.4, INR 1.2 Her SARS coronavirus 2 PCR test is negative Chest x-ray reviewed by me shows bilateral haziness consistent with CHF Twelve-lead EKG reviewed by me shows sinus rhythm with PVCs   ED Course: Patient is a 70 year old female who presents to the emergency  room via EMS for evaluation of sudden onset shortness of breath.  Chest x-ray is consistent with pulmonary edema.  Patient will be admitted to the hospital for further evaluation.  Review of Systems: As per HPI otherwise all systems reviewed and negative.    Past Medical History:  Diagnosis Date  . Anemia   . Arthritis   . Diabetes mellitus without complication (HCC)   . Hypertension     History reviewed. No pertinent surgical history.   reports that she has quit smoking. She has never used smokeless tobacco. No history on file for alcohol use and drug use.  Allergies  Allergen Reactions  . Diltiazem Rash    Family History  Problem Relation Age of Onset  . Hypertension Mother   . Hypertension Father      Prior to Admission medications   Medication Sig Start Date End Date Taking? Authorizing Provider  acetaminophen (TYLENOL) 325 MG tablet Take 650 mg by mouth every 6 (six) hours as needed for mild pain or moderate pain.    [provider]  apixaban (ELIQUIS) 5 MG TABS tablet Take 1 tablet (5 mg total) by mouth 2 (two) times daily. 09/14/20   Karen Iba, MD  ascorbic acid (VITAMIN C) 500 MG tablet Take 500 mg by mouth daily.    [provider]  atorvastatin (LIPITOR) 40 MG tablet Take 1 tablet (40 mg total) by mouth every evening. 09/14/20   Karen Iba, MD  Calcium Carbonate-Vitamin D3 (CALCIUM 600/VITAMIN D) 600-400 MG-UNIT  TABS Take 1 tablet by mouth daily.    [provider]  cephALEXin (KEFLEX) 500 MG capsule Take 500 mg by mouth 2 (two) times daily.    [provider]  glipiZIDE (GLUCOTROL XL) 10 MG 24 hr tablet Take 20 mg by mouth daily. 02/03/20   [provider]  Lactobacillus-Inulin (PROBIOTIC DIGESTIVE SUPPORT) CAPS Take 1 capsule by mouth daily.    [provider]  losartan (COZAAR) 100 MG tablet Take 0.5 tablets (50 mg total) by mouth daily. 09/14/20   Karen Iba, MD  metFORMIN (GLUCOPHAGE-XR) 500  MG 24 hr tablet Take 1,500 mg by mouth daily with supper. 10/19/19   [provider]  metoprolol tartrate (LOPRESSOR) 50 MG tablet Take 1.5 tablets (75 mg total) by mouth 2 (two) times daily. 09/14/20   Karen Iba, MD  naproxen sodium (ALEVE) 220 MG tablet Take 220 mg by mouth every 12 (twelve) hours as needed (pain).    [provider]  ondansetron (ZOFRAN) 8 MG tablet TAKE 1 TABLET BY MOUTH  EVERY 8 HOURS AS NEEDED FOR NAUSEA AND VOMITING 08/26/20   [provider]  OZEMPIC, 0.25 OR 0.5 MG/DOSE, 2 MG/1.5ML SOPN Inject 0.5 mg into the skin every Monday. 03/22/20   [provider]  TOUJEO SOLOSTAR 300 UNIT/ML Solostar Pen Inject 18 Units into the skin at bedtime. 01/04/20   [provider]  verapamil (CALAN-SR) 120 MG CR tablet Take 1 tablet (120 mg total) by mouth daily. 09/22/20 10/22/20  Karen Iba, MD    Physical Exam: Vitals:   11/19/20 0700 11/19/20 0730 11/19/20 0800 11/19/20 0830  BP: (!) 157/87 (!) 151/74 (!) 173/83 (!) 166/76  Pulse: 75 73 84 82  Resp: 16 15 (!) 21 14  Temp:      TempSrc:      SpO2: 97% 98% 99% 98%  Weight:      Height:         Vitals:   11/19/20 0700 11/19/20 0730 11/19/20 0800 11/19/20 0830  BP: (!) 157/87 (!) 151/74 (!) 173/83 (!) 166/76  Pulse: 75 73 84 82  Resp: 16 15 (!) 21 14  Temp:      TempSrc:      SpO2: 97% 98% 99% 98%  Weight:      Height:        Constitutional: NAD, alert and oriented x 3 Eyes: PERRL, lids and conjunctivae normal ENMT: Mucous membranes are moist.  Neck: normal, supple, no masses, no thyromegaly Respiratory: Bilateral air entry, no wheezing, crackles at the bases. Normal respiratory effort. No accessory muscle use.  Cardiovascular: Regular rate and rhythm, no murmurs / rubs / gallops. No extremity edema. 2+ pedal pulses. No carotid bruits.  Abdomen: no tenderness, no masses palpated. No hepatosplenomegaly. Bowel sounds positive.  Musculoskeletal: no clubbing / cyanosis.  No joint deformity upper and lower extremities.  Skin: no rashes, lesions, ulcers.  Neurologic: No gross focal neurologic deficit. Psychiatric: Normal mood and affect.   Labs on Admission: I have personally reviewed following labs and imaging studies  CBC: Recent Labs  Lab 11/19/20 0359  WBC 9.2  NEUTROABS 6.3  HGB 10.4*  HCT 31.3*  MCV 89.2  PLT 271   Basic Metabolic Panel: Recent Labs  Lab 11/19/20 0359  NA 139  K 3.8  CL 101  CO2 29  GLUCOSE 193*  BUN 24*  CREATININE 0.88  CALCIUM 9.2   GFR: Estimated Creatinine Clearance: 68.4 mL/min (by C-G formula based on SCr  of 0.88 mg/dL). Liver Function Tests: Recent Labs  Lab 11/19/20 0359  AST 26  ALT 22  ALKPHOS 115  BILITOT 0.6  PROT 7.9  ALBUMIN 3.5   Recent Labs  Lab 11/19/20 0359  LIPASE 31   No results for input(s): AMMONIA in the last 168 hours. Coagulation Profile: Recent Labs  Lab 11/19/20 0359  INR 1.2   Cardiac Enzymes: No results for input(s): CKTOTAL, CKMB, CKMBINDEX, TROPONINI in the last 168 hours. BNP (last 3 results) No results for input(s): PROBNP in the last 8760 hours. HbA1C: No results for input(s): HGBA1C in the last 72 hours. CBG: No results for input(s): GLUCAP in the last 168 hours. Lipid Profile: No results for input(s): CHOL, HDL, LDLCALC, TRIG, CHOLHDL, LDLDIRECT in the last 72 hours. Thyroid Function Tests: No results for input(s): TSH, T4TOTAL, FREET4, T3FREE, THYROIDAB in the last 72 hours. Anemia Panel: No results for input(s): VITAMINB12, FOLATE, FERRITIN, TIBC, IRON, RETICCTPCT in the last 72 hours. Urine analysis:    Component Value Date/Time   COLORURINE STRAW (A) 11/19/2020 0535   APPEARANCEUR HAZY (A) 11/19/2020 0535   LABSPEC 1.011 11/19/2020 0535   PHURINE 7.0 11/19/2020 0535   GLUCOSEU NEGATIVE 11/19/2020 0535   HGBUR NEGATIVE 11/19/2020 0535   BILIRUBINUR NEGATIVE 11/19/2020 0535   KETONESUR NEGATIVE 11/19/2020 0535   PROTEINUR 100 (A) 11/19/2020  0535   NITRITE NEGATIVE 11/19/2020 0535   LEUKOCYTESUR NEGATIVE 11/19/2020 0535    Radiological Exams on Admission: DG Chest 2 View  Result Date: 11/19/2020 CLINICAL DATA:  Shortness of breath. EXAM: CHEST - 2 VIEW COMPARISON:  07/24/2020 FINDINGS: Symmetric interstitial and airspace opacity with basilar predilection. Normal heart size for portable technique. No definite effusion. No air leak. IMPRESSION: Symmetric pulmonary opacity favoring CHF. Electronically Signed   By: Marnee Spring M.D.   On: 11/19/2020 04:39    EKG: Independently reviewed.  Normal sinus rhythm PVCs  Assessment/Plan Principal Problem:   Flash pulmonary edema (HCC) Active Problems:   Type 2 diabetes mellitus without complication, with long-term current use of insulin (HCC)   Essential hypertension   AF (paroxysmal atrial fibrillation) (HCC)      Acute respiratory failure secondary to flash pulmonary edema Patient presents to the ER for evaluation of new onset shortness of breath and was hypoxic in the field with room air pulse oximetry of 76%. Patient had increased work of breathing with use of accessory muscles and was placed on 4 L of oxygen with improvement in her pulse oximetry to 94% Chest x-ray showed pulmonary edema Optimize blood pressure control Continue carvedilol and losartan. Patient had a recent 2D echocardiogram which shows an LVEF of 60 to 65% Place patient on Lasix 40 mg IV daily We will attempt to wean patient off oxygen as tolerated    Paroxysmal atrial fibrillation Patient is currently in sinus rhythm Continue metoprolol Continue apixaban as primary prophylaxis for an acute stroke     Diabetes mellitus Maintain consistent carbohydrate diet Sliding scale insulin with Accu-Cheks before meals and at bedtime Hold oral hypoglycemic agents    Hypertension Continue carvedilol, losartan and verapamil   DVT prophylaxis: Apixaban Code Status: Full code Family Communication:  Greater than 50% of time was spent discussing patient's condition and plan of care.  All questions and concerns have been addressed.  She verbalizes understanding and agrees with the plan. Disposition Plan: Back to previous home environment Consults called: None    Delyle Weider MD [facility]     11/19/2020, 9:14 AM

## 2020-11-20 ENCOUNTER — Encounter: Payer: Self-pay | Admitting: Internal Medicine

## 2020-11-20 DIAGNOSIS — I10 Essential (primary) hypertension: Secondary | ICD-10-CM

## 2020-11-20 LAB — GLUCOSE, CAPILLARY
Glucose-Capillary: 211 mg/dL — ABNORMAL HIGH (ref 70–99)
Glucose-Capillary: 335 mg/dL — ABNORMAL HIGH (ref 70–99)
Glucose-Capillary: 217 mg/dL — ABNORMAL HIGH (ref 70–99)
Glucose-Capillary: 219 mg/dL — ABNORMAL HIGH (ref 70–99)

## 2020-11-20 LAB — BASIC METABOLIC PANEL
Anion gap: 11 (ref 5–15)
BUN: 21 mg/dL (ref 8–23)
CO2: 26 mmol/L (ref 22–32)
Calcium: 9.5 mg/dL (ref 8.9–10.3)
Chloride: 97 mmol/L — ABNORMAL LOW (ref 98–111)
Creatinine, Ser: 0.65 mg/dL (ref 0.44–1.00)
GFR, Estimated: >60 mL/min (ref >60)
Glucose, Bld: 247 mg/dL — ABNORMAL HIGH (ref 70–99)
Potassium: 3.7 mmol/L (ref 3.5–5.1)
Sodium: 134 mmol/L — ABNORMAL LOW (ref 135–145)

## 2020-11-20 NOTE — Progress Notes (Addendum)
Progress Note    Karen Murphy  XTG:[id number] DOB: 06-10-1951  DOA: 11/19/2020 PCP: Garnette Czech, MD      Brief Narrative:    Medical records reviewed and are as summarized below:  Karen Murphy is a 70 y.o. female with medical history significant for paroxysmal atrial fibrillation, hypertension, type II DM, presented to the hospital because of shortness of breath.  When EMS arrived apparently, her oxygen saturation was 76% on room air.  She was admitted to the hospital for acute exacerbation of chronic diastolic CHF and acute hypoxic respiratory failure.  She was treated with IV Lasix and oxygen.    Assessment/Plan:   Principal Problem:   Flash pulmonary edema (HCC) Active Problems:   Acute respiratory failure (HCC)   Type 2 diabetes mellitus without complication, with long-term current use of insulin (HCC)   Essential hypertension   AF (paroxysmal atrial fibrillation) (HCC)    Body mass index is 32.77 kg/m.  (Obesity)   Acute on chronic diastolic CHF: Continue IV Lasix.  Monitor daily weight, BMP and urine output.  2D echo in October 2021 showed EF estimated at 66 5%, normal LV diastolic parameters  Acute hypoxic respiratory failure: Improved.  She is tolerating room air.  Hypertension: Continue antihypertensives  Paroxysmal atrial fibrillation: Continue metoprolol and Eliquis  Type II DM with hyperglycemia: Continue Levemir and NovoLog.  She takes Metformin, glipizide and Trulicity at home and these have been held.  Diet Order            Diet Carb Modified Fluid consistency: Thin; Room service appropriate? Yes  Diet effective now                    Consultants:  None  Procedures:  None    Medications:   . acidophilus  1 capsule Oral Daily  . apixaban  5 mg Oral BID  . ascorbic acid  500 mg Oral Daily  . atorvastatin  40 mg Oral QPM  . calcium-vitamin D  1 tablet Oral Q breakfast  . furosemide  40 mg Intravenous Daily  . insulin  aspart  0-15 Units Subcutaneous TID WC  . insulin detemir  10 Units Subcutaneous Daily  . losartan  50 mg Oral Daily  . metoprolol tartrate  75 mg Oral BID  . sodium chloride flush  3 mL Intravenous Q12H   Continuous Infusions: . sodium chloride       Anti-infectives (From admission, onward)   None             Family Communication/Anticipated D/C date and plan/Code Status   DVT prophylaxis:  apixaban (ELIQUIS) tablet 5 mg     Code Status: Full Code  Family Communication: None Disposition Plan:    Status is: Inpatient  Remains inpatient appropriate because:IV treatments appropriate due to intensity of illness or inability to take PO and Inpatient level of care appropriate due to severity of illness   Dispo: The patient is from: Home              Anticipated d/c is to: Home              Anticipated d/c date is: 1 day              Patient currently is not medically stable to d/c.   Difficult to place patient No           Subjective:   C/o shortness of breath.  She is a little  better today.  Objective:    Vitals:   11/20/20 0013 11/20/20 0445 11/20/20 0750 11/20/20 1126  BP: (!) 162/82 (!) 146/74 (!) 149/81 138/73  Pulse: 82 76 70 69  Resp: 17 17 18 18   Temp: (!) 97.5 F (36.4 C) 98.2 F (36.8 C) 97.9 F (36.6 C) 97.6 F (36.4 C)  TempSrc:   Oral   SpO2: 96% 96% 97% 97%  Weight:  92.1 kg    Height:       No data found.   Intake/Output Summary (Last 24 hours) at 11/20/2020 1614 Last data filed at 11/20/2020 1338 Gross per 24 hour  Intake 360 ml  Output 1300 ml  Net -940 ml   Filed Weights   11/19/20 0404 11/20/20 0445  Weight: 93 kg 92.1 kg    Exam:  GEN: NAD SKIN: Warm and dry EYES: No pallor or icterus ENT: MMM CV: RRR PULM: CTA B ABD: soft, obese, NT, +BS CNS: AAO x 3, non focal EXT: No edema or tenderness        Data Reviewed:   I have personally reviewed following labs and imaging studies:  Labs: Labs show  the following:   Basic Metabolic Panel: Recent Labs  Lab 11/19/20 0359 11/20/20 0408  NA 139 134*  K 3.8 3.7  CL 101 97*  CO2 29 26  GLUCOSE 193* 247*  BUN 24* 21  CREATININE 0.88 0.65  CALCIUM 9.2 9.5   GFR Estimated Creatinine Clearance: 74.8 mL/min (by C-G formula based on SCr of 0.65 mg/dL). Liver Function Tests: Recent Labs  Lab 11/19/20 0359  AST 26  ALT 22  ALKPHOS 115  BILITOT 0.6  PROT 7.9  ALBUMIN 3.5   Recent Labs  Lab 11/19/20 0359  LIPASE 31   No results for input(s): AMMONIA in the last 168 hours. Coagulation profile Recent Labs  Lab 11/19/20 0359  INR 1.2    CBC: Recent Labs  Lab 11/19/20 0359  WBC 9.2  NEUTROABS 6.3  HGB 10.4*  HCT 31.3*  MCV 89.2  PLT 271   Cardiac Enzymes: No results for input(s): CKTOTAL, CKMB, CKMBINDEX, TROPONINI in the last 168 hours. BNP (last 3 results) No results for input(s): PROBNP in the last 8760 hours. CBG: Recent Labs  Lab 11/19/20 1216 11/19/20 1614 11/19/20 2247 11/20/20 0850 11/20/20 1125  GLUCAP 434* 345* 271* 211* 335*   D-Dimer: No results for input(s): DDIMER in the last 72 hours. Hgb A1c: Recent Labs    11/19/20 0920  HGBA1C 6.9*   Lipid Profile: No results for input(s): CHOL, HDL, LDLCALC, TRIG, CHOLHDL, LDLDIRECT in the last 72 hours. Thyroid function studies: No results for input(s): TSH, T4TOTAL, T3FREE, THYROIDAB in the last 72 hours.  Invalid input(s): FREET3 Anemia work up: No results for input(s): VITAMINB12, FOLATE, FERRITIN, TIBC, IRON, RETICCTPCT in the last 72 hours. Sepsis Labs: Recent Labs  Lab 11/19/20 0359  WBC 9.2  LATICACIDVEN 1.7    Microbiology Recent Results (from the past 240 hour(s))  SARS Coronavirus 2 by RT PCR (hospital order, performed in [facility] hospital lab) Nasopharyngeal Nasopharyngeal Swab     Status: None   Collection Time: 11/19/20  3:59 AM   Specimen: Nasopharyngeal Swab  Result Value Ref Range Status   SARS Coronavirus 2  NEGATIVE NEGATIVE Final    Comment: (NOTE) SARS-CoV-2 target nucleic acids are NOT DETECTED.  The SARS-CoV-2 RNA is generally detectable in upper and lower respiratory specimens during the acute phase of infection. The lowest concentration of  SARS-CoV-2 viral copies this assay can detect is 250 copies / mL. A negative result does not preclude SARS-CoV-2 infection and should not be used as the sole basis for treatment or other patient management decisions.  A negative result may occur with improper specimen collection / handling, submission of specimen other than nasopharyngeal swab, presence of viral mutation(s) within the areas targeted by this assay, and inadequate number of viral copies (<250 copies / mL). A negative result must be combined with clinical observations, patient history, and epidemiological information.  Fact Sheet for Patients:   BoilerBrush.com.cy  Fact Sheet for Healthcare Providers: https://pope.com/  This test is not yet approved or  cleared by the [place] FDA and has been authorized for detection and/or diagnosis of SARS-CoV-2 by FDA under an Emergency Use Authorization (EUA).  This EUA will remain in effect (meaning this test can be used) for the duration of the COVID-19 declaration under Section 564(b)(1) of the Act, 21 U.S.C. section 360bbb-3(b)(1), unless the authorization is terminated or revoked sooner.  Performed at [facility], [address] [place], [place] [address]     Procedures and diagnostic studies:  DG Chest 2 View  Result Date: 11/19/2020 CLINICAL DATA:  Shortness of breath. EXAM: CHEST - 2 VIEW COMPARISON:  07/24/2020 FINDINGS: Symmetric interstitial and airspace opacity with basilar predilection. Normal heart size for portable technique. No definite effusion. No air leak. IMPRESSION: Symmetric pulmonary opacity favoring CHF. Electronically Signed   By: Marnee Spring M.D.    On: 11/19/2020 04:39               LOS: 1 day   Mayumi Summerson  [facility]   Pager on [email or web address]. If 7PM-7AM, please contact night-coverage at [email or web address]     11/20/2020, 4:14 PM

## 2020-11-21 DIAGNOSIS — I5033 Acute on chronic diastolic (congestive) heart failure: Secondary | ICD-10-CM

## 2020-11-21 LAB — BASIC METABOLIC PANEL
Anion gap: 7 (ref 5–15)
BUN: 24 mg/dL — ABNORMAL HIGH (ref 8–23)
CO2: 29 mmol/L (ref 22–32)
Calcium: 9.1 mg/dL (ref 8.9–10.3)
Chloride: 101 mmol/L (ref 98–111)
Creatinine, Ser: 0.76 mg/dL (ref 0.44–1.00)
GFR, Estimated: >60 mL/min (ref >60)
Glucose, Bld: 161 mg/dL — ABNORMAL HIGH (ref 70–99)
Potassium: 3.8 mmol/L (ref 3.5–5.1)
Sodium: 137 mmol/L (ref 135–145)

## 2020-11-21 LAB — GLUCOSE, CAPILLARY: Glucose-Capillary: 172 mg/dL — ABNORMAL HIGH (ref 70–99)

## 2020-11-21 MED ORDER — FUROSEMIDE 20 MG PO TABS: 20.0000 mg | ORAL_TABLET | Freq: Every day | ORAL | 0 refills | Status: DC

## 2020-11-21 MED ORDER — INSULIN DETEMIR 100 UNIT/ML ~~LOC~~ SOLN
12.0000 [IU] | Freq: Every day | SUBCUTANEOUS | Status: DC
  Administered 2020-11-21: 12 [IU] via SUBCUTANEOUS
  Filled 2020-11-21: qty 0.12

## 2020-11-21 NOTE — Consult Note (Signed)
   Heart Failure Nurse Navigator Note  HFpEF 60 to 65%.  Normal left ventricular diastolic parameters.  Right ventricular systolic function is normal.  Left atrium is mildly dilated.  Mild to moderate aortic valve sclerosis, no evidence of aortic stenosis   Presented to the emergency room by way of EMS when she woke up feeling short of breath and could not breathe.  Was found to have O2 saturations on room air of 76%.  Morbidities:  Paroxysmal atrial fibrillation Essential hypertension Type 2 diabetes   Medications:  Apixaban 5 mg 2 times a day  Atorvastatin 40 mg daily Lasix 40 mg daily , to be discharged on 20 mg daily Losartan 50 mg daily Metoprolol tartrate 75 mg 2 times a day  Labs:  Sodium 137, potassium 3.8, chloride 101, CO2 29, BUN 24, creatinine 0.76 up from 0.65 of yesterday.  Intake 360 mL Output 1700 mL Weight performed today BMI 32.7 Blood pressure 166/82  Assessment:   General she is awake and alert walking about her room, she denies any increasing shortness of breath or dizziness or lightheadedness.   HEENT-pupils are equal and reactive to light, no JVD.  Cardiac-heart tones are regular rate and rhythm.  Chest-breath sounds are clear to posterior auscultation  Abdomen-soft nontender.  Musculoskeletal-there is no lower extremity edema.  Psych-is pleasant and appropriate.  Makes good eye contact.  Neurologic-moves all extremities without difficulty.  Speech is clear.    Initial meeting with patient.  Discussed home care includes weight daily and recording.  Report to the heart failure clinic or her physician a weight gain of 2 to 3 pounds overnight or 5 pounds within a week.  So discussed recording.  Discussed taking her blood pressure on a daily basis, resting 10 to 15 minutes before checking it and then to record the reading along with her pulse rate.  Also discussed the importance of removing salt from her diet and sticking with a low  sodium 2000 mg diet daily.  She has used Mrs. Dash in the past.  She states that she has gotten rid of her canned goods and given those to [employer].  He knows that she should be eating more fresh fruits and vegetables as she had done this in the past and she is willing to do it again.  Discussed reading labels.  Also discussed if she wanted an occasional slice of pizza how to work that in to her diet.  So discussed fluid restriction and to drink when she is just thirsty.  She voices understanding.   Discussed her appointment with Clarisa Kindred in the outpatient heart failure clinic, she is scheduled for February 18 at 2 PM.  Reminded to take her daily weight records along with her blood pressure and pulse rate recordings with her.  Also instructed to take her medication bottles with her to the appointment.  She had no further questions, she is being discharged charged home today.   Tresa Endo RN [facility]

## 2020-11-21 NOTE — Discharge Summary (Addendum)
Physician Discharge Summary  Karen Murphy MVH:[id number] DOB: 31-May-1951 DOA: 11/19/2020  PCP: Garnette Czech, MD  Admit date: 11/19/2020 Discharge date: 11/21/2020  Discharge disposition: Home   Recommendations for Outpatient Follow-Up:   Follow up with PCP in 1 week   Discharge Diagnosis:   Principal Problem:   Acute on chronic diastolic CHF (congestive heart failure) (HCC) Active Problems:   Acute respiratory failure (HCC)   Type 2 diabetes mellitus without complication, with long-term current use of insulin (HCC)   Essential hypertension   Flash pulmonary edema (HCC)   AF (paroxysmal atrial fibrillation) (HCC)    Discharge Condition: Stable.  Diet recommendation:  Diet Order            Diet - low sodium heart healthy           Diet Carb Modified           Diet Carb Modified Fluid consistency: Thin; Room service appropriate? Yes  Diet effective now                   Code Status: Full Code     Hospital Course:    [name] is a 70 y.o. female with medical history significant for paroxysmal atrial fibrillation, hypertension, diabetes mellitus, hidradenitis on long-term cephalexin, who presented to the hospital with shortness of breath, orthopnea and low oxygen saturation.  There was no saturation was 76% when EMS arrived.  She was admitted to the hospital for acute exacerbation of chronic diastolic CHF and acute hypoxic respiratory failure.  She was treated with IV Lasix and required up to 4 L/min oxygen via nasal cannula.  Her condition improved and she was successfully weaned off of oxygen.  She was able to ambulate in the hallway and her oxygen saturation with ambulation was 94% and oxygen saturation was 98% at rest.  She is deemed stable for discharge to home today.  She has been advised to avoid NSAIDs since these drugs may exacerbate CHF.      Discharge Exam:    Vitals:   11/20/20 1946 11/20/20 2325 11/21/20 0355 11/21/20 0928  BP: (!)  154/74 (!) 153/70 (!) 153/65 (!) 166/82  Pulse: 72 66 68 67  Resp: Temp: 98.8 F (37.1 C) 98.9 F (37.2 C) 98.8 F (37.1 C)   TempSrc: Oral Oral Oral   SpO2: 98% 98% 97% 97%  Weight:      Height:         GEN: NAD SKIN: Warm and dry EYES: No pallor or icterus ENT: MMM CV: RRR PULM: CTA B ABD: soft, ND, NT, +BS CNS: AAO x 3, non focal EXT: No edema or tenderness   The results of significant diagnostics from this hospitalization (including imaging, microbiology, ancillary and laboratory) are listed below for reference.     Procedures and Diagnostic Studies:   DG Chest 2 View  Result Date: 11/19/2020 CLINICAL DATA:  Shortness of breath. EXAM: CHEST - 2 VIEW COMPARISON:  07/24/2020 FINDINGS: Symmetric interstitial and airspace opacity with basilar predilection. Normal heart size for portable technique. No definite effusion. No air leak. IMPRESSION: Symmetric pulmonary opacity favoring CHF. Electronically Signed   By: Marnee Spring M.D.   On: 11/19/2020 04:39     Labs:   Basic Metabolic Panel: Recent Labs  Lab 11/19/20 0359 11/20/20 0408 11/21/20 0347  NA 139 134* 137  K 3.8 3.7 3.8  CL 101 97* 101  CO2 GLUCOSE  193* 247* 161*  BUN 24* 21 24*  CREATININE 0.88 0.65 0.76  CALCIUM 9.2 9.5 9.1   GFR Estimated Creatinine Clearance: 74.8 mL/min (by C-G formula based on SCr of 0.76 mg/dL). Liver Function Tests: Recent Labs  Lab 11/19/20 0359  AST 26  ALT 22  ALKPHOS 115  BILITOT 0.6  PROT 7.9  ALBUMIN 3.5   Recent Labs  Lab 11/19/20 0359  LIPASE 31   No results for input(s): AMMONIA in the last 168 hours. Coagulation profile Recent Labs  Lab 11/19/20 0359  INR 1.2    CBC: Recent Labs  Lab 11/19/20 0359  WBC 9.2  NEUTROABS 6.3  HGB 10.4*  HCT 31.3*  MCV 89.2  PLT 271   Cardiac Enzymes: No results for input(s): CKTOTAL, CKMB, CKMBINDEX, TROPONINI in the last 168 hours. BNP: Invalid input(s): POCBNP CBG: Recent Labs   Lab 11/20/20 0850 11/20/20 1125 11/20/20 1624 11/20/20 2049 11/21/20 0821  GLUCAP 211* 335* 217* 219* 172*   D-Dimer No results for input(s): DDIMER in the last 72 hours. Hgb A1c Recent Labs    11/19/20 0920  HGBA1C 6.9*   Lipid Profile No results for input(s): CHOL, HDL, LDLCALC, TRIG, CHOLHDL, LDLDIRECT in the last 72 hours. Thyroid function studies No results for input(s): TSH, T4TOTAL, T3FREE, THYROIDAB in the last 72 hours.  Invalid input(s): FREET3 Anemia work up No results for input(s): VITAMINB12, FOLATE, FERRITIN, TIBC, IRON, RETICCTPCT in the last 72 hours. Microbiology Recent Results (from the past 240 hour(s))  SARS Coronavirus 2 by RT PCR (hospital order, performed in [facility] hospital lab) Nasopharyngeal Nasopharyngeal Swab     Status: None   Collection Time: 11/19/20  3:59 AM   Specimen: Nasopharyngeal Swab  Result Value Ref Range Status   SARS Coronavirus 2 NEGATIVE NEGATIVE Final    Comment: (NOTE) SARS-CoV-2 target nucleic acids are NOT DETECTED.  The SARS-CoV-2 RNA is generally detectable in upper and lower respiratory specimens during the acute phase of infection. The lowest concentration of SARS-CoV-2 viral copies this assay can detect is 250 copies / mL. A negative result does not preclude SARS-CoV-2 infection and should not be used as the sole basis for treatment or other patient management decisions.  A negative result may occur with improper specimen collection / handling, submission of specimen other than nasopharyngeal swab, presence of viral mutation(s) within the areas targeted by this assay, and inadequate number of viral copies (<250 copies / mL). A negative result must be combined with clinical observations, patient history, and epidemiological information.  Fact Sheet for Patients:   BoilerBrush.com.cy  Fact Sheet for Healthcare Providers: https://pope.com/  This test is not yet  approved or  cleared by the [place] FDA and has been authorized for detection and/or diagnosis of SARS-CoV-2 by FDA under an Emergency Use Authorization (EUA).  This EUA will remain in effect (meaning this test can be used) for the duration of the COVID-19 declaration under Section 564(b)(1) of the Act, 21 U.S.C. section 360bbb-3(b)(1), unless the authorization is terminated or revoked sooner.  Performed at [facility], [address], [place], [place] [address]      Discharge Instructions:   Discharge Instructions    AMB referral to CHF clinic   Complete by: As directed    Diet - low sodium heart healthy   Complete by: As directed    Diet Carb Modified   Complete by: As directed    Increase activity slowly   Complete by: As directed  Allergies as of 11/21/2020      Reactions   Diltiazem Rash      Medication List    STOP taking these medications   naproxen sodium 220 MG tablet Commonly known as: ALEVE     TAKE these medications   acetaminophen 325 MG tablet Commonly known as: TYLENOL Take 650 mg by mouth every 6 (six) hours as needed for mild pain or moderate pain.   apixaban 5 MG Tabs tablet Commonly known as: ELIQUIS Take 1 tablet (5 mg total) by mouth 2 (two) times daily.   ascorbic acid 500 MG tablet Commonly known as: VITAMIN C Take 500 mg by mouth daily.   atorvastatin 40 MG tablet Commonly known as: LIPITOR Take 1 tablet (40 mg total) by mouth every evening.   Calcium Carbonate-Vitamin D3 600-400 MG-UNIT Tabs Take 1 tablet by mouth daily.   cephALEXin 500 MG capsule Commonly known as: KEFLEX Take 500 mg by mouth 2 (two) times daily.   furosemide 20 MG tablet Commonly known as: Lasix Take 1 tablet (20 mg total) by mouth daily.   glipiZIDE 10 MG 24 hr tablet Commonly known as: GLUCOTROL XL Take 10 mg by mouth daily.   losartan 100 MG tablet Commonly known as: COZAAR Take 0.5 tablets (50 mg total) by mouth daily.    magnesium oxide 400 MG tablet Commonly known as: MAG-OX Take 400 mg by mouth 2 (two) times daily.   metFORMIN 500 MG 24 hr tablet Commonly known as: GLUCOPHAGE-XR Take 1,500 mg by mouth daily with supper.   metoprolol tartrate 50 MG tablet Commonly known as: LOPRESSOR Take 1.5 tablets (75 mg total) by mouth 2 (two) times daily.   ondansetron 8 MG tablet Commonly known as: ZOFRAN Take 8 mg by mouth every 8 (eight) hours as needed for nausea or vomiting.   Toujeo SoloStar 300 UNIT/ML Solostar Pen Generic drug: insulin glargine (1 Unit Dial) Inject 14-18 Units into the skin at bedtime.   Trulicity 0.75 MG/0.5ML Sopn Generic drug: Dulaglutide Inject 0.75 mg into the skin every Sunday.   verapamil 120 MG CR tablet Commonly known as: CALAN-SR Take 120 mg by mouth daily in the afternoon.       Follow-up Information    Garnette Czech, MD On 11/25/2020.   Specialty: Family Medicine Why: @ 12:30pm Contact information: [address] [place] [address] [phone]                Time coordinating discharge: 31 minutes  Signed:  Lurene Shadow  [facility] 11/21/2020, 3:19 PM   Pager on [email or web address]. If 7PM-7AM, please contact night-coverage at [email or web address]

## 2020-11-21 NOTE — Progress Notes (Signed)
SATURATION QUALIFICATIONS: (This note is used to comply with regulatory documentation for home oxygen)  Patient Saturations on Room Air at Rest = 98%  Patient Saturations on Room Air while Ambulating =94%     

## 2020-11-21 NOTE — Progress Notes (Signed)
Discharge instructions explained to pt/ verbalized an understanding/ iv and tele removed/ will transport off unit when ride arrives.  

## 2020-11-23 ENCOUNTER — Telehealth: Payer: Self-pay

## 2020-11-23 NOTE — Telephone Encounter (Signed)
Post hospital phone call   Called and spoke with patient,  She was discharged on 2/7.  She feels she is doing well.  No increasing SOB.  Did errands yesterday and did walk, tolerated well..today is a little tired.  She is weighing herself, had to buy a new scale.  Weight this AM was 201. BP 150/84.  When she went home she threw out the salt, purchased some Mrs.Dash.  Got rid of her canned vegetables bought frozen and is reading labels  Went over med list, Lasix was a new medication for her.  She is not taking the Aleve.  She has heart failure appointment 2/18   Tresa Endo RN, [facility]

## 2020-12-01 NOTE — Progress Notes (Signed)
[name], female    DOB: 25-Nov-1950, 70 y.o.   MRN: [id number]  HPI  Karen Murphy is a 70 y/o female with a history of DM, HTN, anemia, atrial fibrillation, previous tobacco use and chronic heart failure.   Echo report from 07/25/20 reviewed and showed an EF of 60-65% along with mild LAE.  Admitted 11/19/20 due to shortness of breath. Initially given IV lasix with transition to oral diuretics. Placed on oxygen at 4L but then able to be weaned completely off. Discharged after 2 days.   She presents today for her initial visit with a chief complaint of moderate fatigue upon minimal exertion. She describes this as chronic in nature having been present for several months. She has associated shortness of breath, palpitations and light-headedness along with this. She denies any difficulty sleeping, abdominal distention, pedal edema, chest pain, cough or weight gain.   Was able to walk to the [facility] from [facility]. Was a little short of breath when she arrived but recovered quickly after sitting for a few minutes.   Past Medical History:  Diagnosis Date  . Anemia   . Arrhythmia    atrial fibrillation  . Arthritis   . CHF (congestive heart failure) (HCC)   . Diabetes mellitus without complication (HCC)   . Hypertension    History reviewed. No pertinent surgical history. Family History  Problem Relation Age of Onset  . Hypertension Mother   . Hypertension Father    Social History   Tobacco Use  . Smoking status: Former Games developer  . Smokeless tobacco: Never Used  Substance Use Topics  . Alcohol use: Not on file   Allergies  Allergen Reactions  . Diltiazem Rash   Prior to Admission medications   Medication Sig Start Date End Date Taking? Authorizing Provider  acetaminophen (TYLENOL) 325 MG tablet Take 650 mg by mouth every 6 (six) hours as needed for mild pain or moderate pain.   Yes [provider]  apixaban (ELIQUIS) 5 MG TABS tablet Take 1 tablet  (5 mg total) by mouth 2 (two) times daily. 09/14/20  Yes Gollan, Tollie Pizza, MD  ascorbic acid (VITAMIN C) 500 MG tablet Take 500 mg by mouth daily.   Yes [provider]  atorvastatin (LIPITOR) 40 MG tablet Take 1 tablet (40 mg total) by mouth every evening. 09/14/20  Yes Antonieta Iba, MD  Calcium Carbonate-Vitamin D3 600-400 MG-UNIT TABS Take 1 tablet by mouth daily.   Yes [provider]  cephALEXin (KEFLEX) 500 MG capsule Take 500 mg by mouth 2 (two) times daily.   Yes [provider]  furosemide (LASIX) 20 MG tablet Take 1 tablet (20 mg total) by mouth daily. 11/21/20 11/21/21 Yes Lurene Shadow, MD  glipiZIDE (GLUCOTROL XL) 10 MG 24 hr tablet Take 10 mg by mouth daily. 02/03/20  Yes [provider]  losartan (COZAAR) 100 MG tablet Take 0.5 tablets (50 mg total) by mouth daily. 09/14/20  Yes Gollan, Tollie Pizza, MD  magnesium oxide (MAG-OX) 400 MG tablet Take 400 mg by mouth 2 (two) times daily.   Yes [provider]  metFORMIN (GLUCOPHAGE-XR) 500 MG 24 hr tablet Take 1,500 mg by mouth daily with supper. 10/19/19  Yes [provider]  metoprolol tartrate (LOPRESSOR) 50 MG tablet Take 1.5 tablets (75 mg total) by mouth 2 (two) times daily. 09/14/20  Yes Gollan, Tollie Pizza, MD  ondansetron (ZOFRAN) 8 MG tablet Take 8 mg by mouth every 8 (eight)  hours as needed for nausea or vomiting.   Yes [provider]  TOUJEO SOLOSTAR 300 UNIT/ML Solostar Pen Inject 14-18 Units into the skin at bedtime.   Yes [provider]  TRULICITY 0.75 MG/0.5ML SOPN Inject 0.75 mg into the skin every Sunday.   Yes [provider]  verapamil (CALAN-SR) 120 MG CR tablet Take 120 mg by mouth daily in the afternoon.   Yes [provider]    Review of Systems  Constitutional: Positive for fatigue (tire easily). Negative for appetite change.  HENT: Negative for congestion, postnasal drip and sore throat.   Eyes: Negative.   Respiratory:  Positive for shortness of breath (minimal). Negative for cough and chest tightness.   Cardiovascular: Positive for palpitations (at times). Negative for chest pain and leg swelling.  Gastrointestinal: Negative for abdominal distention and abdominal pain.  Endocrine: Negative.   Genitourinary: Negative.   Musculoskeletal: Negative for back pain and neck pain.  Skin: Negative.   Allergic/Immunologic: Negative.   Neurological: Positive for light-headedness (at times). Negative for dizziness.  Hematological: Negative for adenopathy. Does not bruise/bleed easily.  Psychiatric/Behavioral: Negative for dysphoric mood and sleep disturbance (sleeping well on wedge pillow). The patient is not nervous/anxious.     Vitals:   12/02/20 1408  BP: (!) 147/61  Pulse: 76  Resp: 18  SpO2: 99%  Weight: 203 lb 2 oz (92.1 kg)  Height:  (1.676 m)   Wt Readings from Last 3 Encounters:  12/02/20 203 lb 2 oz (92.1 kg)  11/20/20 203 lb (92.1 kg)  09/14/20 205 lb (93 kg)   Lab Results  Component Value Date   CREATININE 0.76 11/21/2020   CREATININE 0.65 11/20/2020   CREATININE 0.88 11/19/2020    Physical Exam Vitals and nursing note reviewed.  Constitutional:      Appearance: Normal appearance.  HENT:     Head: Normocephalic and atraumatic.  Cardiovascular:     Rate and Rhythm: Normal rate and regular rhythm.  Pulmonary:     Effort: Pulmonary effort is normal. No respiratory distress.     Breath sounds: No wheezing or rales.  Abdominal:     General: There is no distension.     Palpations: Abdomen is soft.  Musculoskeletal:        General: No tenderness.     Cervical back: Normal range of motion and neck supple.     Right lower leg: No edema.     Left lower leg: No edema.  Skin:    General: Skin is warm and dry.  Neurological:     General: No focal deficit present.     Mental Status: She is alert and oriented to person, place, and time.  Psychiatric:        Mood and Affect: Mood  normal.        Behavior: Behavior normal.        Thought Content: Thought content normal.    Assessment & Plan:  1: Chronic heart failure with preserved ejection fraction with structural changes (LAE)- - NYHA class III - euvolemic today - weighing daily and home weight chart was reviewed; reminded to call for an overnight weight gain of > 2 pounds or a weekly weight gain of > 5 pounds - not adding salt and has been diligent about reading food labels so that she can follow a low sodium diet; found out that even her alka selter tablets had sodium in them - saw cardiology Mariah Milling) 09/14/20 - discussed possibly changing her  losartan to entresto at future visits - BNP 11/19/20 was 108.8 - discussed paramedicine program and patient is interested so referral was made - reports receiving her flu vaccine - reports receiving all 3 covid vaccines  2: HTN- - BP mildly elevated today but she did walk all the way to our office from [facility] entrance - saw PCP Randa Evens) 11/25/20 - BMP 11/21/20 reviewed and showed sodium 137, potassium 3.8, creatinine 0.76 and GFR >60  3: DM- - A1c 11/19/20 was 6.9% - glucose at home today was 145  4: Paroxysmal Atrial fibrillation- - previously wore Zio monitor November 2021 - occasionally notices palpitations - currently rate controlled on verapamil and metoprolol tartrate   Patient did not bring her medications nor a list. Each medication was verbally reviewed with the patient and she was encouraged to bring the bottles to every visit to confirm accuracy of list.  Return in 2 months or sooner for any questions/problems before then.

## 2020-12-02 ENCOUNTER — Other Ambulatory Visit: Payer: Self-pay

## 2020-12-02 ENCOUNTER — Encounter: Payer: Self-pay | Admitting: Family

## 2020-12-02 ENCOUNTER — Ambulatory Visit: Payer: Medicare Other | Attending: Family | Admitting: Family

## 2020-12-02 VITALS — BP 147/61 | HR 76 | Resp 18 | Ht 66.0 in | Wt 203.1 lb

## 2020-12-02 DIAGNOSIS — I110 Hypertensive heart disease with heart failure: Secondary | ICD-10-CM | POA: Diagnosis not present

## 2020-12-02 DIAGNOSIS — Z7984 Long term (current) use of oral hypoglycemic drugs: Secondary | ICD-10-CM | POA: Insufficient documentation

## 2020-12-02 DIAGNOSIS — E119 Type 2 diabetes mellitus without complications: Secondary | ICD-10-CM | POA: Insufficient documentation

## 2020-12-02 DIAGNOSIS — I10 Essential (primary) hypertension: Secondary | ICD-10-CM

## 2020-12-02 DIAGNOSIS — I5032 Chronic diastolic (congestive) heart failure: Secondary | ICD-10-CM | POA: Diagnosis not present

## 2020-12-02 DIAGNOSIS — Z8249 Family history of ischemic heart disease and other diseases of the circulatory system: Secondary | ICD-10-CM | POA: Insufficient documentation

## 2020-12-02 DIAGNOSIS — Z794 Long term (current) use of insulin: Secondary | ICD-10-CM | POA: Diagnosis not present

## 2020-12-02 DIAGNOSIS — Z7901 Long term (current) use of anticoagulants: Secondary | ICD-10-CM | POA: Diagnosis not present

## 2020-12-02 DIAGNOSIS — Z79899 Other long term (current) drug therapy: Secondary | ICD-10-CM | POA: Insufficient documentation

## 2020-12-02 DIAGNOSIS — I480 Paroxysmal atrial fibrillation: Secondary | ICD-10-CM | POA: Insufficient documentation

## 2020-12-02 DIAGNOSIS — D649 Anemia, unspecified: Secondary | ICD-10-CM | POA: Diagnosis not present

## 2020-12-02 DIAGNOSIS — Z87891 Personal history of nicotine dependence: Secondary | ICD-10-CM | POA: Diagnosis not present

## 2020-12-02 NOTE — Patient Instructions (Signed)
Continue weighing daily and call for an overnight weight gain of > 2 pounds or a weekly weight gain of >5 pounds. 

## 2020-12-08 ENCOUNTER — Other Ambulatory Visit (HOSPITAL_COMMUNITY): Payer: Self-pay

## 2020-12-08 ENCOUNTER — Encounter (HOSPITAL_COMMUNITY): Payer: Self-pay

## 2020-12-08 NOTE — Progress Notes (Signed)
Had a home visit with Gyanna.  She states doing well.  She lives alone, still drives.  I explained the program to her and she wants to be part of it.  We discussed her diet and fluids.  She is watching both and mostly cooks her food with out salt.  She uses mrs dash and no salt.  She measures her fluids.  She is down to 1 cup of coffee in the mornings.  She rarely eats out.  She still gets out and go several places to visit friends.  She has no problems affording her medications and getting everyday things.  She has plenty of food and able to afford rent and utilities.  She has all her medications and has a system in place to take them and to remind her self.  Did ask her if she wants me to fill a med box for her, she states maybe later but now she is good.  She exercise several times a week and wants to feel good.  She is aware of up coming appts.  She denies any problems today such as chest pain, headaches, dizziness or increased shortness of breath.  Lungs are clear.  No edema noted in lower extremities.  Explained if weight gain of 2 lbs or more to contact me or Inetta Fermo with HF clinic.  She is aware of green, yellow and red zones.  Will visit for heart failure, diet and medication management.   Earmon Phoenix [place] EMT-Paramedic [phone]

## 2020-12-15 ENCOUNTER — Other Ambulatory Visit: Payer: Self-pay | Admitting: Family

## 2020-12-15 ENCOUNTER — Telehealth (HOSPITAL_COMMUNITY): Payer: Self-pay

## 2020-12-15 MED ORDER — FUROSEMIDE 20 MG PO TABS
20.0000 mg | ORAL_TABLET | Freq: Every day | ORAL | 3 refills | Status: DC
Start: 2020-12-15 — End: 2021-10-17

## 2020-12-15 NOTE — Progress Notes (Signed)
Refilled furosemide

## 2020-12-15 NOTE — Telephone Encounter (Signed)
Sahirah needing refill on furosemide.  Sent message to [place] with HF clinic to get refills sent to her pharmacy in [facility].   Earmon Phoenix [place] EMT-Paramedic [phone]

## 2020-12-21 ENCOUNTER — Encounter (HOSPITAL_COMMUNITY): Payer: Self-pay

## 2020-12-21 ENCOUNTER — Other Ambulatory Visit (HOSPITAL_COMMUNITY): Payer: Self-pay

## 2020-12-21 NOTE — Progress Notes (Signed)
Today had a home visit with Aarti.  She is doing good.  She states feeling better all the time.  She watches her diet really close.  She is watching her fluids.  She is weighing daily, checking vitals and sugars.  Her sugars has been running good, she has not had any highs or lows, especially at night.  Her weight has went down a couple of lbs in past 2 weeks.  She is very active around the community and she is looking forward getting back to all her activities.  She feels she can do those things again.  She lives alone and still drives.  She is able to make her own decisions.  She cooks and eats mostly at home.  We discussed low and sodium foods.  We discussed old habits and new habits.  She has been working clearing out things at home.  Mood is good.  She is aware of up coming appts.  She has all her medications, she started using the med box that I gave her, she likes it better than her other system.  She is able to fill it and knows how to take her medications and what they are for.  Lungs are clear.  Denies any chest pain, increased shortness of breath, headaches or dizziness.  She states had a feeling that her heart went fast yesterday but did not last.  She states did a lot yesterday.  Regular rate today. She has not missed any meds.  She has no edema in lower extremities and does not feel full in abdomen.  Will continue to visit for heart failure, medication compliance and diet.   Earmon Phoenix [place] EMT-Paramedic [phone]

## 2021-01-05 ENCOUNTER — Telehealth (HOSPITAL_COMMUNITY): Payer: Self-pay

## 2021-01-05 NOTE — Telephone Encounter (Signed)
Was able to contact Karen Murphy today, she states she has been out of her house taking care of a friend that had emergency surgery.  She apologized for missing the appt.  She states she has been doing good.  Advised we can reschedule.  She will call me next week.   Earmon Phoenix [place] EMT-Paramedic [phone]

## 2021-01-31 ENCOUNTER — Ambulatory Visit: Payer: Medicare Other | Admitting: Family

## 2021-01-31 ENCOUNTER — Encounter (HOSPITAL_COMMUNITY): Payer: Self-pay

## 2021-01-31 ENCOUNTER — Other Ambulatory Visit (HOSPITAL_COMMUNITY): Payer: Self-pay

## 2021-01-31 NOTE — Progress Notes (Signed)
Had a home visit with Karen Murphy today.  She states she has been doing well.  Her weight did creep up to 204 lbs but she states she ate lasagna and it has come back down.  She has been taking care of a friend that had surgery past couple of weeks.  She states she is glad to be back home. She weighs daily, she watches high sodium foods and how much fluids she intakes.  She mostly cooks what she eats.  She is wanting to travel and looking forward to going somewhere.  She has all her medications.  She has concerns with her medications prices, her trulicity has went from 45$ to 117$ and eliquis has went from 45$ to 141$ a month.  Will check to see if there is any help for her.  She denies any pain today such as chest pain, she denies any shortness of breath, headaches or dizziness.  She states she has felt good.  She is getting out and doing more things.  She knows when she is tired to lay down and sleep good.  Mood is good.  She is aware of HF clinic appt this week and other appts.  No edema in legs and she does not feel full in abdomen.  Will continue to visit for heart failure.   Earmon Phoenix [place] EMT-Paramedic [phone]

## 2021-02-02 NOTE — Progress Notes (Signed)
[name], female    DOB: [birth year], 70 y.o.   MRN: [id number]  HPI  Karen Murphy is a 70 y/o female with a history of DM, HTN, anemia, atrial fibrillation, previous tobacco use and chronic heart failure.   Echo report from 07/25/20 reviewed and showed an EF of 60-65% along with mild LAE.  Admitted 11/19/20 due to shortness of breath. Initially given IV lasix with transition to oral diuretics. Placed on oxygen at 4L but then able to be weaned completely off. Discharged after 2 days.   She presents today for a follow-up visit with a chief complaint of moderate fatigue upon minimal exertion. She describes this as chronic in nature having been present for several weeks with varying levels of severity. She has associated shortness of breath, intermittent dizziness and upper arm muscle pain along with this. She denies any difficulty sleeping, abdominal distention, palpitations, pedal edema, chest pain, cough or weight gain.   She mentions that her eliquis and trulicity is "quite expensive". She doesn't want to change therapy but is asking if there is any assistance for this. She also says that her PCP decreased her glipizide from BID to QD but she didn't realize it until just in the last week but she's now taking it just once/ day.   Is unsure if the dizziness she has experienced is due to sudden position changes or could possible be related to low glucose.   Past Medical History:  Diagnosis Date  . Anemia   . Arrhythmia    atrial fibrillation  . Arthritis   . CHF (congestive heart failure) (HCC)   . Diabetes mellitus without complication (HCC)   . Hypertension    No past surgical history on file. Family History  Problem Relation Age of Onset  . Hypertension Mother   . Hypertension Father    Social History   Tobacco Use  . Smoking status: Former Games developer  . Smokeless tobacco: Never Used  Substance Use Topics  . Alcohol use: Not on file   Allergies  Allergen Reactions  .  Diltiazem Rash   Prior to Admission medications   Medication Sig Start Date End Date Taking? Authorizing Provider  acetaminophen (TYLENOL) 325 MG tablet Take 650 mg by mouth every 6 (six) hours as needed for mild pain or moderate pain.   Yes [provider]  apixaban (ELIQUIS) 5 MG TABS tablet Take 1 tablet (5 mg total) by mouth 2 (two) times daily. 09/14/20  Yes Gollan, Tollie Pizza, MD  ascorbic acid (VITAMIN C) 500 MG tablet Take 500 mg by mouth daily.   Yes [provider]  atorvastatin (LIPITOR) 40 MG tablet Take 1 tablet (40 mg total) by mouth every evening. 09/14/20  Yes Antonieta Iba, MD  Calcium Carbonate-Vitamin D3 600-400 MG-UNIT TABS Take 1 tablet by mouth daily.   Yes [provider]  cephALEXin (KEFLEX) 500 MG capsule Take 500 mg by mouth 2 (two) times daily.   Yes [provider]  furosemide (LASIX) 20 MG tablet Take 1 tablet (20 mg total) by mouth daily. 12/15/20 12/15/21 Yes Valari Taylor A, FNP  glipiZIDE (GLUCOTROL XL) 10 MG 24 hr tablet Take 10 mg by mouth daily. 02/03/20  Yes [provider]  losartan (COZAAR) 100 MG tablet Take 0.5 tablets (50 mg total) by mouth daily. 09/14/20  Yes Gollan, Tollie Pizza, MD  magnesium oxide (MAG-OX) 400 MG tablet Take 400 mg by mouth 2 (two) times daily.   Yes [provider]  metFORMIN (GLUCOPHAGE-XR) 500 MG 24 hr tablet Take 1,500 mg by mouth daily with supper. 10/19/19  Yes [provider]  metoprolol tartrate (LOPRESSOR) 50 MG tablet Take 1.5 tablets (75 mg total) by mouth 2 (two) times daily. 09/14/20  Yes Gollan, Tollie Pizza, MD  ondansetron (ZOFRAN) 8 MG tablet Take 8 mg by mouth every 8 (eight) hours as needed for nausea or vomiting.   Yes [provider]  TOUJEO SOLOSTAR 300 UNIT/ML Solostar Pen Inject 18 Units into the skin at bedtime.   Yes [provider]  TRULICITY 0.75 MG/0.5ML SOPN Inject 0.75 mg into the skin every Sunday.   Yes [provider]   verapamil (CALAN-SR) 120 MG CR tablet Take 120 mg by mouth daily in the afternoon.   Yes [provider]    Review of Systems  Constitutional: Positive for fatigue (tire easily). Negative for appetite change.  HENT: Negative for congestion, postnasal drip and sore throat.   Eyes: Negative.   Respiratory: Positive for shortness of breath (minimal). Negative for cough and chest tightness.   Cardiovascular: Negative for chest pain, palpitations and leg swelling.  Gastrointestinal: Negative for abdominal distention and abdominal pain.  Endocrine: Negative.   Genitourinary: Negative.   Musculoskeletal: Positive for myalgias (both upper arms at times). Negative for back pain and neck pain.  Skin: Negative.   Allergic/Immunologic: Negative.   Neurological: Positive for dizziness (at times w/ sudden position changes). Negative for light-headedness.  Hematological: Negative for adenopathy. Does not bruise/bleed easily.  Psychiatric/Behavioral: Negative for dysphoric mood and sleep disturbance (sleeping well on wedge pillow). The patient is not nervous/anxious.    Vitals:   02/03/21 0900  BP: (!) 144/73  Pulse: 82  Resp: 18  SpO2: 100%  Weight: 198 lb (89.8 kg)  Height: 5\' 6"  (1.676 m)   Wt Readings from Last 3 Encounters:  02/03/21 198 lb (89.8 kg)  01/31/21 198 lb (89.8 kg)  12/21/20 198 lb (89.8 kg)   Lab Results  Component Value Date   CREATININE 0.76 11/21/2020   CREATININE 0.65 11/20/2020   CREATININE 0.88 11/19/2020    Physical Exam Vitals and nursing note reviewed.  Constitutional:      Appearance: Normal appearance.  HENT:     Head: Normocephalic and atraumatic.  Cardiovascular:     Rate and Rhythm: Normal rate and regular rhythm.  Pulmonary:     Effort: Pulmonary effort is normal. No respiratory distress.     Breath sounds: No wheezing or rales.  Abdominal:     General: There is no distension.     Palpations: Abdomen is soft.  Musculoskeletal:         General: No tenderness.     Cervical back: Normal range of motion and neck supple.     Right lower leg: No edema.     Left lower leg: No edema.  Skin:    General: Skin is warm and dry.  Neurological:     General: No focal deficit present.     Mental Status: She is alert and oriented to person, place, and time.  Psychiatric:        Mood and Affect: Mood normal.        Behavior: Behavior normal.        Thought Content: Thought content normal.    Assessment & Plan:  1: Chronic heart failure with preserved ejection fraction with structural changes (LAE)- - NYHA class III - euvolemic today - weighing daily; reminded to call for  an overnight weight gain of > 2 pounds or a weekly weight gain of > 5 pounds - weight down 5 pounds from last visit here 2 months ago - not adding salt and has been diligent about reading food labels so that she can follow a low sodium diet - saw cardiology Mariah Milling) 09/14/20 - BNP 11/19/20 was 108.8 - participating in paramedicine program  - reports receiving all 3 covid vaccines - PharmD reconciled medications with the patient  2: HTN- - BP mildly elevated today - saw PCP Randa Evens) 11/25/20 - BMP 11/21/20 reviewed and showed sodium 137, potassium 3.8, creatinine 0.76 and GFR >60  3: DM- - A1c 11/19/20 was 6.9% - glucose at home today was 112; now taking glipizide just daily instead of BID  - PCP office was called to ask them to f/u with patient about any patient assistance with trulicity  4: Paroxysmal Atrial fibrillation- - previously wore Zio monitor November 2021 - message sent to cardiology office about patient applying for patient assistance for eliquis; 1 month samples given to her today - currently rate controlled on verapamil and metoprolol tartrate   Patient did not bring her medications nor a list. Each medication was verbally reviewed with the patient and she was encouraged to bring the bottles to every visit to confirm accuracy of list.  Return  in 6 months or sooner for any questions/problems before then.

## 2021-02-03 ENCOUNTER — Other Ambulatory Visit: Payer: Self-pay

## 2021-02-03 ENCOUNTER — Encounter: Payer: Self-pay | Admitting: Family

## 2021-02-03 ENCOUNTER — Telehealth: Payer: Self-pay

## 2021-02-03 ENCOUNTER — Ambulatory Visit: Payer: Medicare Other | Attending: Family | Admitting: Family

## 2021-02-03 VITALS — BP 144/73 | HR 82 | Resp 18 | Ht 66.0 in | Wt 198.0 lb

## 2021-02-03 DIAGNOSIS — E119 Type 2 diabetes mellitus without complications: Secondary | ICD-10-CM | POA: Insufficient documentation

## 2021-02-03 DIAGNOSIS — I5032 Chronic diastolic (congestive) heart failure: Secondary | ICD-10-CM

## 2021-02-03 DIAGNOSIS — I110 Hypertensive heart disease with heart failure: Secondary | ICD-10-CM | POA: Insufficient documentation

## 2021-02-03 DIAGNOSIS — Z79899 Other long term (current) drug therapy: Secondary | ICD-10-CM | POA: Insufficient documentation

## 2021-02-03 DIAGNOSIS — Z794 Long term (current) use of insulin: Secondary | ICD-10-CM | POA: Diagnosis not present

## 2021-02-03 DIAGNOSIS — I480 Paroxysmal atrial fibrillation: Secondary | ICD-10-CM | POA: Diagnosis not present

## 2021-02-03 DIAGNOSIS — I10 Essential (primary) hypertension: Secondary | ICD-10-CM

## 2021-02-03 DIAGNOSIS — Z87891 Personal history of nicotine dependence: Secondary | ICD-10-CM | POA: Insufficient documentation

## 2021-02-03 DIAGNOSIS — Z8249 Family history of ischemic heart disease and other diseases of the circulatory system: Secondary | ICD-10-CM | POA: Insufficient documentation

## 2021-02-03 NOTE — Patient Instructions (Signed)
Continue weighing daily and call for an overnight weight gain of > 2 pounds or a weekly weight gain of >5 pounds. 

## 2021-02-03 NOTE — Telephone Encounter (Signed)
Attempted to reach pt via phone, no answer. LVM advising her to swing by clinic to pick up a PA application for Eliquis or she can print online application. Once complete drop it off back at the clinic for provider to sign and fill out and we will fax application.

## 2021-02-03 NOTE — Progress Notes (Signed)
[facility] - PHARMACIST COUNSELING NOTE  ADHERENCE ASSESSMENT   Do you ever forget to take your medication? [] Yes (1) [x] No (0)  Do you ever skip doses due to side effects? [] Yes (1) [x] No (0)  Do you have trouble affording your medicines? [] Yes (1) [x] No (0)  Are you ever unable to pick up your medication due to transportation difficulties? [] Yes (1) [x] No (0)  Do you ever stop taking your medications because you don't believe they are helping? [] Yes (1) [x] No (0)  Total score _0______     Guideline-Directed Medical Therapy/Evidence Based Medicine  ACE/ARB/ARNI: Losartan 50 mg daily  Beta Blocker: metoprolol tartrate 75 mg twice daily  Aldosterone Antagonist: N/A Diuretic: furosemide 20 mg daily     SUBJECTIVE  HPI:  Past Medical History:  Diagnosis Date  . Anemia   . Arrhythmia    atrial fibrillation  . Arthritis   . CHF (congestive heart failure) ([place])   . Diabetes mellitus without complication ([place])   . Hypertension         OBJECTIVE   Vital signs: HR 82, BP 144/73, weight 89.8 kg ECHO: Date 07/25/20, EF 60-65%  BMP Latest Ref Rng & Units 11/21/2020 11/20/2020 11/19/2020  Glucose 70 - 99 mg/dL 161(H) 247(H) 193(H)  BUN 8 - 23 mg/dL 24(H) 21 24(H)  Creatinine 0.44 - 1.00 mg/dL 0.76 0.65 0.88  Sodium 135 - 145 mmol/L 137 134(L) 139  Potassium 3.5 - 5.1 mmol/L 3.8 3.7 3.8  Chloride 98 - 111 mmol/L 101 97(L) 101  CO2 22 - 32 mmol/L 29 26 29   Calcium 8.9 - 10.3 mg/dL 9.1 9.5 9.2    ASSESSMENT 70 yo F presenting to heart failure clinic for follow-up visit. PMH includes HTN, diabetes, CHF, and arthritis. Pt mentioned Trulicity and Eliquis were each over $100 a month. No additional barriers to adherence were identified.     PLAN CHF/HTN - Continue furosemide 20 mg daily  - Continue losartan 50 mg daily  - Continue metoprolol tartrate 75 mg twice daily - Consider discontinuing verapamil due to negative inotropic  effects in heart failure  Medication Costs - Message was sent to Dr. Rockey Situ regarding pt assistance for Eliquis. Samples can be provided. - Called Dr. Harold Barban office regarding cost of Trulicity   Atrial Fibrillation  - Continue apixaban 5 mg twice daily  - Consider discontinuing verapamil due to negative inotropic effects in heart failure  Diabetes - Continue Trulicity 5.28 mg weekly injections - Continue metformin 1500 mg daily - Continue Toujeo insulin 18 units at bedtime - Continue glipizide 10 mg daily  - Continue atorvastatin 40 mg daily   Hydradentitis - Continue cephalexin 500 mg twice daily (chronic use)   Nausea/vomiting - Continue ondansetron 8 mg every 8 hours as needed  General Health - Continue magnesium oxide 400 mg twice daily  - Continue calcium carbonate-vitamin D3 600-400 mg-unit daily  - Continue ascorbic acid 500 mg daily - Continue acetaminophen 650 mg every 6 hours as needed   Time spent: 10 minutes  Benn Moulder, PharmD Pharmacy Resident  02/03/2021 11:37 AM    Current Outpatient Medications:  .  acetaminophen (TYLENOL) 325 MG tablet, Take 650 mg by mouth every 6 (six) hours as needed for mild pain or moderate pain., Disp: , Rfl:  .  apixaban (ELIQUIS) 5 MG TABS tablet, Take 1 tablet (5 mg total) by mouth 2 (two) times daily., Disp: 180 tablet, Rfl: 3 .  ascorbic acid (VITAMIN  C) 500 MG tablet, Take 500 mg by mouth daily., Disp: , Rfl:  .  atorvastatin (LIPITOR) 40 MG tablet, Take 1 tablet (40 mg total) by mouth every evening., Disp: 90 tablet, Rfl: 3 .  Calcium Carbonate-Vitamin D3 600-400 MG-UNIT TABS, Take 1 tablet by mouth daily., Disp: , Rfl:  .  cephALEXin (KEFLEX) 500 MG capsule, Take 500 mg by mouth 2 (two) times daily., Disp: , Rfl:  .  furosemide (LASIX) 20 MG tablet, Take 1 tablet (20 mg total) by mouth daily., Disp: 90 tablet, Rfl: 3 .  glipiZIDE (GLUCOTROL XL) 10 MG 24 hr tablet, Take 10 mg by mouth daily., Disp: , Rfl:  .   losartan (COZAAR) 100 MG tablet, Take 0.5 tablets (50 mg total) by mouth daily., Disp: 45 tablet, Rfl: 3 .  magnesium oxide (MAG-OX) 400 MG tablet, Take 400 mg by mouth 2 (two) times daily., Disp: , Rfl:  .  metFORMIN (GLUCOPHAGE-XR) 500 MG 24 hr tablet, Take 1,500 mg by mouth daily with supper., Disp: , Rfl:  .  metoprolol tartrate (LOPRESSOR) 50 MG tablet, Take 1.5 tablets (75 mg total) by mouth 2 (two) times daily., Disp: 270 tablet, Rfl: 3 .  ondansetron (ZOFRAN) 8 MG tablet, Take 8 mg by mouth every 8 (eight) hours as needed for nausea or vomiting., Disp: , Rfl:  .  TOUJEO SOLOSTAR 300 UNIT/ML Solostar Pen, Inject 18 Units into the skin at bedtime., Disp: , Rfl:  .  TRULICITY 7.16 RC/7.8LF SOPN, Inject 0.75 mg into the skin every Sunday., Disp: , Rfl:  .  verapamil (CALAN-SR) 120 MG CR tablet, Take 120 mg by mouth daily in the afternoon., Disp: , Rfl:    COUNSELING POINTS/CLINICAL PEARLS  Metoprolol Succinate (Goal: 200 mg once daily) Warn patient to avoid activities requiring mental alertness or coordination until drug effects are realized, as drug may cause dizziness. Tell patient planning major surgery with anesthesia to alert physician that drug is being used, as drug impairs ability of heart to respond to reflex adrenergic stimuli. Drug may cause diarrhea, fatigue, headache, or depression. Advise diabetic patient to carefully monitor blood glucose as drug may mask symptoms of hypoglycemia. Patient should take extended-release tablet with or immediately following meals. Counsel patient against sudden discontinuation of drug, as this may precipitate hypertension, angina, or myocardial infarction. In the event of a missed dose, counsel patient to skip the missed dose and maintain a regular dosing schedule. Losartan (Goal: 150 mg once daily)  Warn female patient to avoid pregnancy and to report a pregnancy that occurs during therapy.  Side effects may include dizziness, upper respiratory  infection, nasal congestion, and back pain.  Warn patient to avoid use of potassium supplements or potassium-containing salt substitutes unless they consult healthcare provider. Furosemide  Drug causes sun-sensitivity. Advise patient to use sunscreen and avoid tanning beds. Patient should avoid activities requiring coordination until drug effects are realized, as drug may cause dizziness, vertigo, or blurred vision. This drug may cause hyperglycemia, hyperuricemia, constipation, diarrhea, loss of appetite, nausea, vomiting, purpuric disorder, cramps, spasticity, asthenia, headache, paresthesia, or scaling eczema. Instruct patient to report unusual bleeding/bruising or signs/symptoms of hypotension, infection, pancreatitis, or ototoxicity (tinnitus, hearing impairment). Advise patient to report signs/symptoms of a severe skin reactions (flu-like symptoms, spreading red rash, or skin/mucous membrane blistering) or erythema multiforme. Instruct patient to eat high-potassium foods during drug therapy, as directed by healthcare professional.  Patient should not drink alcohol while taking this drug.  DRUGS TO AVOID IN HEART FAILURE  Drug or Class Mechanism  Analgesics . NSAIDs . COX-2 inhibitors . Glucocorticoids  Sodium and water retention, increased systemic vascular resistance, decreased response to diuretics   Diabetes Medications . Metformin . Thiazolidinediones o Rosiglitazone (Avandia) o Pioglitazone (Actos) . DPP4 Inhibitors o Saxagliptin (Onglyza) o Sitagliptin (Januvia)   Lactic acidosis Possible calcium channel blockade   Unknown  Antiarrhythmics . Class I  o Flecainide o Disopyramide . Class III o Sotalol . Other o Dronedarone  Negative inotrope, proarrhythmic   Proarrhythmic, beta blockade  Negative inotrope  Antihypertensives . Alpha Blockers o Doxazosin . Calcium Channel Blockers o Diltiazem o Verapamil o Nifedipine . Central Alpha  Adrenergics o Moxonidine . Peripheral Vasodilators o Minoxidil  Increases renin and aldosterone  Negative inotrope    Possible sympathetic withdrawal  Unknown  Anti-infective . Itraconazole . Amphotericin B  Negative inotrope Unknown  Hematologic . Anagrelide . Cilostazol   Possible inhibition of PD IV Inhibition of PD III causing arrhythmias  Neurologic/Psychiatric . Stimulants . Anti-Seizure Drugs o Carbamazepine o Pregabalin . Antidepressants o Tricyclics o Citalopram . Parkinsons o Bromocriptine o Pergolide o Pramipexole . Antipsychotics o Clozapine . Antimigraine o Ergotamine o Methysergide . Appetite suppressants . Bipolar o Lithium  Peripheral alpha and beta agonist activity  Negative inotrope and chronotrope Calcium channel blockade  Negative inotrope, proarrhythmic Dose-dependent QT prolongation  Excessive serotonin activity/valvular damage Excessive serotonin activity/valvular damage Unknown  IgE mediated hypersensitivy, calcium channel blockade  Excessive serotonin activity/valvular damage Excessive serotonin activity/valvular damage Valvular damage  Direct myofibrillar degeneration, adrenergic stimulation  Antimalarials . Chloroquine . Hydroxychloroquine Intracellular inhibition of lysosomal enzymes  Urologic Agents . Alpha Blockers o Doxazosin o Prazosin o Tamsulosin o Terazosin  Increased renin and aldosterone  Adapted from Page RL, et al. "Drugs That May Cause or Exacerbate Heart Failure: A Scientific Statement from the [place]." Circulation 2016; 638:G66-Z99. DOI: 10.1161/CIR.0000000000000426   MEDICATION ADHERENCES TIPS AND STRATEGIES 1. Taking medication as prescribed improves patient outcomes in heart failure (reduces hospitalizations, improves symptoms, increases survival) 2. Side effects of medications can be managed by decreasing doses, switching agents, stopping drugs, or adding additional  therapy. Please let someone in the [facility] know if you have having bothersome side effects so we can modify your regimen. Do not alter your medication regimen without talking to [place].  3. Medication reminders can help patients remember to take drugs on time. If you are missing or forgetting doses you can try linking behaviors, using pill boxes, or an electronic reminder like an alarm on your phone or an app. Some people can also get automated phone calls as medication reminders.

## 2021-02-23 ENCOUNTER — Encounter (HOSPITAL_COMMUNITY): Payer: Self-pay

## 2021-02-23 ENCOUNTER — Other Ambulatory Visit (HOSPITAL_COMMUNITY): Payer: Self-pay

## 2021-02-23 NOTE — Progress Notes (Signed)
Today had a home visit with Jomarie. She states been doing well, she stays with her friend some and stays at her place.  He lives in [place].  She also taking care of her sister some.  She denies any problems such as chest pain, headaches or dizziness.  She has no increased shortness of breath.  She states her sugars has been good.  She states highest weight was 201 lbs but comes back down.  She watches her diet and fluids she intakes.  She is aware of appts.  She has all her medications and she places them in a med box.  Gave her information to see if she can get asistance with trulicity, she states its 200 dollars now, keeps going up.  Inetta Fermo gave her samples of eliquis to help out.  Advised her to let me know what she finds out and I can reach out further for her.  Mood is good.  Lungs are clear, she has no edema in legs.  Abdomen is soft.  Will continue to visit for heart failure, diet and medication management.   Earmon Phoenix [place] EMT-Paramedic [phone]

## 2021-03-15 NOTE — Progress Notes (Signed)
Cardiology Office Note  Date:  03/16/2021   ID:  Karen Murphy, DOB 1951/05/24, MRN [id number]  PCP:  Garnette Czech, MD   Chief Complaint  Patient presents with  . 6 month follow up     Patient c/o shortness of breath with little exertion. Medications reviewed by the patient verbally.     HPI:  Karen Murphy is a 70 y.o. female with a hx of  HTN,  PAF on Eliquis, syncope/orthostasis in the setting of arrhythmia,  HLD, DM 2, previous tobacco use, previous pneumonia, rheumatoid arthritis  Presenting for follow-up of her atrial fibrillation  LOV 09/2020 eliquis expensive, >300$ Has 1 week remaining in samples, then will get into trouble  Walked in heat yesterday, shopping Got tired, had to go home  Did not take metoprolol today, going out to breakfast  Little bit of ankle swelling Takes lasix 20 daily  EKG personally reviewed by myself on todays visit Atrial fib rate 79 bpm  Records reviewed In the hospital October 2021   paroxysmal atrial fibrillation with RVR and converted to NSR on diltiazem drip.  Eliquis 5 mg twice daily.   syncope  in the setting of standing up during palpitations and likely due to her atrial for with RVR and orthostatic hypotension.     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  Her diltiazem was stopped and she was started on verapamil.  Recent monitor showing paroxysmal atrial fibrillation 1% burden Longest episode 2 hours 38 minutes heart rate 121  Maybe tired on verapamil, better now, Nervous about increasing dose at this time, still running 120 daily  Sedentary baseline, legs weak, presenting in a wheelchair Blood pressure heart rates reviewed from home, frequent blood pressure measurements 140 sometimes up to  150 systolic, rare 130 Previously on losartan 100, currently taking 50   PMH:   has a past medical history of Anemia, Arrhythmia, Arthritis, CHF (congestive heart failure) (HCC), Diabetes mellitus without complication (HCC), and Hypertension.  PSH:   History reviewed. No pertinent surgical history.  Current Outpatient Medications  Medication Sig Dispense Refill  . acetaminophen (TYLENOL) 325 MG tablet Take 650 mg by mouth every 6 (six) hours as needed for mild pain or moderate pain.    Marland Kitchen apixaban (ELIQUIS) 5 MG TABS tablet Take 1 tablet (5 mg total) by mouth 2 (two) times daily. 180 tablet 3  . ascorbic acid (VITAMIN C) 500 MG tablet Take 500 mg by mouth daily.    Marland Kitchen atorvastatin (LIPITOR) 40 MG tablet Take 1 tablet (40 mg total) by mouth every evening. 90 tablet 3  . Calcium Carbonate-Vitamin D3 600-400 MG-UNIT TABS Take 1 tablet by mouth daily.    . cephALEXin (KEFLEX) 500 MG capsule Take 500 mg by mouth 2 (two) times daily.    . furosemide (LASIX) 20 MG tablet Take 1 tablet (20 mg total) by mouth daily. 90 tablet 3  . glipiZIDE (GLUCOTROL XL) 10 MG 24 hr tablet Take 10 mg by mouth daily.    Marland Kitchen losartan (COZAAR) 100 MG tablet Take 0.5 tablets (50 mg total) by mouth daily. 45 tablet 3  . magnesium oxide (MAG-OX) 400 MG tablet Take 400 mg by mouth 2 (two) times daily.    . metFORMIN (GLUCOPHAGE-XR) 500 MG 24 hr tablet Take 1,500 mg  by mouth daily with supper.    . metoprolol tartrate (LOPRESSOR) 50 MG tablet Take 1.5 tablets (75 mg total) by mouth 2 (two) times daily. 270 tablet 3  . ondansetron (ZOFRAN) 8 MG tablet Take 8 mg by mouth every 8 (eight) hours as needed for nausea or vomiting.    Marland Kitchen TOUJEO SOLOSTAR 300 UNIT/ML Solostar Pen Inject 18 Units into the skin at bedtime.    . TRULICITY 0.75 MG/0.5ML SOPN Inject 0.75 mg into the skin every Sunday.    . verapamil (CALAN-SR) 120 MG CR tablet Take 120 mg by mouth daily in the afternoon.     No current facility-administered medications for  this visit.     Allergies:   Diltiazem   Social History:  The patient  reports that she has quit smoking. She has never used smokeless tobacco.   Family History:   family history includes Hypertension in her father and mother.    Review of Systems: Review of Systems  Constitutional: Negative.   HENT: Negative.   Respiratory: Negative.   Cardiovascular: Negative.   Gastrointestinal: Negative.   Musculoskeletal: Negative.   Neurological: Negative.   Psychiatric/Behavioral: Negative.   All other systems reviewed and are negative.   PHYSICAL EXAM: VS:  BP 140/82 (BP Location: Left Arm, Patient Position: Sitting, Cuff Size: Normal)   Pulse 79   Ht 5\' 4"  (1.626 m)   Wt 200 lb 4 oz (90.8 kg)   SpO2 98%   BMI 34.37 kg/m  , BMI Body mass index is 34.37 kg/m. Constitutional:  oriented to person, place, and time. No distress.  HENT:  Head: Grossly normal Eyes:  no discharge. No scleral icterus.  Neck: No JVD, no carotid bruits  Cardiovascular: IRRR no murmurs appreciated Pulmonary/Chest: Clear to auscultation bilaterally, no wheezes or rails Abdominal: Soft.  no distension.  no tenderness.  Musculoskeletal: Normal range of motion Neurological:  normal muscle tone. Coordination normal. No atrophy Skin: Skin warm and dry Psychiatric: normal affect, pleasant   Recent Labs: 07/24/2020: Magnesium 1.7; TSH 0.746 11/19/2020: ALT 22; B Natriuretic Peptide 108.8; Hemoglobin 10.4; Platelets 271 11/21/2020: BUN 24; Creatinine, Ser 0.76; Potassium 3.8; Sodium 137    Lipid Panel No results found for: CHOL, HDL, LDLCALC, TRIG    Wt Readings from Last 3 Encounters:  03/16/21 200 lb 4 oz (90.8 kg)  02/23/21 199 lb (90.3 kg)  02/03/21 198 lb (89.8 kg)      ASSESSMENT AND PLAN:  Problem List Items Addressed This Visit      Cardiology Problems   Essential hypertension     Other   Type 2 diabetes mellitus without complication, with long-term current use of insulin (HCC)     Other Visit Diagnoses    Chronic diastolic heart failure (HCC)    -  Primary   Relevant Orders   EKG 12-Lead   Paroxysmal atrial fibrillation (HCC)       Relevant Orders   EKG 12-Lead   Mixed hyperlipidemia         Atrial fibrillation with RVR In atrial fibrillation on today's visit  PAF on monitor, 1%, relatively asymptomatic continue Eliquis, verapamil  metoprolol  75 mg twice a day, did not take today She is atrial fibrillation today but no symptoms, will continue to monitor Recommend she call 02/05/21 for any ankle swelling or abdominal distention, continue Lasix daily  Diabetes type 2 Hemoglobin A1c 7.5 We have encouraged continued exercise, careful diet management in an effort to lose weight. Recommended walking  program  Essential hypertension Continue current medication regiment  Debility On prior clinic visit presented in wheelchair, walking today Recommend she continue walking program, work with PT   Total encounter time more than 25 minutes  Greater than 50% was spent in counseling and coordination of care with the patient    Signed, Dossie Arbour, M.D., Ph.D. [facility] [place], [place] [phone]

## 2021-03-16 ENCOUNTER — Other Ambulatory Visit: Payer: Self-pay

## 2021-03-16 ENCOUNTER — Encounter: Payer: Self-pay | Admitting: Cardiovascular Disease

## 2021-03-16 ENCOUNTER — Ambulatory Visit (INDEPENDENT_AMBULATORY_CARE_PROVIDER_SITE_OTHER): Payer: Medicare Other | Admitting: Cardiovascular Disease

## 2021-03-16 VITALS — BP 140/82 | HR 79 | Ht 64.0 in | Wt 200.2 lb

## 2021-03-16 DIAGNOSIS — E119 Type 2 diabetes mellitus without complications: Secondary | ICD-10-CM | POA: Diagnosis not present

## 2021-03-16 DIAGNOSIS — I480 Paroxysmal atrial fibrillation: Secondary | ICD-10-CM

## 2021-03-16 DIAGNOSIS — I5032 Chronic diastolic (congestive) heart failure: Secondary | ICD-10-CM

## 2021-03-16 DIAGNOSIS — I10 Essential (primary) hypertension: Secondary | ICD-10-CM | POA: Diagnosis not present

## 2021-03-16 DIAGNOSIS — Z794 Long term (current) use of insulin: Secondary | ICD-10-CM

## 2021-03-16 DIAGNOSIS — E782 Mixed hyperlipidemia: Secondary | ICD-10-CM

## 2021-03-16 NOTE — Patient Instructions (Addendum)
Medication Instructions:  No changes  Eliquis samples given (4 boxes, 1 month supply)  Lot: [id number]  Exp: 02/2023  Patient assistance application given, please complete and bring back or mail application with required documents back to our office  If you need a refill on your cardiac medications before your next appointment, please call your pharmacy.   Lab work: No new labs needed  Testing/Procedures: No new testing needed   Follow-Up: At [facility], you and your health needs are our priority.  As part of our continuing mission to provide you with exceptional heart care, we have created designated Provider Care Teams.  These Care Teams include your primary Cardiologist (physician) and Advanced Practice Providers (APPs -  Physician Assistants and Nurse Practitioners) who all work together to provide you with the care you need, when you need it.  . You will need a follow up appointment in 6 months, APP ok  . Providers on your designated Care Team:   . Nicolasa Ducking, NP . Eula Listen, PA-C . Marisue Ivan, PA-C   COVID-19 Vaccine Information can be found at: [email or web address] For questions related to vaccine distribution or appointments, please email [email or web address] or call [phone].

## 2021-03-21 ENCOUNTER — Telehealth (HOSPITAL_COMMUNITY): Payer: Self-pay

## 2021-03-21 NOTE — Telephone Encounter (Signed)
Attempted to contact to set up home visit.  No answer/left message.   Traylon Schimming [place] EMT-Paramedic [phone] 

## 2021-05-08 ENCOUNTER — Telehealth (HOSPITAL_COMMUNITY): Payer: Self-pay

## 2021-05-08 NOTE — Telephone Encounter (Signed)
Weight 193 lbs.  Spoke with Karen Murphy today and she is doing well.  She is staying very active, she is staying in [place] a lot taking care of a friend.  She is weighing daily and watching high sodium foods and fluids.   She states she is starting to feel better than she did before she got sick.  She denies any problems such as chest pain, headache, dizziness or increased shortness of breath.  She states will be starting to stay home more next month.  Will try to make a home visit next month.  Will continue to visit for heart failure, diet and medication management.  She is aware to call with any problems.   Earmon Phoenix [place] EMT-Paramedic [phone]

## 2021-07-06 ENCOUNTER — Telehealth (HOSPITAL_COMMUNITY): Payer: Self-pay

## 2021-07-06 NOTE — Telephone Encounter (Signed)
Today was able to meet with Toniann on the phone.  She is in town right now.  She states has been doing well.  She states she weighs daily, takes her blood pressure and checks her sugar daily.   She states her weight is staying around 195-199 lbs now.  She has energy and shortness of breath with ambulating is going away.  Will have a home visit next week.  Will visit for heart failure, diet and medication management.   Earmon Phoenix [place] EMT-Paramedic [phone]

## 2021-07-11 ENCOUNTER — Other Ambulatory Visit (HOSPITAL_COMMUNITY): Payer: Self-pay

## 2021-07-11 ENCOUNTER — Encounter (HOSPITAL_COMMUNITY): Payer: Self-pay

## 2021-07-11 NOTE — Progress Notes (Signed)
Today had a home visit with Arleny.  She states been doing well.  She goes back and forth to visit with a friend in [place].  She is active around the community and involved in several committees.  She is a joy to visit with and we talk a lot.  She feels great.  She has all her medications and aware of how to take them.  She placed them in a med box her self.  Brought her a new med box, hers in getting worn.  She is watching high sodium foods and measures her fluids daily.  She is aware of up coming appts.  She has family support close by.  She denies any problems such as chest pain, headache, dizziness or increased shortness of breath.  She is aware to call with weight gain of 3 lbs or more.  Will continue to visit for heart failure, diet and medication management.   Earmon Phoenix [place] EMT-Paramedic [phone]

## 2021-07-18 ENCOUNTER — Emergency Department: Payer: Medicare Other

## 2021-07-18 ENCOUNTER — Other Ambulatory Visit: Payer: Self-pay

## 2021-07-18 ENCOUNTER — Encounter: Payer: Self-pay | Admitting: Radiology

## 2021-07-18 ENCOUNTER — Telehealth (HOSPITAL_COMMUNITY): Payer: Self-pay

## 2021-07-18 ENCOUNTER — Emergency Department
Admission: EM | Admit: 2021-07-18 | Discharge: 2021-07-18 | Disposition: A | Discharge status: Home / Self Care | Payer: Medicare Other | Attending: Emergency Medicine | Admitting: Emergency Medicine

## 2021-07-18 DIAGNOSIS — Z87891 Personal history of nicotine dependence: Secondary | ICD-10-CM | POA: Diagnosis not present

## 2021-07-18 DIAGNOSIS — R0602 Shortness of breath: Secondary | ICD-10-CM

## 2021-07-18 DIAGNOSIS — J90 Pleural effusion, not elsewhere classified: Secondary | ICD-10-CM | POA: Insufficient documentation

## 2021-07-18 DIAGNOSIS — Z7901 Long term (current) use of anticoagulants: Secondary | ICD-10-CM | POA: Diagnosis not present

## 2021-07-18 DIAGNOSIS — E119 Type 2 diabetes mellitus without complications: Secondary | ICD-10-CM | POA: Diagnosis not present

## 2021-07-18 DIAGNOSIS — I10 Essential (primary) hypertension: Secondary | ICD-10-CM | POA: Diagnosis not present

## 2021-07-18 DIAGNOSIS — R635 Abnormal weight gain: Secondary | ICD-10-CM | POA: Diagnosis not present

## 2021-07-18 DIAGNOSIS — Z20822 Contact with and (suspected) exposure to covid-19: Secondary | ICD-10-CM | POA: Insufficient documentation

## 2021-07-18 DIAGNOSIS — Z79899 Other long term (current) drug therapy: Secondary | ICD-10-CM | POA: Diagnosis not present

## 2021-07-18 DIAGNOSIS — J9811 Atelectasis: Secondary | ICD-10-CM | POA: Diagnosis not present

## 2021-07-18 DIAGNOSIS — I5033 Acute on chronic diastolic (congestive) heart failure: Secondary | ICD-10-CM | POA: Insufficient documentation

## 2021-07-18 DIAGNOSIS — Z794 Long term (current) use of insulin: Secondary | ICD-10-CM | POA: Insufficient documentation

## 2021-07-18 DIAGNOSIS — Z7984 Long term (current) use of oral hypoglycemic drugs: Secondary | ICD-10-CM | POA: Insufficient documentation

## 2021-07-18 LAB — BASIC METABOLIC PANEL
Anion gap: 7 (ref 5–15)
BUN: 14 mg/dL (ref 8–23)
CO2: 31 mmol/L (ref 22–32)
Calcium: 9.4 mg/dL (ref 8.9–10.3)
Chloride: 101 mmol/L (ref 98–111)
Creatinine, Ser: 0.87 mg/dL (ref 0.44–1.00)
GFR, Estimated: >60 mL/min (ref >60)
Glucose, Bld: 182 mg/dL — ABNORMAL HIGH (ref 70–99)
Potassium: 4.1 mmol/L (ref 3.5–5.1)
Sodium: 139 mmol/L (ref 135–145)

## 2021-07-18 LAB — URINALYSIS, COMPLETE (UACMP) WITH MICROSCOPIC
Bacteria, UA: NONE SEEN
Bilirubin Urine: NEGATIVE
Glucose, UA: NEGATIVE mg/dL
Hgb urine dipstick: NEGATIVE
Ketones, ur: NEGATIVE mg/dL
Leukocytes,Ua: NEGATIVE
Nitrite: NEGATIVE
Protein, ur: 100 mg/dL — AB
Specific Gravity, Urine: 1.006 (ref 1.005–1.030)
pH: 7 (ref 5.0–8.0)

## 2021-07-18 LAB — CBC
HCT: 30.3 % — ABNORMAL LOW (ref 36.0–46.0)
Hemoglobin: 9.7 g/dL — ABNORMAL LOW (ref 12.0–15.0)
MCH: 28.9 pg (ref 26.0–34.0)
MCHC: 32 g/dL (ref 30.0–36.0)
MCV: 90.2 fL (ref 80.0–100.0)
Platelets: 228 10*3/uL (ref 150–400)
RBC: 3.36 MIL/uL — ABNORMAL LOW (ref 3.87–5.11)
RDW: 14.8 % (ref 11.5–15.5)
WBC: 6.1 10*3/uL (ref 4.0–10.5)
nRBC: 0 % (ref 0.0–0.2)

## 2021-07-18 LAB — RESP PANEL BY RT-PCR (FLU A&B, COVID) ARPGX2
Influenza A by PCR: NEGATIVE
Influenza B by PCR: NEGATIVE
SARS Coronavirus 2 by RT PCR: NEGATIVE

## 2021-07-18 LAB — CBG MONITORING, ED: Glucose-Capillary: 124 mg/dL — ABNORMAL HIGH (ref 70–99)

## 2021-07-18 LAB — TROPONIN I (HIGH SENSITIVITY): Troponin I (High Sensitivity): 8 ng/L (ref <18)

## 2021-07-18 LAB — BRAIN NATRIURETIC PEPTIDE: B Natriuretic Peptide: 289.7 pg/mL — ABNORMAL HIGH (ref 0.0–100.0)

## 2021-07-18 MED ORDER — FUROSEMIDE 10 MG/ML IJ SOLN
40.0000 mg | Freq: Once | INTRAMUSCULAR | Status: AC
  Administered 2021-07-18: 40 mg via INTRAVENOUS
  Filled 2021-07-18: qty 4

## 2021-07-18 MED ORDER — IOHEXOL 350 MG/ML SOLN
75.0000 mL | Freq: Once | INTRAVENOUS | Status: AC | PRN
  Administered 2021-07-18: 75 mL via INTRAVENOUS

## 2021-07-18 NOTE — ED Provider Notes (Signed)
[facility] Emergency Department Provider Note ____________________________________________   Event Date/Time   First MD Initiated Contact with Patient 07/18/21 1036     (approximate)  I have reviewed the triage vital signs and the nursing notes.   HISTORY  Chief Complaint Shortness of Breath    HPI Karen Murphy is a 70 y.o. female with PMH as noted below including DM, atrial fibrillation and CHF presents with shortness of breath since yesterday, now somewhat improved, and worse with exertion even minimal exertion such as walking across the room.  She denies chest pain, cough, or fever, although has some tightness or pressure in her chest.  She has gained about 6 pounds over the last 1 to 2 days but denies any swelling.  She is taking all of her medications as prescribed and took Lasix this morning.  Past Medical History:  Diagnosis Date   Anemia    Arrhythmia    atrial fibrillation   Arthritis    CHF (congestive heart failure) (HCC)    Diabetes mellitus without complication (HCC)    Hypertension     Patient Active Problem List   Diagnosis Date Noted   Acute on chronic diastolic CHF (congestive heart failure) (HCC) 11/21/2020   Flash pulmonary edema (HCC) 11/19/2020   AF (paroxysmal atrial fibrillation) (HCC) 11/19/2020   Atrial fibrillation with rapid ventricular response (HCC) 07/24/2020   Syncope and collapse 07/24/2020   Community acquired pneumonia of right lower lobe of lung    Essential hypertension    Acute respiratory failure (HCC) 04/13/2020   Lobar pneumonia (HCC)    Type 2 diabetes mellitus without complication, with long-term current use of insulin (HCC)     No past surgical history on file.  Prior to Admission medications   Medication Sig Start Date End Date Taking? Authorizing Provider  acetaminophen (TYLENOL) 325 MG tablet Take 650 mg by mouth every 6 (six) hours as needed for mild pain or moderate pain.    [provider]  apixaban (ELIQUIS) 5 MG TABS tablet Take 1 tablet (5 mg total) by mouth 2 (two) times daily. 09/14/20   Antonieta Iba, MD  ascorbic acid (VITAMIN C) 500 MG tablet Take 500 mg by mouth daily.    [provider]  atorvastatin (LIPITOR) 40 MG tablet Take 1 tablet (40 mg total) by mouth every evening. 09/14/20   Antonieta Iba, MD  Calcium Carbonate-Vitamin D3 600-400 MG-UNIT TABS Take 1 tablet by mouth daily.    [provider]  cephALEXin (KEFLEX) 500 MG capsule Take 500 mg by mouth 2 (two) times daily.    [provider]  furosemide (LASIX) 20 MG tablet Take 1 tablet (20 mg total) by mouth daily. 12/15/20 12/15/21  Delma Freeze, FNP  glipiZIDE (GLUCOTROL XL) 10 MG 24 hr tablet Take 10 mg by mouth daily. 02/03/20   [provider]  losartan (COZAAR) 100 MG tablet Take 0.5 tablets (50 mg total) by mouth daily. 09/14/20   Antonieta Iba, MD  magnesium oxide (MAG-OX) 400 MG tablet Take 400 mg by mouth 2 (two) times daily.    [provider]  metFORMIN (GLUCOPHAGE-XR) 500 MG 24 hr tablet Take 1,500 mg by mouth daily with supper. 10/19/19   [provider]  metoprolol tartrate (LOPRESSOR) 50 MG tablet Take 1.5 tablets (75 mg total) by mouth 2 (two) times daily. 09/14/20   Antonieta Iba, MD  ondansetron (ZOFRAN) 8 MG tablet Take 8 mg by mouth every  8 (eight) hours as needed for nausea or vomiting.    [provider]  TOUJEO SOLOSTAR 300 UNIT/ML Solostar Pen Inject 18 Units into the skin at bedtime.    [provider]  TRULICITY 0.75 MG/0.5ML SOPN Inject 0.75 mg into the skin every Sunday.    [provider]  verapamil (CALAN-SR) 120 MG CR tablet Take 120 mg by mouth daily in the afternoon.    [provider]    Allergies Diltiazem  Family History  Problem Relation Age of Onset   Hypertension Mother    Hypertension Father     Social History Social History   Tobacco Use   Smoking  status: Former   Smokeless tobacco: Never    Review of Systems  Constitutional: No fever/chills Eyes: No visual changes. ENT: No sore throat. Cardiovascular: Denies chest pain. Respiratory: Positive for shortness of breath. Gastrointestinal: No vomiting or diarrhea.  Genitourinary: Negative for dysuria.  Musculoskeletal: Negative for back pain. Skin: Negative for rash. Neurological: Negative for headaches, focal weakness or numbness.   ____________________________________________   PHYSICAL EXAM:  VITAL SIGNS: ED Triage Vitals  Enc Vitals Group     BP 07/18/21 0923 (!) 160/82     Pulse Rate 07/18/21 0923 74     Resp 07/18/21 0923 15     Temp 07/18/21 0923 98.3 F (36.8 C)     Temp Source 07/18/21 0923 Oral     SpO2 07/18/21 0923 96 %     Weight 07/18/21 0921 202 lb (91.6 kg)     Height 07/18/21 0921 5\' 6"  (1.676 m)     Head Circumference --      Peak Flow --      Pain Score 07/18/21 0921 0     Pain Loc --      Pain Edu? --      Excl. in GC? --     Constitutional: Alert and oriented. Well appearing and in no acute distress. Eyes: Conjunctivae are normal.  Head: Atraumatic. Nose: No congestion/rhinnorhea. Mouth/Throat: Mucous membranes are moist.   Neck: Normal range of motion.  Cardiovascular: Normal rate, regular rhythm. Grossly normal heart sounds.  Good peripheral circulation. Respiratory: Normal respiratory effort.  No retractions. Lungs CTAB. Gastrointestinal: No distention.  Musculoskeletal: No lower extremity edema.  Extremities warm and well perfused.  Neurologic:  Normal speech and language. No gross focal neurologic deficits are appreciated.  Skin:  Skin is warm and dry. No rash noted. Psychiatric: Mood and affect are normal. Speech and behavior are normal.  ____________________________________________   LABS (all labs ordered are listed, but only abnormal results are displayed)  Labs Reviewed  CBC - Abnormal; Notable for the following  components:      Result Value   RBC 3.36 (*)    Hemoglobin 9.7 (*)    HCT 30.3 (*)    All other components within normal limits  BASIC METABOLIC PANEL - Abnormal; Notable for the following components:   Glucose, Bld 182 (*)    All other components within normal limits  BRAIN NATRIURETIC PEPTIDE - Abnormal; Notable for the following components:   B Natriuretic Peptide 289.7 (*)    All other components within normal limits  URINALYSIS, COMPLETE (UACMP) WITH MICROSCOPIC - Abnormal; Notable for the following components:   Color, Urine STRAW (*)    APPearance CLEAR (*)    Protein, ur 100 (*)    All other components within normal limits  CBG MONITORING, ED - Abnormal; Notable for the following components:  Glucose-Capillary 124 (*)    All other components within normal limits  RESP PANEL BY RT-PCR (FLU A&B, COVID) ARPGX2  TROPONIN I (HIGH SENSITIVITY)   ____________________________________________  EKG  ED ECG REPORT I, Dionne Bucy, the attending physician, personally viewed and interpreted this ECG.  Date: 07/18/2021 EKG Time: 0924 Rate: 74 Rhythm: Atrial fibrillation QRS Axis: normal Intervals: normal ST/T Wave abnormalities: normal Narrative Interpretation: no evidence of acute ischemia  ____________________________________________  RADIOLOGY  Chest x-ray interpreted by me shows chronic appearing interstitial opacities with no focal consolidation or edema CT angio chest:  IMPRESSION:  No evidence of pulmonary embolism.     Small BILATERAL pleural effusions and bibasilar atelectasis, greater  on RIGHT.     Aortic Atherosclerosis (ICD10-I70.0).    ____________________________________________   PROCEDURES  Procedure(s) performed: No  Procedures  Critical Care performed: No ____________________________________________   INITIAL IMPRESSION / ASSESSMENT AND PLAN / ED COURSE  Pertinent labs & imaging results that were available during my care of the  patient were reviewed by me and considered in my medical decision making (see chart for details).   70 year old female with PMH as noted above including DM, atrial fibrillation, and CHF presents with shortness of breath since yesterday along with relatively acute weight gain and some chest discomfort.  I reviewed the past medical records in Epic.  The patient was most recently admitted in February with acute on chronic CHF and respiratory failure requiring supplemental oxygen and IV Lasix.  On exam today the patient is well-appearing.  Her vital signs are normal except for mild hypertension.  O2 saturation is 100% on room air and the patient does not demonstrate any respiratory distress or increased work of breathing.  There is no significant peripheral edema.  Physical exam is otherwise unremarkable.  EKG is nonischemic.   Chest x-ray obtained from triage shows no significant edema.  Although the patient's history would be suggestive of acute CHF exacerbation, given the lack of peripheral edema, no significant interstitial edema seen on x-ray, relatively low BNP, and lack of rales on exam makes this unlikely.  Differential includes ACS, acute bronchitis, COVID-19, or less likely PE.  We will add on cardiac enzymes, respiratory panel, and a CT angio.  ----------------------------------------- 2:20 PM on 07/18/2021 -----------------------------------------  BNP is mildly elevated.  Troponin is normal.  No indication for repeat based on the duration of the symptoms.  CT angio of the chest shows small pleural effusions but is otherwise negative for PE or for edema.  On reassessment, the patient continues to appear comfortable and is not requiring any supplemental oxygen.  She is stable for discharge home.  I gave a dose of IV Lasix given the effusions and the increased shortness of breath, and I instructed her to continue her home medications and follow-up with her PMD.  Return precautions given,  and she expresses understanding.  ____________________________________________   FINAL CLINICAL IMPRESSION(S) / ED DIAGNOSES  Final diagnoses:  Shortness of breath      NEW MEDICATIONS STARTED DURING THIS VISIT:  New Prescriptions   No medications on file     Note:  This document was prepared using Dragon voice recognition software and may include unintentional dictation errors.    Dionne Bucy, MD 07/18/21 1421

## 2021-07-18 NOTE — ED Triage Notes (Signed)
Pt here with SOB that started yesterday but she thought she was just tired until she got up this AM and was still SOB. Pt does not wear oxygen at home. Pt denies pain, N/V/D.

## 2021-07-18 NOTE — Telephone Encounter (Signed)
Faithe contacted me this morning stating she is short of breath, through the phone could here her breathing, she can not talk in full sentences.  She states started yesterday and she could sit and would go away but during the night got worse.  This am she states she just can not breath.  Contacted 911 for her since the breathing was so bad and they could get there before I could.  She states her weight is up to 202 lbs which is a 6 lbs weight gain from last week.  She was shocked to see that it was that high.  Stayed on phone til someone arrived.  Will follow her through her hospital stay.  Had a visit last week and she had been following her low sodium diet and watching how much fluids.  Will continue to visit for heart failure.   Earmon Phoenix [place] EMT-Paramedic [phone]

## 2021-07-18 NOTE — ED Notes (Signed)
Pt ambulated to toilet.  Reports SHOB upon returning to bed.  States that this happens at home as well.  Current O2 sat 97 on RA, pt reports SHOB currently resolving with rest.

## 2021-07-18 NOTE — ED Triage Notes (Addendum)
Note made on wrong pt.

## 2021-07-18 NOTE — ED Triage Notes (Signed)
Arrived to ed via ems from home with c/o SOB starting yesterday. Initial ems oxygen sat was 90%RA. Ems reports lung sounds clear on top diminished on bottom.  Pt a&o. Ems reports pt has not taken medications for HTN, or diabetes  this AM.   Hx COPD, CHF, HTN, afib.  Ems vitals 97% on 3LNC 174/92 Cbg 210 98.1 temp HR 60-100

## 2021-07-18 NOTE — ED Notes (Signed)
MD Siadecki at bedside examining pt.

## 2021-07-18 NOTE — Discharge Instructions (Addendum)
Your CT scan shows small areas of fluid at the bottom of your lungs called effusions.  These are likely related to your CHF.  Continue taking your Lasix and other medications as prescribed.  Follow-up with your primary care doctor within the next 1 to 2 weeks.  Return to the emergency department for new, worsening, or persistent severe shortness of breath, chest pain, weakness or lightheadedness, or any other new or worsening symptoms that concern you.

## 2021-08-01 ENCOUNTER — Ambulatory Visit: Payer: Medicare Other | Attending: Family | Admitting: Family

## 2021-08-01 ENCOUNTER — Encounter: Payer: Self-pay | Admitting: Family

## 2021-08-01 ENCOUNTER — Other Ambulatory Visit: Payer: Self-pay

## 2021-08-01 VITALS — BP 159/99 | HR 66 | Resp 18 | Ht 66.0 in | Wt 203.1 lb

## 2021-08-01 DIAGNOSIS — R110 Nausea: Secondary | ICD-10-CM | POA: Insufficient documentation

## 2021-08-01 DIAGNOSIS — Z794 Long term (current) use of insulin: Secondary | ICD-10-CM | POA: Diagnosis not present

## 2021-08-01 DIAGNOSIS — I10 Essential (primary) hypertension: Secondary | ICD-10-CM | POA: Diagnosis not present

## 2021-08-01 DIAGNOSIS — I110 Hypertensive heart disease with heart failure: Secondary | ICD-10-CM | POA: Diagnosis not present

## 2021-08-01 DIAGNOSIS — E119 Type 2 diabetes mellitus without complications: Secondary | ICD-10-CM | POA: Insufficient documentation

## 2021-08-01 DIAGNOSIS — I5032 Chronic diastolic (congestive) heart failure: Secondary | ICD-10-CM

## 2021-08-01 DIAGNOSIS — Z79899 Other long term (current) drug therapy: Secondary | ICD-10-CM | POA: Insufficient documentation

## 2021-08-01 DIAGNOSIS — Z8249 Family history of ischemic heart disease and other diseases of the circulatory system: Secondary | ICD-10-CM | POA: Insufficient documentation

## 2021-08-01 DIAGNOSIS — I480 Paroxysmal atrial fibrillation: Secondary | ICD-10-CM | POA: Diagnosis not present

## 2021-08-01 NOTE — Progress Notes (Signed)
[name], female    DOB: Feb 19, 1951, 70 y.o.   MRN: [id number]  HPI  Karen Murphy is a 70 y/o female with a history of DM, HTN, anemia, atrial fibrillation, previous tobacco use and chronic heart failure.   Echo report from 07/25/20 reviewed and showed an EF of 60-65% along with mild LAE.  Was in the ED 07/18/21 due to shortness of breath along with 6 pound weight gain over 2 days. Dose of IV lasix given for pleural effusions and she was released.   She presents today for a follow-up visit with a chief complaint of moderate fatigue upon minimal exertion. She describes this as chronic in nature having been present for several years. She has associated shortness of breath, nausea and slight weight gain along with this. She denies any difficulty sleeping, abdominal distention, palpitations, pedal edema, chest pain, cough or dizziness.   Says that she felt really good yesterday and did quite a bit of walking at the store. Also recently helped out with a friend's family member funeral. Did eat a salad that she bought from [employer] yesterday and it did have bacon bits on it and she used the [place] dressing that was supplied. Ate half for lunch yesterday and the other half for supper. Ate a croissant this morning for breakfast because she wanted something on her stomach before taking her medications.   Has had some nausea but no vomiting over the last couple of days as well. Denies any known covid exposure and she says that she wears her mask "all the time".   Past Medical History:  Diagnosis Date   Anemia    Arrhythmia    atrial fibrillation   Arthritis    CHF (congestive heart failure) (HCC)    Diabetes mellitus without complication (HCC)    Hypertension    No past surgical history on file. Family History  Problem Relation Age of Onset   Hypertension Mother    Hypertension Father    Social History   Tobacco Use   Smoking status: Former   Smokeless tobacco: Never  Substance  Use Topics   Alcohol use: Not on file   Allergies  Allergen Reactions   Diltiazem Rash   Prior to Admission medications   Medication Sig Start Date End Date Taking? Authorizing Provider  acetaminophen (TYLENOL) 325 MG tablet Take 650 mg by mouth every 6 (six) hours as needed for mild pain or moderate pain.   Yes [provider]  apixaban (ELIQUIS) 5 MG TABS tablet Take 1 tablet (5 mg total) by mouth 2 (two) times daily. 09/14/20  Yes Gollan, Tollie Pizza, MD  ascorbic acid (VITAMIN C) 500 MG tablet Take 500 mg by mouth daily.   Yes [provider]  atorvastatin (LIPITOR) 40 MG tablet Take 1 tablet (40 mg total) by mouth every evening. 09/14/20  Yes Antonieta Iba, MD  Calcium Carbonate-Vitamin D3 600-400 MG-UNIT TABS Take 1 tablet by mouth daily.   Yes [provider]  cephALEXin (KEFLEX) 500 MG capsule Take 500 mg by mouth 2 (two) times daily.   Yes [provider]  folic acid (FOLVITE) 400 MCG tablet Take 400 mcg by mouth daily.   Yes [provider]  furosemide (LASIX) 20 MG tablet Take 1 tablet (20 mg total) by mouth daily. 12/15/20 12/15/21 Yes Farzana Koci A, FNP  glipiZIDE (GLUCOTROL XL) 10 MG 24 hr tablet Take 10 mg by mouth daily. 02/03/20  Yes [provider]  losartan (  COZAAR) 100 MG tablet Take 0.5 tablets (50 mg total) by mouth daily. 09/14/20  Yes Gollan, Tollie Pizza, MD  magnesium oxide (MAG-OX) 400 MG tablet Take 400 mg by mouth 2 (two) times daily.   Yes [provider]  metFORMIN (GLUCOPHAGE-XR) 500 MG 24 hr tablet Take 1,500 mg by mouth daily with supper. 10/19/19  Yes [provider]  metoprolol tartrate (LOPRESSOR) 50 MG tablet Take 1.5 tablets (75 mg total) by mouth 2 (two) times daily. 09/14/20  Yes Gollan, Tollie Pizza, MD  ondansetron (ZOFRAN) 8 MG tablet Take 8 mg by mouth every 8 (eight) hours as needed for nausea or vomiting.   Yes [provider]  TOUJEO SOLOSTAR 300 UNIT/ML Solostar Pen Inject 18  Units into the skin at bedtime.   Yes [provider]  TRULICITY 0.75 MG/0.5ML SOPN Inject 0.75 mg into the skin every Sunday.   Yes [provider]  verapamil (CALAN-SR) 120 MG CR tablet Take 120 mg by mouth daily in the afternoon.   Yes [provider]   Review of Systems  Constitutional:  Positive for fatigue (tire easily). Negative for appetite change.  HENT:  Negative for congestion, postnasal drip and sore throat.   Eyes: Negative.   Respiratory:  Positive for shortness of breath (minimal). Negative for cough and chest tightness.   Cardiovascular:  Negative for chest pain, palpitations and leg swelling.  Gastrointestinal:  Positive for nausea. Negative for abdominal distention and abdominal pain.  Endocrine: Negative.   Genitourinary: Negative.   Musculoskeletal:  Positive for myalgias (both upper arms at times). Negative for back pain and neck pain.  Skin: Negative.   Allergic/Immunologic: Negative.   Neurological:  Negative for dizziness and light-headedness.  Hematological:  Negative for adenopathy. Does not bruise/bleed easily.  Psychiatric/Behavioral:  Negative for dysphoric mood and sleep disturbance (sleeping well on wedge pillow). The patient is not nervous/anxious.    Vitals:   08/01/21 1038  BP: (!) 159/99  Pulse: 66  Resp: 18  SpO2: 97%  Weight: 203 lb 2 oz (92.1 kg)  Height: 5\' 6"  (1.676 m)   Wt Readings from Last 3 Encounters:  08/01/21 203 lb 2 oz (92.1 kg)  07/18/21 202 lb (91.6 kg)  07/11/21 196 lb (88.9 kg)   Lab Results  Component Value Date   CREATININE 0.87 07/18/2021   CREATININE 0.76 11/21/2020   CREATININE 0.65 11/20/2020   Physical Exam Vitals and nursing note reviewed.  Constitutional:      Appearance: Normal appearance.  HENT:     Head: Normocephalic and atraumatic.  Cardiovascular:     Rate and Rhythm: Normal rate and regular rhythm.  Pulmonary:     Effort: Pulmonary effort is normal. No respiratory distress.      Breath sounds: No wheezing or rales.  Abdominal:     General: There is no distension.     Palpations: Abdomen is soft.  Musculoskeletal:        General: No tenderness.     Cervical back: Normal range of motion and neck supple.     Right lower leg: No edema.     Left lower leg: No edema.  Skin:    General: Skin is warm and dry.  Neurological:     General: No focal deficit present.     Mental Status: She is alert and oriented to person, place, and time.  Psychiatric:        Mood and Affect: Mood normal.  Behavior: Behavior normal.        Thought Content: Thought content normal.   Assessment & Plan:  1: Chronic heart failure with preserved ejection fraction with structural changes (LAE)- - NYHA class III - euvolemic today - weighing daily; reminded to call for an overnight weight gain of > 2 pounds or a weekly weight gain of > 5 pounds - weight up 5 pounds from last visit here 6 months ago - not adding salt and has been diligent about reading food labels so that she can follow a low sodium diet although may have gotten more salt in her diet recently  - saw cardiology Mariah Milling) 03/16/21 - explained that she may have overdid it yesterday with her activity because she felt so good - BNP 07/18/21 was 289.7 - participating in paramedicine program  - consider changing her losartan to entresto and adding jardiance at future visits  2: HTN- - BP elevated today (159/99); rechecked at the end of the visit was 160/96 - she does check her BP daily at home; encouraged her to alternate times of day that she checks it - saw PCP ([facility]) 02/08/21; returns 08/11/21 - will increase her losartan to 100mg  daily; will check BMP at next visit if not done at PCP office - BMP 07/18/21 reviewed and showed sodium 139, potassium 4.1, creatinine 0.87 and GFR >60  3: DM- - A1c 11/19/20 was 6.9% - fasting glucose at home today was 155 which is higher than her usual; she ate the croissant because that's  all she had and when she got to the office today, she checked it again in the parking lot and it was 306 - discussed how the carbs in the croissant probably elevated her glucose; she is to monitor it closely today & f/u with PCP if it remains elevated  4: Paroxysmal Atrial fibrillation- - previously wore Zio monitor November 2021 - currently rate controlled on verapamil and metoprolol tartrate   Medication bottles reviewed.   Should nausea continue, she may want to get covid tested just to be sure. She could possibly have a GI virus as well. Does have upcoming PCP appointment later next week on the 28th.   Return here in 6 weeks or sooner for any questions/problems before then.

## 2021-08-01 NOTE — Patient Instructions (Addendum)
Continue weighing daily and call for an overnight weight gain of > 2 pounds or a weekly weight gain of >5 pounds.    Increase losartan to 1 whole tablet every day.

## 2021-08-07 ENCOUNTER — Telehealth (HOSPITAL_COMMUNITY): Payer: Self-pay

## 2021-08-07 NOTE — Telephone Encounter (Signed)
Today had a phone call with Karen Murphy.  She states been doing well.  She is going out to take a friend shopping.  She states weight has been staying 197 to 200 lbs.  Today she is up to 200 lbs.  She states had some fried chicken and canned vegatables.  She states she is sure that is why.  She denies any problems such as chest pain, headaches, dizziness or increased shortness of breath.  She states been active.  She states has all her medications.  She states weighs daily and checks her vitals.  She states BG 219, bp was good and pulse.  She states she has everything for daily living.  Will continue to visit for heart failure, diet and medication management.   Earmon Phoenix Muniz EMT-Paramedic [phone]

## 2021-08-21 ENCOUNTER — Other Ambulatory Visit (HOSPITAL_COMMUNITY): Payer: Self-pay

## 2021-08-21 ENCOUNTER — Encounter (HOSPITAL_COMMUNITY): Payer: Self-pay

## 2021-08-21 NOTE — Progress Notes (Signed)
Today had a phone appt with Dura instead of in person.  She is doing well.  She states she has been having some problems with constipation, advised her to take a stool softner daily, may help.  She states has all her medications and she is aware of how to take them.  She states watching high sodium foods and measuring her liquids.  She states al most have her energy back.  She denies shortness of breath with activity.  She went out of town this weekend without trouble.  She is shopping in stores without trouble.  She is aware of up coming appts.  She denies chest pain, headaches or dizziness.  She lives alone but visits a friend in [place] a lot.  They travel together when both are feeling well.  She has family support close by.  She is involved in a lot of activities.  Will continue to visit for heart failure, diet and medication compliance.   Earmon Phoenix [place] EMT-Paramedic [phone]

## 2021-09-01 ENCOUNTER — Other Ambulatory Visit: Payer: Self-pay

## 2021-09-01 ENCOUNTER — Ambulatory Visit: Payer: Medicare Other | Attending: Family | Admitting: Family

## 2021-09-01 ENCOUNTER — Other Ambulatory Visit
Admission: RE | Admit: 2021-09-01 | Discharge: 2021-09-01 | Disposition: A | Discharge status: Home / Self Care | Payer: Medicare Other | Source: Ambulatory Visit | Attending: Family | Admitting: Family

## 2021-09-01 ENCOUNTER — Encounter: Payer: Self-pay | Admitting: Family

## 2021-09-01 VITALS — BP 155/89 | HR 80 | Resp 18 | Ht 66.0 in | Wt 201.5 lb

## 2021-09-01 DIAGNOSIS — M79602 Pain in left arm: Secondary | ICD-10-CM | POA: Insufficient documentation

## 2021-09-01 DIAGNOSIS — E119 Type 2 diabetes mellitus without complications: Secondary | ICD-10-CM | POA: Diagnosis not present

## 2021-09-01 DIAGNOSIS — D649 Anemia, unspecified: Secondary | ICD-10-CM | POA: Insufficient documentation

## 2021-09-01 DIAGNOSIS — M79601 Pain in right arm: Secondary | ICD-10-CM | POA: Insufficient documentation

## 2021-09-01 DIAGNOSIS — J90 Pleural effusion, not elsewhere classified: Secondary | ICD-10-CM | POA: Insufficient documentation

## 2021-09-01 DIAGNOSIS — I5032 Chronic diastolic (congestive) heart failure: Secondary | ICD-10-CM | POA: Insufficient documentation

## 2021-09-01 DIAGNOSIS — Z87891 Personal history of nicotine dependence: Secondary | ICD-10-CM | POA: Insufficient documentation

## 2021-09-01 DIAGNOSIS — Z79899 Other long term (current) drug therapy: Secondary | ICD-10-CM | POA: Diagnosis not present

## 2021-09-01 DIAGNOSIS — I10 Essential (primary) hypertension: Secondary | ICD-10-CM | POA: Diagnosis not present

## 2021-09-01 DIAGNOSIS — I480 Paroxysmal atrial fibrillation: Secondary | ICD-10-CM | POA: Diagnosis not present

## 2021-09-01 DIAGNOSIS — I110 Hypertensive heart disease with heart failure: Secondary | ICD-10-CM | POA: Diagnosis present

## 2021-09-01 DIAGNOSIS — Z794 Long term (current) use of insulin: Secondary | ICD-10-CM

## 2021-09-01 LAB — BASIC METABOLIC PANEL
Anion gap: 7 (ref 5–15)
BUN: 18 mg/dL (ref 8–23)
CO2: 29 mmol/L (ref 22–32)
Calcium: 9.3 mg/dL (ref 8.9–10.3)
Chloride: 102 mmol/L (ref 98–111)
Creatinine, Ser: 0.66 mg/dL (ref 0.44–1.00)
GFR, Estimated: >60 mL/min (ref >60)
Glucose, Bld: 107 mg/dL — ABNORMAL HIGH (ref 70–99)
Potassium: 3.9 mmol/L (ref 3.5–5.1)
Sodium: 138 mmol/L (ref 135–145)

## 2021-09-01 NOTE — Progress Notes (Signed)
Patient ID: Keyarah Mcroy, female    DOB: 1950/10/23, 70 y.o.   MRN: [id number]  HPI  Ms Hoston is a 70 y/o female with a history of DM, HTN, anemia, atrial fibrillation, previous tobacco use and chronic heart failure.   Echo report from 07/25/20 reviewed and showed an EF of 60-65% along with mild LAE.  Was in the ED 07/18/21 due to shortness of breath along with 6 pound weight gain over 2 days. Dose of IV lasix given for pleural effusions and she was released.   She presents today for a follow-up visit with a chief complaint of minimal shortness of breath upon moderate exertion. She says that this has been present for several  years. She has associated fatigue and intermittent bilateral arm pain along with this. She denies any dizziness, difficulty sleeping, abdominal distention, palpitations, pedal edema, chest pain, cough or weight gain   Overall says that she feels "great" since having losartan increased at last visit.   Does mention that her eliquis is quite expensive.   Past Medical History:  Diagnosis Date   Anemia    Arrhythmia    atrial fibrillation   Arthritis    CHF (congestive heart failure) (HCC)    Diabetes mellitus without complication (HCC)    Hypertension    No past surgical history on file. Family History  Problem Relation Age of Onset   Hypertension Mother    Hypertension Father    Social History   Tobacco Use   Smoking status: Former   Smokeless tobacco: Never  Substance Use Topics   Alcohol use: Not on file   Allergies  Allergen Reactions   Diltiazem Rash   Prior to Admission medications   Medication Sig Start Date End Date Taking? Authorizing Provider  acetaminophen (TYLENOL) 325 MG tablet Take 650 mg by mouth every 6 (six) hours as needed for mild pain or moderate pain.   Yes [provider]  apixaban (ELIQUIS) 5 MG TABS tablet Take 1 tablet (5 mg total) by mouth 2 (two) times daily. 09/14/20  Yes Gollan, Tollie Pizza, MD  ascorbic acid  (VITAMIN C) 500 MG tablet Take 500 mg by mouth daily.   Yes [provider]  atorvastatin (LIPITOR) 40 MG tablet Take 1 tablet (40 mg total) by mouth every evening. 09/14/20  Yes Antonieta Iba, MD  Calcium Carbonate-Vitamin D3 600-400 MG-UNIT TABS Take 1 tablet by mouth daily.   Yes [provider]  cephALEXin (KEFLEX) 500 MG capsule Take 500 mg by mouth 2 (two) times daily.   Yes [provider]  folic acid (FOLVITE) 400 MCG tablet Take 400 mcg by mouth daily.   Yes [provider]  furosemide (LASIX) 20 MG tablet Take 1 tablet (20 mg total) by mouth daily. 12/15/20 12/15/21 Yes Coltin Casher A, FNP  glipiZIDE (GLUCOTROL XL) 10 MG 24 hr tablet Take 10 mg by mouth daily. 02/03/20  Yes [provider]  losartan (COZAAR) 100 MG tablet Take 0.5 tablets (50 mg total) by mouth daily. Patient taking differently: Take 100 mg by mouth daily. 09/14/20  Yes Gollan, Tollie Pizza, MD  magnesium oxide (MAG-OX) 400 MG tablet Take 400 mg by mouth 2 (two) times daily.   Yes [provider]  metFORMIN (GLUCOPHAGE-XR) 500 MG 24 hr tablet Take 1,500 mg by mouth daily with supper. 10/19/19  Yes [provider]  metoprolol tartrate (LOPRESSOR) 50 MG tablet Take 1.5 tablets (75 mg total) by mouth 2 (two) times daily. 09/14/20  Yes Gollan, Tollie Pizza, MD  ondansetron (ZOFRAN) 8 MG tablet Take 8 mg by mouth every 8 (eight) hours as needed for nausea or vomiting.   Yes [provider]  TOUJEO SOLOSTAR 300 UNIT/ML Solostar Pen Inject 18 Units into the skin at bedtime.   Yes [provider]  TRULICITY 0.75 MG/0.5ML SOPN Inject 0.75 mg into the skin every Sunday.   Yes [provider]  verapamil (CALAN-SR) 120 MG CR tablet Take 120 mg by mouth daily in the afternoon.   Yes [provider]   Review of Systems  Constitutional:  Positive for fatigue ("much better"). Negative for appetite change.  HENT:  Negative for congestion, postnasal  drip and sore throat.   Eyes: Negative.   Respiratory:  Positive for shortness of breath (minimal). Negative for cough and chest tightness.   Cardiovascular:  Negative for chest pain, palpitations and leg swelling.  Gastrointestinal:  Negative for abdominal distention and abdominal pain.  Endocrine: Negative.   Genitourinary: Negative.   Musculoskeletal:  Positive for myalgias (both upper arms at times). Negative for back pain and neck pain.  Skin: Negative.   Allergic/Immunologic: Negative.   Neurological:  Negative for dizziness and light-headedness.  Hematological:  Negative for adenopathy. Does not bruise/bleed easily.  Psychiatric/Behavioral:  Negative for dysphoric mood and sleep disturbance (sleeping well on wedge pillow). The patient is not nervous/anxious.    Vitals:   09/01/21 1022 09/01/21 1027  BP: (!) 173/104 (!) 155/89  Pulse: 80   Resp: 18   SpO2: 99%   Weight: 201 lb 8 oz (91.4 kg)   Height: 5\' 6"  (1.676 m)    Wt Readings from Last 3 Encounters:  09/01/21 201 lb 8 oz (91.4 kg)  08/21/21 199 lb (90.3 kg)  08/01/21 203 lb 2 oz (92.1 kg)   Lab Results  Component Value Date   CREATININE 0.87 07/18/2021   CREATININE 0.76 11/21/2020   CREATININE 0.65 11/20/2020    Physical Exam Vitals and nursing note reviewed.  Constitutional:      Appearance: Normal appearance.  HENT:     Head: Normocephalic and atraumatic.  Cardiovascular:     Rate and Rhythm: Normal rate and regular rhythm.  Pulmonary:     Effort: Pulmonary effort is normal. No respiratory distress.     Breath sounds: No wheezing or rales.  Abdominal:     General: There is no distension.     Palpations: Abdomen is soft.  Musculoskeletal:        General: No tenderness.     Cervical back: Normal range of motion and neck supple.     Right lower leg: No edema.     Left lower leg: No edema.  Skin:    General: Skin is warm and dry.  Neurological:     General: No focal deficit present.     Mental  Status: She is alert and oriented to person, place, and time.  Psychiatric:        Mood and Affect: Mood normal.        Behavior: Behavior normal.        Thought Content: Thought content normal.   Assessment & Plan:  1: Chronic heart failure with preserved ejection fraction with structural changes (LAE)- - NYHA class III - euvolemic today - weighing daily; reminded to call for an overnight weight gain of > 2 pounds or a weekly weight gain of > 5 pounds - weight down 2 pounds from last visit here 1 month ago -  not adding salt and has been diligent about reading food labels so that she can follow a low sodium diet  - saw cardiology Mariah Milling) 03/16/21 - consider changing losartan to entresto but would need patient assistance - BNP 07/18/21 was 289.7 - participating in paramedicine program  - received flu vaccine for this season  2: HTN- - BP initially elevated (173/104) but she had just taken her medications; rechecked at the end of the visit and was better (155/89) - says that her home BP's are 130-150's/ 70-80's - saw PCP [facility] [place]) 08/18/21 - will check BMP today since losartan was increased at last visit - BMP 07/18/21 reviewed and showed sodium 139, potassium 4.1, creatinine 0.87 and GFR >60  3: Type 2 DM- - A1c 08/18/21 was 6.9% - fasting glucose at home today was 84  4: Paroxysmal Atrial fibrillation- - previously wore Zio monitor November 2021 - currently rate controlled on verapamil and metoprolol tartrate - eliquis patient assistance filled out today   Medication bottles reviewed.   Return in 4 months or sooner for any questions/problems before then.

## 2021-09-01 NOTE — Patient Instructions (Signed)
Continue weighing daily and call for an overnight weight gain of > 2 pounds or a weekly weight gain of >5 pounds. 

## 2021-09-19 ENCOUNTER — Other Ambulatory Visit: Payer: Self-pay | Admitting: Cardiovascular Disease

## 2021-09-19 DIAGNOSIS — I480 Paroxysmal atrial fibrillation: Secondary | ICD-10-CM

## 2021-09-20 NOTE — Telephone Encounter (Signed)
Refill request

## 2021-09-20 NOTE — Telephone Encounter (Signed)
Eliquis 5 mg refill request received. Patient is 70 years old, weight- 91.4kg, Crea-0.66 on 09/01/21 , Diagnosis- PAF, and last seen by Dr. Mariah Milling on 03/16/21. Dose is appropriate based on dosing criteria. Will send in refill to requested pharmacy.

## 2021-09-26 ENCOUNTER — Other Ambulatory Visit (HOSPITAL_COMMUNITY): Payer: Self-pay

## 2021-09-26 ENCOUNTER — Telehealth: Payer: Self-pay | Admitting: Family

## 2021-09-26 NOTE — Telephone Encounter (Signed)
Patient was not approved to receive patient assitance for Eliquis due to household income; LVM with patient.   Veralyn Lopp, NT

## 2021-09-26 NOTE — Progress Notes (Signed)
Went to her home to have a quick visit and leave a little Christmas present but she was not home.  She did contact me back and advised she is doing well.  She states her weight is good, blood pressure and blood sugar.  She states she has her plans for Christmas and she is aware of up coming appts.  She has everything she needs for daily living.  She has all her medications and aware of how to take them.  Will continue to visit for heart failure, diet and medication management.   Earmon Phoenix  EMT-Paramedic [phone]

## 2021-09-27 ENCOUNTER — Other Ambulatory Visit: Payer: Self-pay | Admitting: Cardiovascular Disease

## 2021-09-27 DIAGNOSIS — I480 Paroxysmal atrial fibrillation: Secondary | ICD-10-CM

## 2021-09-28 NOTE — Telephone Encounter (Signed)
Prescription refill request for Eliquis received. Indication: PAF Last office visit: 09/01/21  Harlow Ohms FNP Scr: 0.66 on 09/01/21 Age: 70   Weight: 91.4kg  Based on above findings Eliquis 5mg  twice daily is the appropriate dose.  Refill approved.

## 2021-09-28 NOTE — Telephone Encounter (Signed)
Please review

## 2021-10-16 ENCOUNTER — Other Ambulatory Visit: Payer: Self-pay | Admitting: Family

## 2021-10-23 ENCOUNTER — Other Ambulatory Visit: Payer: Self-pay | Admitting: Cardiovascular Disease

## 2021-10-23 NOTE — Telephone Encounter (Signed)
Please schedule overdue 6 month F/U appointment. Thank you! °

## 2021-11-13 ENCOUNTER — Other Ambulatory Visit: Payer: Self-pay | Admitting: Cardiovascular Disease

## 2021-11-14 ENCOUNTER — Other Ambulatory Visit (HOSPITAL_COMMUNITY): Payer: Self-pay

## 2021-11-14 ENCOUNTER — Telehealth: Payer: Self-pay | Admitting: Family

## 2021-11-14 NOTE — Telephone Encounter (Signed)
Received phone call from [place] (paramedicine program) that patient has gained 5 pounds in the last 5 day. Recently ate some sausage. BP is 122/70 and no significant swelling in her legs.   Currently taking furosemide 20mg  daily. Advised Kristi to have patient take an additional 20mg  furosemide tomorrow and have her call Kristi tomorrow with update.   Kristi verbalized understanding.

## 2021-11-15 ENCOUNTER — Telehealth: Payer: Self-pay | Admitting: Family

## 2021-11-15 ENCOUNTER — Telehealth (HOSPITAL_COMMUNITY): Payer: Self-pay

## 2021-11-15 ENCOUNTER — Encounter (HOSPITAL_COMMUNITY): Payer: Self-pay

## 2021-11-15 NOTE — Telephone Encounter (Signed)
Karen Murphy contacted me advised she lost 2.4 lbs taking the extra furosemide yesterday.  Her weight is 205.2 lbs today.  Her normal weight is 200-201.  This is where she feels the best with activity.  She states feels much better but still some off from her normal.  Contacted [name] with HF clinic and she advised her to take an extra today and tomorrow.  Also to contact her with [name] weight tomorrow.  [name] is aware to me with her weight in the morning. She will rest, elevate her legs and watch closely fluid intake and eat low sodium food choices today.  She is aware to take an extra furosemide today, she is also aware of cardiology appt next week.   [place] [phone]

## 2021-11-15 NOTE — Progress Notes (Signed)
Today had a home visit with Karen Murphy.  She states doing ok.  She states getting short of breath with ambulating.  She states her fault she ate some sausage and cabbage.  She states not been doing her exercises either.  Her weight is up 5 lbs in 5 days.  Lungs are clear and vitals are good.  She denies any chest pain, headaches or dizziness.  Legs have some edema in them and abdomen is soft.  She states still measuring her water out.  She states she will watch high sodium foods for a while then she will slip.  Contacted [name] with HF clinic and advised her, she advised for her to take an extra lasix today and call with weight in the morning.  She has everything fro daily living.  She states will rest today, elevate her legs and watch closely what she eats.  She did take an extra lasix while I was there.  Will continue to visit for heart failure, diet and medication management.   Earmon Phoenix [place] EMT-Paramedic [phone]

## 2021-11-15 NOTE — Telephone Encounter (Signed)
Spoke with Earmon Phoenix (paramedicine) regarding patient. Yesterday I had patient take an additional 20mg  furosemide due to weight gain. Weight is down 2.4 pounds from yesterday and patient tells Silva Bandy that she feels better but she still feels like she has extra fluid on board. Even though weight down some overnight, it is still up from her baseline.   Advised Kristi to have patient take 2 furosemide today and another 2 tomorrow and call me tomorrow with update of weight and symptoms to make a plan for the weekend.   She sees [facility] on 11/21/21 and labs can be checked at that time. She verbalized understanding and will relay information to the patient

## 2021-11-16 ENCOUNTER — Telehealth (HOSPITAL_COMMUNITY): Payer: Self-pay

## 2021-11-16 NOTE — Telephone Encounter (Signed)
Karen Murphy states her weight went down another 1.2 lbs, she states breathing is better but still not quite back to normal.  She states a lot better.  Weight today is 204.0.  She will take another extra today of furosemide, rest, elevate her legs and watch fluids and sodium foods.  She will call with weight tomorrow.   [place] [phone]

## 2021-11-17 ENCOUNTER — Telehealth: Payer: Self-pay | Admitting: Family

## 2021-11-17 NOTE — Telephone Encounter (Signed)
Received call from [facility], paramedicine that patient's weight is down to 200.8 and she "feels great". Will reduce furosemide back to her 1 tablet daily. If she starts to retain fluid again, may need to do QOD dose adjustment. She sees cardiology next week.

## 2021-11-21 ENCOUNTER — Encounter: Payer: Self-pay | Admitting: Nurse Practitioner

## 2021-11-21 ENCOUNTER — Other Ambulatory Visit: Payer: Self-pay

## 2021-11-21 ENCOUNTER — Ambulatory Visit (INDEPENDENT_AMBULATORY_CARE_PROVIDER_SITE_OTHER): Payer: Medicare Other | Admitting: Nurse Practitioner

## 2021-11-21 VITALS — BP 130/80 | HR 76 | Ht 66.0 in | Wt 204.0 lb

## 2021-11-21 DIAGNOSIS — I10 Essential (primary) hypertension: Secondary | ICD-10-CM | POA: Diagnosis not present

## 2021-11-21 DIAGNOSIS — I4819 Other persistent atrial fibrillation: Secondary | ICD-10-CM | POA: Diagnosis not present

## 2021-11-21 DIAGNOSIS — E119 Type 2 diabetes mellitus without complications: Secondary | ICD-10-CM

## 2021-11-21 DIAGNOSIS — I5032 Chronic diastolic (congestive) heart failure: Secondary | ICD-10-CM | POA: Diagnosis not present

## 2021-11-21 DIAGNOSIS — E782 Mixed hyperlipidemia: Secondary | ICD-10-CM

## 2021-11-21 DIAGNOSIS — I480 Paroxysmal atrial fibrillation: Secondary | ICD-10-CM | POA: Diagnosis not present

## 2021-11-21 DIAGNOSIS — Z794 Long term (current) use of insulin: Secondary | ICD-10-CM

## 2021-11-21 NOTE — Patient Instructions (Signed)
Medication Instructions:  Your physician recommends that you continue on your current medications as directed. Please refer to the Current Medication list given to you today.  *If you need a refill on your cardiac medications before your next appointment, please call your pharmacy*   Lab Work: CBC and BMET today If you have labs (blood work) drawn today and your tests are completely normal, you will receive your results only by: [place] (if you have MyChart) OR A paper copy in the mail If you have any lab test that is abnormal or we need to change your treatment, we will call you to review the results.   Follow-Up: At [facility], you and your health needs are our priority.  As part of our continuing mission to provide you with exceptional heart care, we have created designated Provider Care Teams.  These Care Teams include your primary Cardiologist (physician) and Advanced Practice Providers (APPs -  Physician Assistants and Nurse Practitioners) who all work together to provide you with the care you need, when you need it.   Your next appointment:   6 month(s)  The format for your next appointment:   In Person  Provider:   Ida Rogue, MD or Murray Hodgkins, NP{

## 2021-11-21 NOTE — Progress Notes (Signed)
Office Visit    Patient Name: Karen Murphy Date of Encounter: 11/21/2021  Primary Care Provider:  [facility] Primary Cardiologist:  Ida Rogue, MD  Chief Complaint    71 year old female with a history of chronic HFpEF, hypertension, hyperlipidemia, paroxysmal atrial fibrillation on Eliquis, orthostasis/syncope, type 2 diabetes mellitus, remote tobacco abuse, and rheumatoid arthritis, who presents for follow-up of atrial fibrillation and heart failure.  Past Medical History    Past Medical History:  Diagnosis Date   Anemia    Arthritis    Chronic heart failure with preserved ejection fraction (HFpEF) ([place])    a. 07/2020 Echo: EF 60-65%, no rwma, nl RV fxn, mildly dil LA, mild-mod AoV sclerosis.   Diabetes mellitus without complication (H[facility])    Hypertension    PAF (paroxysmal atrial fibrillation) ([place])    7a. CHA2DS2VASc = 5-->eliquis; b. 08/2020 Zio: Predominantly sinus rhythm 6 bpm.  1% burden of PAF (longest 2 hours and 38 minutes at 121).   History reviewed. No pertinent surgical history.  Allergies  Allergies  Allergen Reactions   Diltiazem Rash    History of Present Illness    71 year old female with above past medical history including chronic HFpEF, hypertension, hyperlipidemia, paroxysmal atrial fibrillation, orthostasis/syncope, type 2 diabetes mellitus, remote tobacco abuse, and rheumatoid arthritis.  Prior echo in October 2021 showed normal LV function with mild to moderate aortic valve sclerosis.  ZIO monitoring in November 2021 showed sinus rhythm at an average rate of 76 bpm with a 1% burden of paroxysmal atrial fibrillation.  She has been chronically anticoagulated with Eliquis.  She was last seen by Dr. Rockey Situ in June 2022, at which time she was in asymptomatic A-fib, which was rate controlled.  She has since been following closely with para-medicine and heart failure clinic.  She was seen in the emergency department in October 2022 secondary  to dyspnea and mild volume overload requiring additional Lasix.  I note that she was in rate controlled A-fib at that time.  Earlier this month, she required an escalation in outpatient Lasix dosing secondary to weight gain.  Phone note February 3, indicates that her weight had come back down to 200.8 pounds and she was advised to reduce Lasix to 20 mg daily.  Today, she feels well.  Her weight was 202 on her home scale this morning which is within what she would consider her euvolemic range.  She has chronic, stable dyspnea on exertion and notes that her activity level at home in general is fairly low.  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  She does not experience chest pain.  Currently she denies any edema, palpitations, PND, orthopnea, dizziness, syncope, or early satiety.  Home Medications    Current Outpatient Medications  Medication Sig Dispense Refill   acetaminophen (TYLENOL) 325 MG tablet Take 650 mg by mouth every 6 (six) hours as needed for mild pain or moderate pain.     apixaban (ELIQUIS) 5 MG TABS tablet Take 1 tablet by mouth twice daily 60 tablet 5   ascorbic acid (VITAMIN C) 500 MG tablet Take 500 mg by mouth daily.     atorvastatin (LIPITOR) 40 MG tablet Take 1 tablet (40 mg total) by mouth every evening. 90 tablet 3   Calcium Carbonate-Vitamin D3 600-400 MG-UNIT TABS Take 1 tablet by mouth daily.     cephALEXin (KEFLEX) 500 MG capsule Take 500 mg by mouth 2 (two) times daily.  folic acid (FOLVITE) A999333 MCG tablet Take 400 mcg by mouth daily.     furosemide (LASIX) 20 MG tablet Take 1 tablet by mouth once daily 90 tablet 3   glipiZIDE (GLUCOTROL XL) 10 MG 24 hr tablet Take 10 mg by mouth daily.     losartan (COZAAR) 100 MG tablet Take 0.5 tablets (50 mg total) by mouth daily. (Patient taking differently: Take 100 mg by mouth daily.) 45 tablet 3   magnesium oxide (MAG-OX) 400 MG tablet Take 400 mg by mouth 2 (two) times daily.     metFORMIN  (GLUCOPHAGE-XR) 500 MG 24 hr tablet Take 1,500 mg by mouth daily with supper.     metoprolol tartrate (LOPRESSOR) 50 MG tablet TAKE 1 & 1/2 (ONE & ONE-HALF) TABLETS BY MOUTH TWICE DAILY 270 tablet 0   ondansetron (ZOFRAN) 8 MG tablet Take 8 mg by mouth every 8 (eight) hours as needed for nausea or vomiting.     TOUJEO SOLOSTAR 300 UNIT/ML Solostar Pen Inject 18 Units into the skin at bedtime.     TRULICITY A999333 0000000 SOPN Inject 0.75 mg into the skin every Sunday.     verapamil (CALAN-SR) 120 MG CR tablet Take 1 tablet (120 mg total) by mouth daily. PLEASE SCHEDULE OFFICE VISIT FOR FURTHER REFILLS. THANK YOU! 30 tablet 0   No current facility-administered medications for this visit.     Review of Systems    Recent bout of increased lower extremity swelling and dyspnea exertion that improved with titration of Lasix.  Now, she denies chest pain, dyspnea, palpitations, PND, orthopnea, dizziness, syncope, edema, or early satiety.  All other systems reviewed and are otherwise negative except as noted above.    Physical Exam    VS:  BP (!) 146/82 (BP Location: Left Arm, Patient Position: Sitting, Cuff Size: Normal)    Pulse 76    Ht 5\' 6"  (1.676 m)    Wt 204 lb (92.5 kg)    SpO2 93%    BMI 32.93 kg/m  , BMI Body mass index is 32.93 kg/m.     Vitals:   11/21/21 1355 11/21/21 1811  BP: (!) 146/82 130/80  Pulse: 76   SpO2: 93%     GEN: Well nourished, well developed, in no acute distress. HEENT: normal. Neck: Supple, no JVD, carotid bruits, or masses. Cardiac: Irregularly, irregular, no murmurs, rubs, or gallops. No clubbing, cyanosis, edema.  Radials/PT 2+ and equal bilaterally.  Respiratory:  Respirations regular and unlabored, clear to auscultation bilaterally. GI: Soft, nontender, nondistended, BS + x 4. MS: no deformity or atrophy. Skin: warm and dry, no rash. Neuro:  Strength and sensation are intact. Psych: Normal affect.  Accessory Clinical Findings    ECG personally  reviewed by me today -atrial fibrillation, 76- no acute changes.  Lab Results  Component Value Date   WBC 6.1 07/18/2021   HGB 9.7 (L) 07/18/2021   HCT 30.3 (L) 07/18/2021   MCV 90.2 07/18/2021   PLT 228 07/18/2021   Lab Results  Component Value Date   CREATININE 0.66 09/01/2021   BUN 18 09/01/2021   NA 138 09/01/2021   K 3.9 09/01/2021   CL 102 09/01/2021   CO2 29 09/01/2021   Lab Results  Component Value Date   ALT 22 11/19/2020   AST 26 11/19/2020   ALKPHOS 115 11/19/2020   BILITOT 0.6 11/19/2020     Lab Results  Component Value Date   HGBA1C 6.9 (H) 11/19/2020   Lipid profile from care  everywhere dated November 09, 2020  Total cholesterol 129, triglycerides 82, HDL 45, LDL 68  Assessment & Plan    1.  Chronic heart failure with preserved ejection fraction: Patient is followed closely in heart failure clinic as well as by paramedicine.  Recently required adjustment in diuretic dosing for short course and is now euvolemic and feeling better.  She suspects that she is very salt sensitive and has tried to be very careful with salt at home.  She has chronic, relatively stable dyspnea on exertion.  Heart rate and blood pressure stable today.  Continue beta-blocker, ARB, and Lasix 20 mg daily.  2.  Persistent atrial fibrillation: Patient with a history of paroxysmal A-fib and 1% burden of A-fib on prior event monitoring in 2021.  She is in rate controlled atrial fibrillation today and I note that it ED visit in October, as well as office visit in June 2022, she was also in atrial fibrillation.  She is asymptomatic.  I suspect her A-fib is more persistent at this point.  We discussed indications for rate versus rhythm control.  She is chronically anticoagulated with a CHA2DS2-VASc of 5.  We agreed to continue a rate control strategy with beta-blocker and calcium channel blocker.  3.  Essential hypertension: Blood pressure was initially elevated 146/82 but did come down on repeat.   Continue beta-blocker, ARB, and diuretic therapy.  4.  Hyperlipidemia: LDL of 68 last January.  This is followed by her primary care provider she remains on statin therapy.  5.  Type 2 diabetes mellitus: A1c 6.9 in November 2022.  Management per internal medicine.  6.  Disposition: Follow-up CBC and basic metabolic panel today.  Follow-up in clinic in 6 months or sooner if necessary.  She has follow-up in heart failure clinic in March.  Murray Hodgkins, NP 11/21/2021, 2:15 PM

## 2021-11-23 ENCOUNTER — Other Ambulatory Visit: Payer: Self-pay | Admitting: Cardiovascular Disease

## 2021-11-23 LAB — BASIC METABOLIC PANEL
BUN/Creatinine Ratio: 19 (ref 12–28)
BUN: 15 mg/dL (ref 8–27)
CO2: 21 mmol/L (ref 20–29)
Calcium: 10 mg/dL (ref 8.7–10.3)
Chloride: 102 mmol/L (ref 96–106)
Creatinine, Ser: 0.79 mg/dL (ref 0.57–1.00)
Glucose: 206 mg/dL — ABNORMAL HIGH (ref 70–99)
Potassium: 4.3 mmol/L (ref 3.5–5.2)
Sodium: 139 mmol/L (ref 134–144)
eGFR: 80 mL/min/{1.73_m2} (ref >59)

## 2021-11-23 LAB — CBC
Hematocrit: 31.6 % — ABNORMAL LOW (ref 34.0–46.6)
Hemoglobin: 10.6 g/dL — ABNORMAL LOW (ref 11.1–15.9)
MCH: 29.6 pg (ref 26.6–33.0)
MCHC: 33.5 g/dL (ref 31.5–35.7)
MCV: 88 fL (ref 79–97)
Platelets: 297 10*3/uL (ref 150–450)
RBC: 3.58 x10E6/uL — ABNORMAL LOW (ref 3.77–5.28)
RDW: 13.7 % (ref 11.7–15.4)
WBC: 5.5 10*3/uL (ref 3.4–10.8)

## 2021-12-11 ENCOUNTER — Other Ambulatory Visit (HOSPITAL_COMMUNITY): Payer: Self-pay

## 2021-12-11 ENCOUNTER — Encounter (HOSPITAL_COMMUNITY): Payer: Self-pay

## 2021-12-11 NOTE — Progress Notes (Signed)
Today Karen Murphy says she is doing good.   She states weight is staying within 3 lbs  and her blood pressure and pulse have been good.  She has been very active  She is watching her high sodium foods, baking her foods instead of frying them.  She mostly eats at home instead of eating out.  She denies any problems such as chest pain, headaches, dizziness or increased shortness of breath.  She denies edema in her legs and abdomen is soft.  She has everything for daily living.  She has been to all her appts and seen cardiology recent.  She is aware up coming appts.  Will continue to visit for heart failure, diet and medication management.   Earmon Phoenix [place] [address] EMT-Paramedic [phone]

## 2021-12-19 ENCOUNTER — Other Ambulatory Visit: Payer: Self-pay | Admitting: Family

## 2022-01-02 ENCOUNTER — Ambulatory Visit: Payer: Medicare Other | Admitting: Family

## 2022-01-09 ENCOUNTER — Ambulatory Visit: Payer: Medicare Other | Attending: Family | Admitting: Family

## 2022-01-09 ENCOUNTER — Encounter: Payer: Self-pay | Admitting: Family

## 2022-01-09 ENCOUNTER — Other Ambulatory Visit: Payer: Self-pay

## 2022-01-09 VITALS — BP 139/83 | HR 50 | Resp 18 | Ht 66.0 in | Wt 205.5 lb

## 2022-01-09 DIAGNOSIS — I10 Essential (primary) hypertension: Secondary | ICD-10-CM

## 2022-01-09 DIAGNOSIS — Z8249 Family history of ischemic heart disease and other diseases of the circulatory system: Secondary | ICD-10-CM | POA: Diagnosis not present

## 2022-01-09 DIAGNOSIS — I480 Paroxysmal atrial fibrillation: Secondary | ICD-10-CM | POA: Insufficient documentation

## 2022-01-09 DIAGNOSIS — J90 Pleural effusion, not elsewhere classified: Secondary | ICD-10-CM | POA: Diagnosis not present

## 2022-01-09 DIAGNOSIS — I5032 Chronic diastolic (congestive) heart failure: Secondary | ICD-10-CM | POA: Insufficient documentation

## 2022-01-09 DIAGNOSIS — Z79899 Other long term (current) drug therapy: Secondary | ICD-10-CM | POA: Diagnosis not present

## 2022-01-09 DIAGNOSIS — Z7985 Long-term (current) use of injectable non-insulin antidiabetic drugs: Secondary | ICD-10-CM | POA: Insufficient documentation

## 2022-01-09 DIAGNOSIS — I110 Hypertensive heart disease with heart failure: Secondary | ICD-10-CM | POA: Insufficient documentation

## 2022-01-09 DIAGNOSIS — Z794 Long term (current) use of insulin: Secondary | ICD-10-CM | POA: Diagnosis not present

## 2022-01-09 DIAGNOSIS — E119 Type 2 diabetes mellitus without complications: Secondary | ICD-10-CM | POA: Insufficient documentation

## 2022-01-09 DIAGNOSIS — M7918 Myalgia, other site: Secondary | ICD-10-CM | POA: Insufficient documentation

## 2022-01-09 DIAGNOSIS — D649 Anemia, unspecified: Secondary | ICD-10-CM | POA: Diagnosis not present

## 2022-01-09 DIAGNOSIS — Z7984 Long term (current) use of oral hypoglycemic drugs: Secondary | ICD-10-CM | POA: Diagnosis not present

## 2022-01-09 DIAGNOSIS — Z87891 Personal history of nicotine dependence: Secondary | ICD-10-CM | POA: Diagnosis not present

## 2022-01-09 MED ORDER — LOSARTAN POTASSIUM 100 MG PO TABS: 100.0000 mg | ORAL_TABLET | Freq: Every day | ORAL | 3 refills | Status: DC

## 2022-01-09 NOTE — Progress Notes (Signed)
? [name], female    DOB: [birth year], 71 y.o.   MRN: [id number] ? ?HPI ? ?Karen Murphy is a 71 y/o female with a history of DM, HTN, anemia, atrial fibrillation, previous tobacco use and chronic heart failure.  ? ?Echo report from 07/25/20 reviewed and showed an EF of 60-65% along with mild LAE. ? ?Was in the ED 07/18/21 due to shortness of breath along with 6 pound weight gain over 2 days. Dose of IV lasix given for pleural effusions and she was released.  ? ?She presents today for a follow-up visit with a chief complaint of minimal shortness of breath upon moderate exertion and occasional dizziness. She says that this has been present for several  years. She denies any fatigue, difficulty sleeping, abdominal distention, palpitations, pedal edema, chest pain, cough or weight gain  ? ?Overall says that she feels "great" and is trying to exercise more.  ? ?Past Medical History:  ?Diagnosis Date  ? Anemia   ? Arthritis   ? Chronic heart failure with preserved ejection fraction (HFpEF) ([place])   ? a. 07/2020 Echo: EF 60-65%, no rwma, nl RV fxn, mildly dil LA, mild-mod AoV sclerosis.  ? Diabetes mellitus without complication ([place])   ? Hypertension   ? PAF (paroxysmal atrial fibrillation) ([place])   ? a. CHA2DS2VASc = 5-->eliquis; b. 08/2020 Zio: Predominantly sinus rhythm 6 bpm.  1% burden of PAF (longest 2 hours and 38 minutes at 121).  ? ?No past surgical history on file. ?Family History  ?Problem Relation Age of Onset  ? Hypertension Mother   ? Hypertension Father   ? ?Social History  ? ?Tobacco Use  ? Smoking status: Former  ? Smokeless tobacco: Never  ?Substance Use Topics  ? Alcohol use: Not on file  ? ?Allergies  ?Allergen Reactions  ? Diltiazem Rash  ? ?Prior to Admission medications   ?Medication Sig Start Date End Date Taking? Authorizing Provider  ?acetaminophen (TYLENOL) 325 MG tablet Take 650 mg by mouth every 6 (six) hours as needed for mild pain or moderate pain.   Yes [provider]   ?apixaban (ELIQUIS) 5 MG TABS tablet Take 1 tablet by mouth twice daily 09/28/21  Yes Gollan, Kathlene November, MD  ?ascorbic acid (VITAMIN C) 500 MG tablet Take 500 mg by mouth daily.   Yes [provider]  ?atorvastatin (LIPITOR) 40 MG tablet Take 1 tablet (40 mg total) by mouth every evening. 09/14/20  Yes Minna Merritts, MD  ?Calcium Carbonate-Vitamin D3 600-400 MG-UNIT TABS Take 1 tablet by mouth daily.   Yes [provider]  ?cephALEXin (KEFLEX) 500 MG capsule Take 500 mg by mouth 2 (two) times daily.   Yes [provider]  ?folic acid (FOLVITE) A999333 MCG tablet Take 400 mcg by mouth daily.   Yes [provider]  ?furosemide (LASIX) 20 MG tablet Take 1 tablet by mouth once daily 10/17/21  Yes Alisa Graff, FNP  ?glipiZIDE (GLUCOTROL XL) 10 MG 24 hr tablet Take 10 mg by mouth daily. 02/03/20  Yes [provider]  ?magnesium oxide (MAG-OX) 400 MG tablet Take 400 mg by mouth 2 (two) times daily.   Yes [provider]  ?metFORMIN (GLUCOPHAGE-XR) 500 MG 24 hr tablet Take 1,500 mg by mouth daily with supper. 10/19/19  Yes [provider]  ?metoprolol tartrate (LOPRESSOR) 50 MG tablet TAKE 1 & 1/2 (ONE & ONE-HALF) TABLETS BY MOUTH TWICE DAILY 11/13/21  Yes Gollan, Kathlene November, MD  ?ondansetron [facility])  8 MG tablet Take 8 mg by mouth every 8 (eight) hours as needed for nausea or vomiting.   Yes [provider]  ?TOUJEO SOLOSTAR 300 UNIT/ML Solostar Pen Inject 18 Units into the skin at bedtime.   Yes [provider]  ?TRULICITY A999333 0000000 SOPN Inject 0.75 mg into the skin every Sunday.   Yes [provider]  ?verapamil (CALAN-SR) 120 MG CR tablet TAKE 1 TABLET BY MOUTH ONCE DAILY PLEASE SCHEDULE OFFICE VISIT FOR FURTHER REFILLS 11/24/21  Yes Minna Merritts, MD  ?losartan (COZAAR) 100 MG tablet Take 1 tablet (100 mg total) by mouth daily. 01/09/22   Alisa Graff, FNP  ? ? ?Review of Systems  ?Constitutional:  Negative for appetite  change and fatigue ("much better").  ?HENT:  Negative for congestion, postnasal drip and sore throat.   ?Eyes: Negative.   ?Respiratory:  Positive for shortness of breath (minimal). Negative for cough and chest tightness.   ?Cardiovascular:  Negative for chest pain, palpitations and leg swelling.  ?Gastrointestinal:  Negative for abdominal distention and abdominal pain.  ?Endocrine: Negative.   ?Genitourinary: Negative.   ?Musculoskeletal:  Positive for myalgias (both upper arms at times). Negative for back pain and neck pain.  ?Skin: Negative.   ?Allergic/Immunologic: Negative.   ?Neurological:  Negative for dizziness and light-headedness.  ?Hematological:  Negative for adenopathy. Does not bruise/bleed easily.  ?Psychiatric/Behavioral:  Negative for dysphoric mood and sleep disturbance (sleeping well on wedge pillow). The patient is not nervous/anxious.   ? ?Vitals:  ? 01/09/22 1304  ?BP: 139/83  ?Pulse: (!) 50  ?Resp: 18  ?SpO2: 99%  ?Weight: 205 lb 8 oz (93.2 kg)  ?Height: 5\' 6"  (1.676 m)  ? ?Wt Readings from Last 3 Encounters:  ?01/09/22 205 lb 8 oz (93.2 kg)  ?12/11/21 198 lb (89.8 kg)  ?11/21/21 204 lb (92.5 kg)  ? ? ?Lab Results  ?Component Value Date  ? CREATININE 0.79 11/21/2021  ? CREATININE 0.66 09/01/2021  ? CREATININE 0.87 07/18/2021  ? ? ?Physical Exam ?Vitals and nursing note reviewed.  ?Constitutional:   ?   Appearance: Normal appearance.  ?HENT:  ?   Head: Normocephalic and atraumatic.  ?Cardiovascular:  ?   Rate and Rhythm: Regular rhythm. Bradycardia present.  ?Pulmonary:  ?   Effort: Pulmonary effort is normal. No respiratory distress.  ?   Breath sounds: No wheezing or rales.  ?Abdominal:  ?   General: There is no distension.  ?   Palpations: Abdomen is soft.  ?Musculoskeletal:     ?   General: No tenderness.  ?   Cervical back: Normal range of motion and neck supple.  ?   Right lower leg: No edema.  ?   Left lower leg: No edema.  ?Skin: ?   General: Skin is warm and dry.  ?Neurological:  ?    General: No focal deficit present.  ?   Mental Status: She is alert and oriented to person, place, and time.  ?Psychiatric:     ?   Mood and Affect: Mood normal.     ?   Behavior: Behavior normal.     ?   Thought Content: Thought content normal.  ? ?Assessment & Plan: ? ?1: Chronic heart failure with preserved ejection fraction with structural changes (LAE)- ?- NYHA class II ?- euvolemic today ?- weighing daily; reminded to call for an overnight weight gain of > 2 pounds or a weekly weight gain of > 5 pounds ?- weight 205  today, up from 201 at last visit, not HF related ?- not adding salt and has been diligent about reading food labels so that she can follow a low sodium diet  ?- saw cardiology Sharolyn Douglas) 11/21/21; returns 05/18/22 ?- consider changing losartan to entresto but would need patient assistance ?- BNP 07/18/21 was 289.7 ?- participating in paramedicine program  ? ?2: HTN- ?- BP 139/83 ?- saw PCP (Lavins) 08/18/21 ?- BMP 11/21/21 reviewed and showed sodium 139, potassium 4.3, creatinine 0.79 and GFR 80 ? ?3: Type 2 DM- ?- A1c 08/18/21 was 6.9% ?- non-fasting glucose at home today was 147 ?- on truliticy, toujeo, glipizide, metformin ? ?4: Paroxysmal Atrial fibrillation- ?- previously wore Zio monitor November 2021 ?- currently rate controlled on verapamil and metoprolol tartrate ?- assisted with eliquis patient assistance in the past, states it cost her 150$/month and she had to charge it ?- encouraged her to f/u with cardiology to see what her options are ? ? ?Medication bottles reviewed.  ? ?Return in 4 months or sooner for any questions/problems before then.  ? ? ? ? ? ?

## 2022-01-09 NOTE — Patient Instructions (Signed)
Call cardiology office and let them know you are having trouble affording your eliquis. ? ?You are doing great with your heart failure.  ? ?Continue to watch your salt intake, adhere to ~64 ounces/day of total fluid.  ? ?Continue to weigh daily.  ? ?Return in 4 months.  ?

## 2022-01-10 ENCOUNTER — Ambulatory Visit: Payer: Medicare Other | Admitting: Family

## 2022-01-11 ENCOUNTER — Other Ambulatory Visit (HOSPITAL_COMMUNITY): Payer: Self-pay

## 2022-01-11 NOTE — Progress Notes (Signed)
Today had a phone visit with Karen Murphy.  She has been doing well.  Just seen at HF clinic.  She states watching her diet real closely.  She measures her fluids and eating low sodium foods.  She states has all her energy back plus more.  She is out doing a lot of things now.  She sounds good.  She denies any problems such as chest pain, headache, dizziness or increased shortness of breath.  She has all her medications and aware of how to take them.  She has everything for daily living.  Will continue to visit for heart failure, diet and medication management.  ? ?Kurt Azimi ?[place] EMT-Paramedic ?[phone] ?

## 2022-01-15 ENCOUNTER — Telehealth: Payer: Self-pay | Admitting: Family

## 2022-01-15 NOTE — Telephone Encounter (Signed)
Patient called to say that since she started taking the new RX for her losartan 100mg , she has developed some itching & rash. She says that when she wakes up in the morning, the rash is gone but then she takes her medications and the rash/ itching re-occurs and then resolves again overnight.  ? ?She's been taking 100mg  losartan but she says that this pill is white in color and normally she has taken a green colored white. She thinks she has taken the name brand cozaar in the past.  ? ?Says that the rash is a bunch of bumps sporadically on her stomach and arms with itching of her legs. Nothing on her chest or throat and no problem swallowing/ breathing. No new detergents etc and no other new medications that she has started.  ? ?Discussed her continuing to try this and see how she does over the next couple of days. If the symptoms continue, we can switch to name brand cozaar. If the rash spreads, she needs to stop the generic losartan immediately. Patient was comfortable with this plan and will call us back in 2 days to update.  ?

## 2022-01-17 ENCOUNTER — Telehealth: Payer: Self-pay | Admitting: Family

## 2022-01-17 MED ORDER — COZAAR 100 MG PO TABS: 100.0000 mg | ORAL_TABLET | Freq: Every day | ORAL | 5 refills | Status: DC

## 2022-01-17 NOTE — Telephone Encounter (Signed)
Patient called back to say that the rash and itching have continued and she would like to try the name brand Cozaar and see if that helps.  ? ?Will send in RX for this.  ?

## 2022-02-06 ENCOUNTER — Encounter (HOSPITAL_COMMUNITY): Payer: Self-pay

## 2022-02-06 ENCOUNTER — Other Ambulatory Visit (HOSPITAL_COMMUNITY): Payer: Self-pay

## 2022-02-06 NOTE — Progress Notes (Signed)
Today had a home visit with [name].  She states has been doing well.  We discussed birthday dinners, medications and diet.  She is a joy to talk to.  She states her weight fluctuates from 202 to 205 lbs now and she has been doing well with that.  She cooks most of her food.  She stays active in the community or with friends and family.  She lives alone but has a close friend that she visits a lot with.  She has close family.  Her mood is good.  She has everything for daily living.  She is aware of up coming appts.  She watches her high sodium foods and how much fluids she intakes daily. She weighs daily and checks her vitals.  She is aware to call with problems.  She is trying to get her medications more affordable, especially her diabetes medications that are so expensive.  Will continue to visit for heart failure, diet and medication management. ? ?Karen Murphy ?[place] EMT-Paramedic ?[phone] ?

## 2022-03-29 ENCOUNTER — Encounter (HOSPITAL_COMMUNITY): Payer: Self-pay

## 2022-03-29 ENCOUNTER — Other Ambulatory Visit (HOSPITAL_COMMUNITY): Payer: Self-pay

## 2022-03-29 NOTE — Progress Notes (Signed)
Today had a home visit with Karen Murphy.  She states been doing well.  She states first time in 2 years she has planed flowers outside.  She can get up in the morning and take her meds and eat breakfast without taking a rest.  She states not sure about traveling anymore, she staes not up to that.  She stays busy at home and visits people often.  She does her own grocery shopping.  She stays busy with a friend that lives in [place] and does several things with him.  She has family close by.  She has good neighbors.  She has all her medications and she is able to afford them.  She is aware of up coming appts.  She is always a joy to visit.  She has no edema in lower legs.  She checks her blood pressure and weighs daily.  She states her blood sugar has been good in the mornings. She has everything for daily living.  Lungs are clear and abdomen is soft.  She has no complaints such as chest pain, headaches, dizziness or increased shortness of breath.  Will continue to visit for heart failure, diet and medication compliance.  She is aware to call if any problems or increase in weight.   Earmon Phoenix [place] EMT-Paramedic [phone]

## 2022-04-23 ENCOUNTER — Other Ambulatory Visit (HOSPITAL_COMMUNITY): Payer: Self-pay

## 2022-04-23 ENCOUNTER — Other Ambulatory Visit: Payer: Self-pay | Admitting: Family

## 2022-04-23 MED ORDER — VERAPAMIL HCL ER 120 MG PO TBCR: 120.0000 mg | EXTENDED_RELEASE_TABLET | Freq: Every day | ORAL | 0 refills | Status: AC

## 2022-04-25 NOTE — Progress Notes (Signed)
Bryan contacted me stating she was running out her digoxin and she did not have appt with cardiology til after. Contacted [name] with HF clinic and she sent in 1 month supply to get her through.  Advised her that she sent it to her pharmacy.  She states will pick it up and was so appreciative.  She states doing well.  She states weight is good and denies chest pain or shortness of breath.  Will continue to visit for heart failure, diet and medication management,   Earmon Phoenix [place] EMT-Paramedic [phone]

## 2022-05-11 ENCOUNTER — Ambulatory Visit: Payer: Medicare Other | Admitting: Family

## 2022-05-17 NOTE — Progress Notes (Signed)
Cardiology Office Note  Date:  05/18/2022   ID:  [name], DOB 23-Dec-1950, MRN [id number]  PCP:  [facility]   Chief Complaint  Patient presents with   6 month follow up     Patient c/o shortness of breath with walking a long distance. Medications reviewed by the patient verbally.     HPI:  Karen Murphy is a 71 y.o. female with a hx of  HTN,  PAF on Eliquis, syncope/orthostasis in the setting of arrhythmia,  HLD, DM 2, previous tobacco use, previous pneumonia, rheumatoid arthritis  Presenting for follow-up of her atrial fibrillation  LOV June 2022 Last seen in our clinic February 2023 At that time had required extra Lasix for weight gain Seen by CHF clinic March 2023  All EKGs have been showing atrial fibrillation Last NSr 2/22  Continued SOB since MAY 2023 Feels lazy, some morning exercise, not on a regular basis Sedentary most of the time, couch/computer Some chair exercises  Echo 10/21 Normal EF, mildly dilated LA  Takes lasix 20 daily in a consistent fashion  A1C 7.1 Total chol 129, LDL  68  EKG personally reviewed by myself on todays visit Atrial fib rate 78 bpm  Records reviewed In the hospital October 2021   paroxysmal atrial fibrillation with RVR and converted to NSR on diltiazem drip.  Eliquis 5 mg twice daily.   syncope  in the setting of standing up during palpitations and likely due to her atrial for with RVR and orthostatic hypotension.     Echo 07/25/20 LVEF 60-65%, no RWIMA, LA mildly dilated, mild to moderate AV sclerosis without stenosis   primary care provider 07/27/2020 she woke up with highly pruritic rash involving multiple areas of her body including stomach, buttocks, torso, arms, legs on 07/27/2020.  Her diltiazem was stopped and she was started on verapamil.  Recent monitor showing paroxysmal atrial fibrillation 1% burden Longest episode 2 hours 38 minutes heart rate 121  PMH:   has a past medical history of Anemia,  Arthritis, Chronic heart failure with preserved ejection fraction (HFpEF) (HCC), Diabetes mellitus without complication (HCC), Hypertension, and PAF (paroxysmal atrial fibrillation) (HCC).  PSH:   No past surgical history on file.  Current Outpatient Medications  Medication Sig Dispense Refill   acetaminophen (TYLENOL) 325 MG tablet Take 650 mg by mouth every 6 (six) hours as needed for mild pain or moderate pain.     apixaban (ELIQUIS) 5 MG TABS tablet Take 1 tablet by mouth twice daily 60 tablet 5   ascorbic acid (VITAMIN C) 500 MG tablet Take 500 mg by mouth daily.     atorvastatin (LIPITOR) 40 MG tablet Take 1 tablet (40 mg total) by mouth every evening. 90 tablet 3   Calcium Carbonate-Vitamin D3 600-400 MG-UNIT TABS Take 1 tablet by mouth daily.     cephALEXin (KEFLEX) 500 MG capsule Take 500 mg by mouth 2 (two) times daily.     COZAAR 100 MG tablet Take 1 tablet (100 mg total) by mouth daily. 30 tablet 5   enalapril (VASOTEC) 20 MG tablet Take 20 mg by mouth daily.     folic acid (FOLVITE) 400 MCG tablet Take 400 mcg by mouth daily.     furosemide (LASIX) 20 MG tablet Take 1 tablet by mouth once daily 90 tablet 3   glipiZIDE (GLUCOTROL XL) 10 MG 24 hr tablet Take 10 mg by mouth daily.     magnesium oxide (MAG-OX) 400 MG tablet Take 400 mg  by mouth 2 (two) times daily.     metFORMIN (GLUCOPHAGE-XR) 500 MG 24 hr tablet Take 1,500 mg by mouth daily with supper.     methotrexate (RHEUMATREX) 2.5 MG tablet Take 2.5 mg by mouth in the morning and at bedtime.     metoprolol tartrate (LOPRESSOR) 50 MG tablet TAKE 1 & 1/2 (ONE & ONE-HALF) TABLETS BY MOUTH TWICE DAILY 270 tablet 0   ondansetron (ZOFRAN) 8 MG tablet Take 8 mg by mouth every 8 (eight) hours as needed for nausea or vomiting.     OZEMPIC, 0.25 OR 0.5 MG/DOSE, 2 MG/3ML SOPN Inject into the skin once a week.     TOUJEO SOLOSTAR 300 UNIT/ML Solostar Pen Inject 18 Units into the skin at bedtime.     TRULICITY A999333 [id number] SOPN Inject  0.75 mg into the skin every Sunday.     verapamil (CALAN-SR) 120 MG CR tablet Take 1 tablet (120 mg total) by mouth daily. 30 tablet 0   No current facility-administered medications for this visit.     Allergies:   Diltiazem   Social History:  The patient  reports that she has quit smoking. She has never used smokeless tobacco.   Family History:   family history includes Hypertension in her father and mother.    Review of Systems: Review of Systems  Constitutional: Negative.   HENT: Negative.    Respiratory: Negative.    Cardiovascular: Negative.   Gastrointestinal: Negative.   Musculoskeletal: Negative.   Neurological: Negative.   Psychiatric/Behavioral: Negative.    All other systems reviewed and are negative.   PHYSICAL EXAM: VS:  BP 116/60 (BP Location: Left Arm, Patient Position: Sitting, Cuff Size: Large)   Pulse 78   Ht 5\' 6"  (1.676 m)   Wt 209 lb 8 oz (95 kg)   BMI 33.81 kg/m  , BMI Body mass index is 33.81 kg/m. Constitutional:  oriented to person, place, and time. No distress.  HENT:  Head: Grossly normal Eyes:  no discharge. No scleral icterus.  Neck: No JVD, no carotid bruits  Cardiovascular: irreg irreg,  no murmurs appreciated Pulmonary/Chest: Clear to auscultation bilaterally, no wheezes or rails Abdominal: Soft.  no distension.  no tenderness.  Musculoskeletal: Normal range of motion Neurological:  normal muscle tone. Coordination normal. No atrophy Skin: Skin warm and dry Psychiatric: normal affect, pleasant  Recent Labs: 07/18/2021: B Natriuretic Peptide 289.7 11/21/2021: BUN 15; Creatinine, Ser 0.79; Hemoglobin 10.6; Platelets 297; Potassium 4.3; Sodium 139    Lipid Panel No results found for: "CHOL", "HDL", "LDLCALC", "TRIG"    Wt Readings from Last 3 Encounters:  05/18/22 209 lb 8 oz (95 kg)  03/29/22 204 lb 9.6 oz (92.8 kg)  02/06/22 203 lb 4.8 oz (92.2 kg)      ASSESSMENT AND PLAN:  Problem List Items Addressed This Visit        Cardiology Problems   Essential hypertension   Relevant Medications   enalapril (VASOTEC) 20 MG tablet   Other Relevant Orders   EKG 12-Lead     Other   Type 2 diabetes mellitus without complication, with long-term current use of insulin (HCC)   Relevant Medications   enalapril (VASOTEC) 20 MG tablet   OZEMPIC, 0.25 OR 0.5 MG/DOSE, 2 MG/3ML SOPN   Other Relevant Orders   EKG 12-Lead   Other Visit Diagnoses     Chronic heart failure with preserved ejection fraction (HFpEF) ([place])    -  Primary   Relevant Medications   enalapril (  VASOTEC) 20 MG tablet   Other Relevant Orders   EKG 12-Lead   Paroxysmal atrial fibrillation (HCC)       Relevant Medications   enalapril (VASOTEC) 20 MG tablet   Other Relevant Orders   EKG 12-Lead   Persistent atrial fibrillation (HCC)       Relevant Medications   enalapril (VASOTEC) 20 MG tablet   Mixed hyperlipidemia       Relevant Medications   enalapril (VASOTEC) 20 MG tablet   Chronic diastolic heart failure (HCC)       Relevant Medications   enalapril (VASOTEC) 20 MG tablet      Atrial fibrillation with RVR In atrial fibrillation on today's visit Prior monitor PAF burden 1%  though given every EKG showing atrial fibrillation, concerned now for permanent atrial fibrillation continue Eliquis, verapamil, metoprolol Discussed various treatment options, she is inclined to pursue rate control not rhythm controlled No changes made to the medications above  Diabetes type 2 Hemoglobin A1c 7.1 We have encouraged continued exercise, careful diet management in an effort to lose weight.  Essential hypertension Continue current medication regiment  Debility Recommended walking program, weight loss Currently sedentary   Total encounter time more than 30 minutes  Greater than 50% was spent in counseling and coordination of care with the patient    Signed, Dossie Arbour, M.D., Ph.D. [facility] [place],  [place] [phone]

## 2022-05-18 ENCOUNTER — Ambulatory Visit (INDEPENDENT_AMBULATORY_CARE_PROVIDER_SITE_OTHER): Payer: Medicare Other | Admitting: Cardiovascular Disease

## 2022-05-18 ENCOUNTER — Encounter: Payer: Self-pay | Admitting: Cardiovascular Disease

## 2022-05-18 VITALS — BP 116/60 | HR 78 | Ht 66.0 in | Wt 209.5 lb

## 2022-05-18 DIAGNOSIS — E119 Type 2 diabetes mellitus without complications: Secondary | ICD-10-CM

## 2022-05-18 DIAGNOSIS — E782 Mixed hyperlipidemia: Secondary | ICD-10-CM

## 2022-05-18 DIAGNOSIS — I5032 Chronic diastolic (congestive) heart failure: Secondary | ICD-10-CM | POA: Diagnosis not present

## 2022-05-18 DIAGNOSIS — I4819 Other persistent atrial fibrillation: Secondary | ICD-10-CM

## 2022-05-18 DIAGNOSIS — I10 Essential (primary) hypertension: Secondary | ICD-10-CM | POA: Diagnosis not present

## 2022-05-18 DIAGNOSIS — Z794 Long term (current) use of insulin: Secondary | ICD-10-CM

## 2022-05-18 DIAGNOSIS — I480 Paroxysmal atrial fibrillation: Secondary | ICD-10-CM | POA: Diagnosis not present

## 2022-05-18 NOTE — Patient Instructions (Signed)
Medication Instructions:  No changes  If you need a refill on your cardiac medications before your next appointment, please call your pharmacy.   Lab work: No new labs needed  Testing/Procedures: No new testing needed  Follow-Up: At [facility], you and your health needs are our priority.  As part of our continuing mission to provide you with exceptional heart care, we have created designated Provider Care Teams.  These Care Teams include your primary Cardiologist (physician) and Advanced Practice Providers (APPs -  Physician Assistants and Nurse Practitioners) who all work together to provide you with the care you need, when you need it.  You will need a follow up appointment in 12 months  Providers on your designated Care Team:   Christopher Berge, NP Ryan Dunn, PA-C Cadence Furth, PA-C  COVID-19 Vaccine Information can be found at: [email or web address] For questions related to vaccine distribution or appointments, please email [email or web address] or call [phone].   

## 2022-05-22 ENCOUNTER — Other Ambulatory Visit: Payer: Self-pay | Admitting: Family

## 2022-06-03 NOTE — Progress Notes (Unsigned)
[name], female    DOB: 1951/07/30, 71 y.o.   MRN: [id number]  HPI  Ms Brandis is a 71 y/o female with a history of DM, HTN, anemia, atrial fibrillation, previous tobacco use and chronic heart failure.   Echo report from 07/25/20 reviewed and showed an EF of 60-65% along with mild LAE.  Has not been admitted or been in the ED in the last 6 months.   She presents today for a follow-up visit. She is concerned about her shortness of breath but contributes it to the heat. She said she doesn't have this problem inside the house only when she goes out in the heat. Denies appetite changes, fatigue, headaches, dizziness, tremors,chest pain/pressure, abdominal pain. Does suffer from loose bowels occasionally. Does take occasional stool softeners for constipation. 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 Denies swelling in lower extremities and checks feet nightly after shower as she has DM type 2. Denies current tobacco use nor alcohol. Once in a while she said she can feel her heart fluttering. She monitors her blood sugars daily and weights and monitors blood pressure every other day.   Past Medical History:  Diagnosis Date   Anemia    Arthritis    Chronic heart failure with preserved ejection fraction (HFpEF) ([place])    a. 07/2020 Echo: EF 60-65%, no rwma, nl RV fxn, mildly dil LA, mild-mod AoV sclerosis.   Diabetes mellitus without complication ([place])    Hypertension    PAF (paroxysmal atrial fibrillation) ([place])    a. CHA2DS2VASc = 5-->eliquis; b. 08/2020 Zio: Predominantly sinus rhythm 6 bpm.  1% burden of PAF (longest 2 hours and 38 minutes at 121).   No past surgical history on file. Family History  Problem Relation Age of Onset   Hypertension Mother    Hypertension Father    Social History   Tobacco Use   Smoking status: Former   Smokeless tobacco: Never  Substance Use Topics   Alcohol use: Not on file   Allergies  Allergen Reactions   Diltiazem Rash   Prior to  Admission medications   Medication Sig Start Date End Date Taking? Authorizing Provider  acetaminophen (TYLENOL) 325 MG tablet Take 650 mg by mouth every 6 (six) hours as needed for mild pain or moderate pain.   Yes [provider]  apixaban (ELIQUIS) 5 MG TABS tablet Take 1 tablet by mouth twice daily 09/28/21  Yes Gollan, Kathlene November, MD  ascorbic acid (VITAMIN C) 500 MG tablet Take 500 mg by mouth daily.   Yes [provider]  atorvastatin (LIPITOR) 40 MG tablet Take 1 tablet (40 mg total) by mouth every evening. 09/14/20  Yes Minna Merritts, MD  Calcium Carbonate-Vitamin D3 600-400 MG-UNIT TABS Take 1 tablet by mouth daily.   Yes [provider]  cephALEXin (KEFLEX) 500 MG capsule Take 500 mg by mouth 2 (two) times daily.   Yes [provider]  COZAAR 100 MG tablet Take 1 tablet (100 mg total) by mouth daily. 01/17/22  Yes Hue Steveson, Otila Kluver A, FNP  folic acid (FOLVITE) A999333 MCG tablet Take 400 mcg by mouth daily.   Yes [provider]  furosemide (LASIX) 20 MG tablet Take 1 tablet by mouth once daily 10/17/21  Yes Ryson Bacha A, FNP  glipiZIDE (GLUCOTROL XL) 10 MG 24 hr tablet Take 10 mg by mouth daily. 02/03/20  Yes [provider]  magnesium oxide (MAG-OX) 400 MG tablet Take 250 mg by mouth 2 (  two) times daily.   Yes [provider]  metFORMIN (GLUCOPHAGE-XR) 500 MG 24 hr tablet Take 1,500 mg by mouth daily with supper. 10/19/19  Yes [provider]  metoprolol tartrate (LOPRESSOR) 50 MG tablet TAKE 1 & 1/2 (ONE & ONE-HALF) TABLETS BY MOUTH TWICE DAILY Patient taking differently: Take 50 mg by mouth 2 (two) times daily. Take one tablet twice a day 11/13/21  Yes Gollan, Tollie Pizza, MD  ondansetron (ZOFRAN) 8 MG tablet Take 8 mg by mouth every 8 (eight) hours as needed for nausea or vomiting.   Yes [provider]  OZEMPIC, 0.25 OR 0.5 MG/DOSE, 2 MG/3ML SOPN Inject into the skin once a week. 01/26/22  Yes [provider]  TOUJEO SOLOSTAR 300 UNIT/ML Solostar Pen Inject 18 Units into the skin at bedtime.   Yes [provider]  TRULICITY 0.75 MG/0.5ML SOPN Inject 0.75 mg into the skin every Sunday.   Yes [provider]  verapamil (CALAN-SR) 120 MG CR tablet Take 1 tablet (120 mg total) by mouth daily. 04/23/22  Yes Cheria Sadiq A, FNP  enalapril (VASOTEC) 20 MG tablet Take 20 mg by mouth daily. Patient not taking: Reported on 06/04/2022    [provider]  methotrexate (RHEUMATREX) 2.5 MG tablet Take 2.5 mg by mouth in the morning and at bedtime. Patient not taking: Reported on 06/04/2022 07/15/18   [provider]     Review of Systems  Constitutional:  Negative for appetite change and fatigue.  HENT:  Negative for congestion, postnasal drip and sore throat.   Eyes: Negative.   Respiratory:  Positive for shortness of breath (minimal). Negative for cough and chest tightness.   Cardiovascular:  Negative for chest pain, palpitations and leg swelling.  Gastrointestinal:  Negative for abdominal distention and abdominal pain.  Endocrine: Negative.   Genitourinary: Negative.   Musculoskeletal:  Positive for myalgias (both upper arms at times). Negative for back pain and neck pain.  Skin: Negative.   Allergic/Immunologic: Negative.   Neurological:  Negative for dizziness and light-headedness.  Hematological:  Negative for adenopathy. Does not bruise/bleed easily.  Psychiatric/Behavioral:  Negative for dysphoric mood and sleep disturbance (sleeping well on wedge pillow). The patient is not nervous/anxious.    Vitals:   06/04/22 1356  BP: 137/65  Pulse: 75  Resp: 14  SpO2: 100%  Weight: 210 lb 4 oz (95.4 kg)  Height: 5\' 6"  (1.676 m)   Wt Readings from Last 3 Encounters:  06/04/22 210 lb 4 oz (95.4 kg)  05/18/22 209 lb 8 oz (95 kg)  03/29/22 204 lb 9.6 oz (92.8 kg)   Lab Results  Component Value Date   CREATININE 0.79 11/21/2021   CREATININE 0.66 09/01/2021    CREATININE 0.87 07/18/2021   Physical Exam Vitals and nursing note reviewed.  Constitutional:      Appearance: Normal appearance.  HENT:     Head: Normocephalic and atraumatic.  Cardiovascular:     Rate and Rhythm: Normal rate and regular rhythm.  Pulmonary:     Effort: Pulmonary effort is normal. No respiratory distress.     Breath sounds: No wheezing or rales.  Abdominal:     General: There is no distension.     Palpations: Abdomen is soft.  Musculoskeletal:        General: No tenderness.     Cervical back: Normal range of motion and neck supple.     Right lower leg: No edema.     Left lower leg: No  edema.  Skin:    General: Skin is warm and dry.  Neurological:     General: No focal deficit present.     Mental Status: She is alert and oriented to person, place, and time.  Psychiatric:        Mood and Affect: Mood normal.        Behavior: Behavior normal.        Thought Content: Thought content normal.    Assessment & Plan:  1: Chronic heart failure with preserved ejection fraction with structural changes (LAE)- - NYHA class II - euvolemic today - weighing daily; reminded to call for an overnight weight gain of > 2 pounds or a weekly weight gain of > 5 pounds - weight up 5 lbs 210.4 since last visit 01/09/22 - not adding salt and has been diligent about reading food labels so that she can follow a low sodium diet  - saw cardiology Mariah Milling) 05/18/22 - consider changing losartan to entresto but would need patient assistance - discuss updating echo at next visit - BNP 07/18/21 was 289.7 - participating in paramedicine program   2: HTN- - BP (137/65) - saw PCP (Lavins) 02/26/22 - BMP 11/21/21 reviewed and showed sodium 139, potassium 4.3, creatinine 0.79 and GFR 80  3: Type 2 DM- - A1c 02/26/22 was 7.1% - non-fasting glucose at home today was 135 - on truliticy, toujeo, glipizide, metformin  4: Paroxysmal Atrial fibrillation- - previously wore Zio monitor November  2021 - currently rate controlled on verapamil and metoprolol tartrate - currently on eliquis    Medication bottles reviewed.   Follow-up 3 months

## 2022-06-04 ENCOUNTER — Ambulatory Visit: Payer: Medicare Other | Attending: Family | Admitting: Family

## 2022-06-04 ENCOUNTER — Encounter: Payer: Self-pay | Admitting: Family

## 2022-06-04 VITALS — BP 137/65 | HR 75 | Resp 14 | Ht 66.0 in | Wt 210.2 lb

## 2022-06-04 DIAGNOSIS — E119 Type 2 diabetes mellitus without complications: Secondary | ICD-10-CM | POA: Diagnosis not present

## 2022-06-04 DIAGNOSIS — Z7984 Long term (current) use of oral hypoglycemic drugs: Secondary | ICD-10-CM | POA: Insufficient documentation

## 2022-06-04 DIAGNOSIS — Z7901 Long term (current) use of anticoagulants: Secondary | ICD-10-CM | POA: Insufficient documentation

## 2022-06-04 DIAGNOSIS — D649 Anemia, unspecified: Secondary | ICD-10-CM | POA: Diagnosis not present

## 2022-06-04 DIAGNOSIS — Z794 Long term (current) use of insulin: Secondary | ICD-10-CM

## 2022-06-04 DIAGNOSIS — I110 Hypertensive heart disease with heart failure: Secondary | ICD-10-CM | POA: Diagnosis present

## 2022-06-04 DIAGNOSIS — I480 Paroxysmal atrial fibrillation: Secondary | ICD-10-CM | POA: Diagnosis not present

## 2022-06-04 DIAGNOSIS — I10 Essential (primary) hypertension: Secondary | ICD-10-CM | POA: Diagnosis not present

## 2022-06-04 DIAGNOSIS — I5032 Chronic diastolic (congestive) heart failure: Secondary | ICD-10-CM | POA: Diagnosis present

## 2022-06-04 NOTE — Patient Instructions (Addendum)
Continue weighing daily and call for an overnight weight gain of 3 pounds or more or a weekly weight gain of more than 5 pounds.  °

## 2022-06-05 ENCOUNTER — Other Ambulatory Visit (HOSPITAL_COMMUNITY): Payer: Self-pay

## 2022-06-05 NOTE — Progress Notes (Signed)
Kayle says she has been doing good.  She states she has slipped lately but getting back on track.  She states been having some shortness of breath.  We discussed her weight and where she appears to be good with her breathing.  She states she has started eating bread and more sweets lately.  She noticed her weight was going up but her sugars has been good.  She states cardiology advised her she could take up to 3 mg lasix as needed.  She is measuring her fluids.  She is watching her diet better.  She states she is starting to feel better not eating the bread and sugars.  She has all her medications and knows how to take them.  She is able to fix her own med boxes.  She is aware of up coming appts.  She has recently seen cardiology and HF clinic.  Will continue to visit for heart failure, diet and medication compliance.   Earmon Phoenix [place] EMT-Paramedic [phone]

## 2022-06-19 IMAGING — CR DG CHEST 2V
2 series · 2 of 2 positions shown · non-contrast
Comparison: 07/24/2020

CLINICAL DATA: Shortness of breath.

EXAM:
CHEST - 2 VIEW

[chest lat]
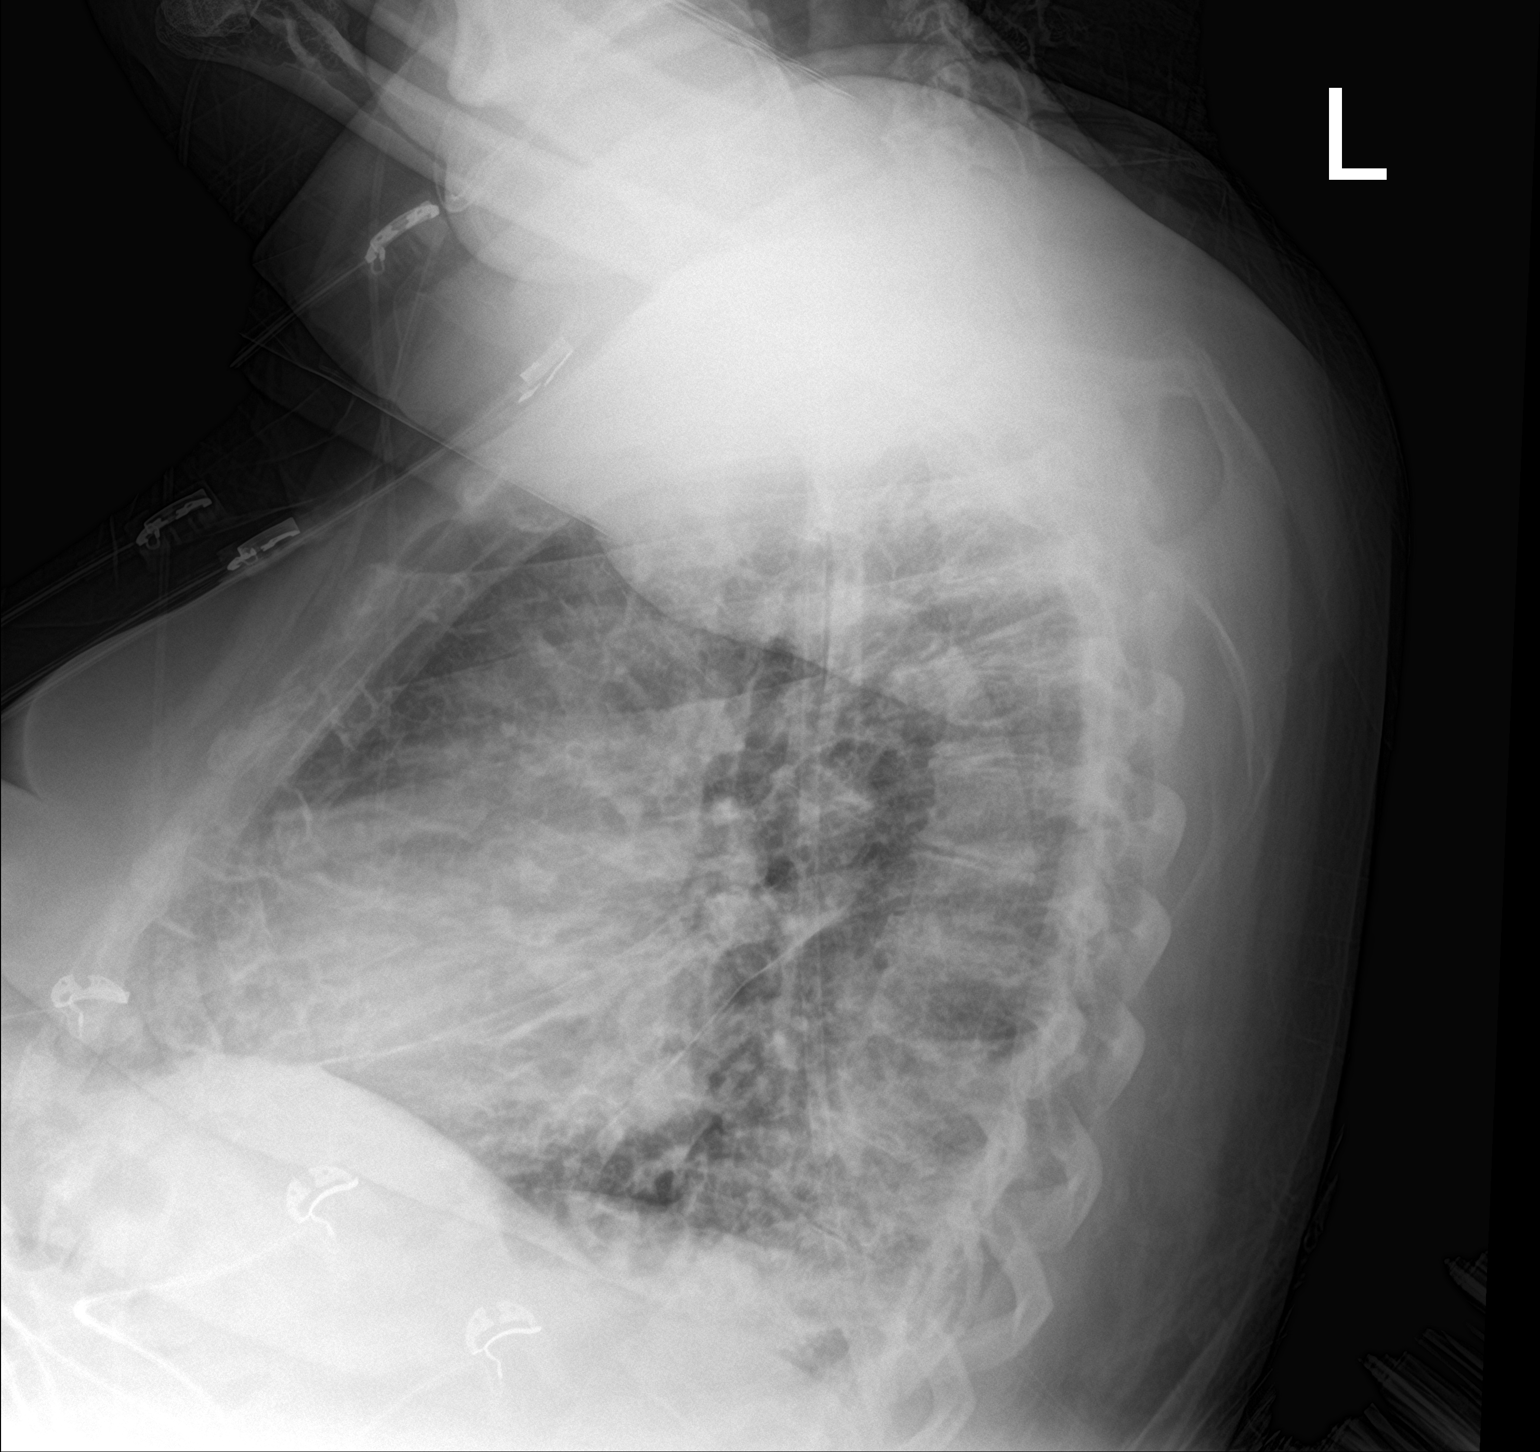

[chest ap]
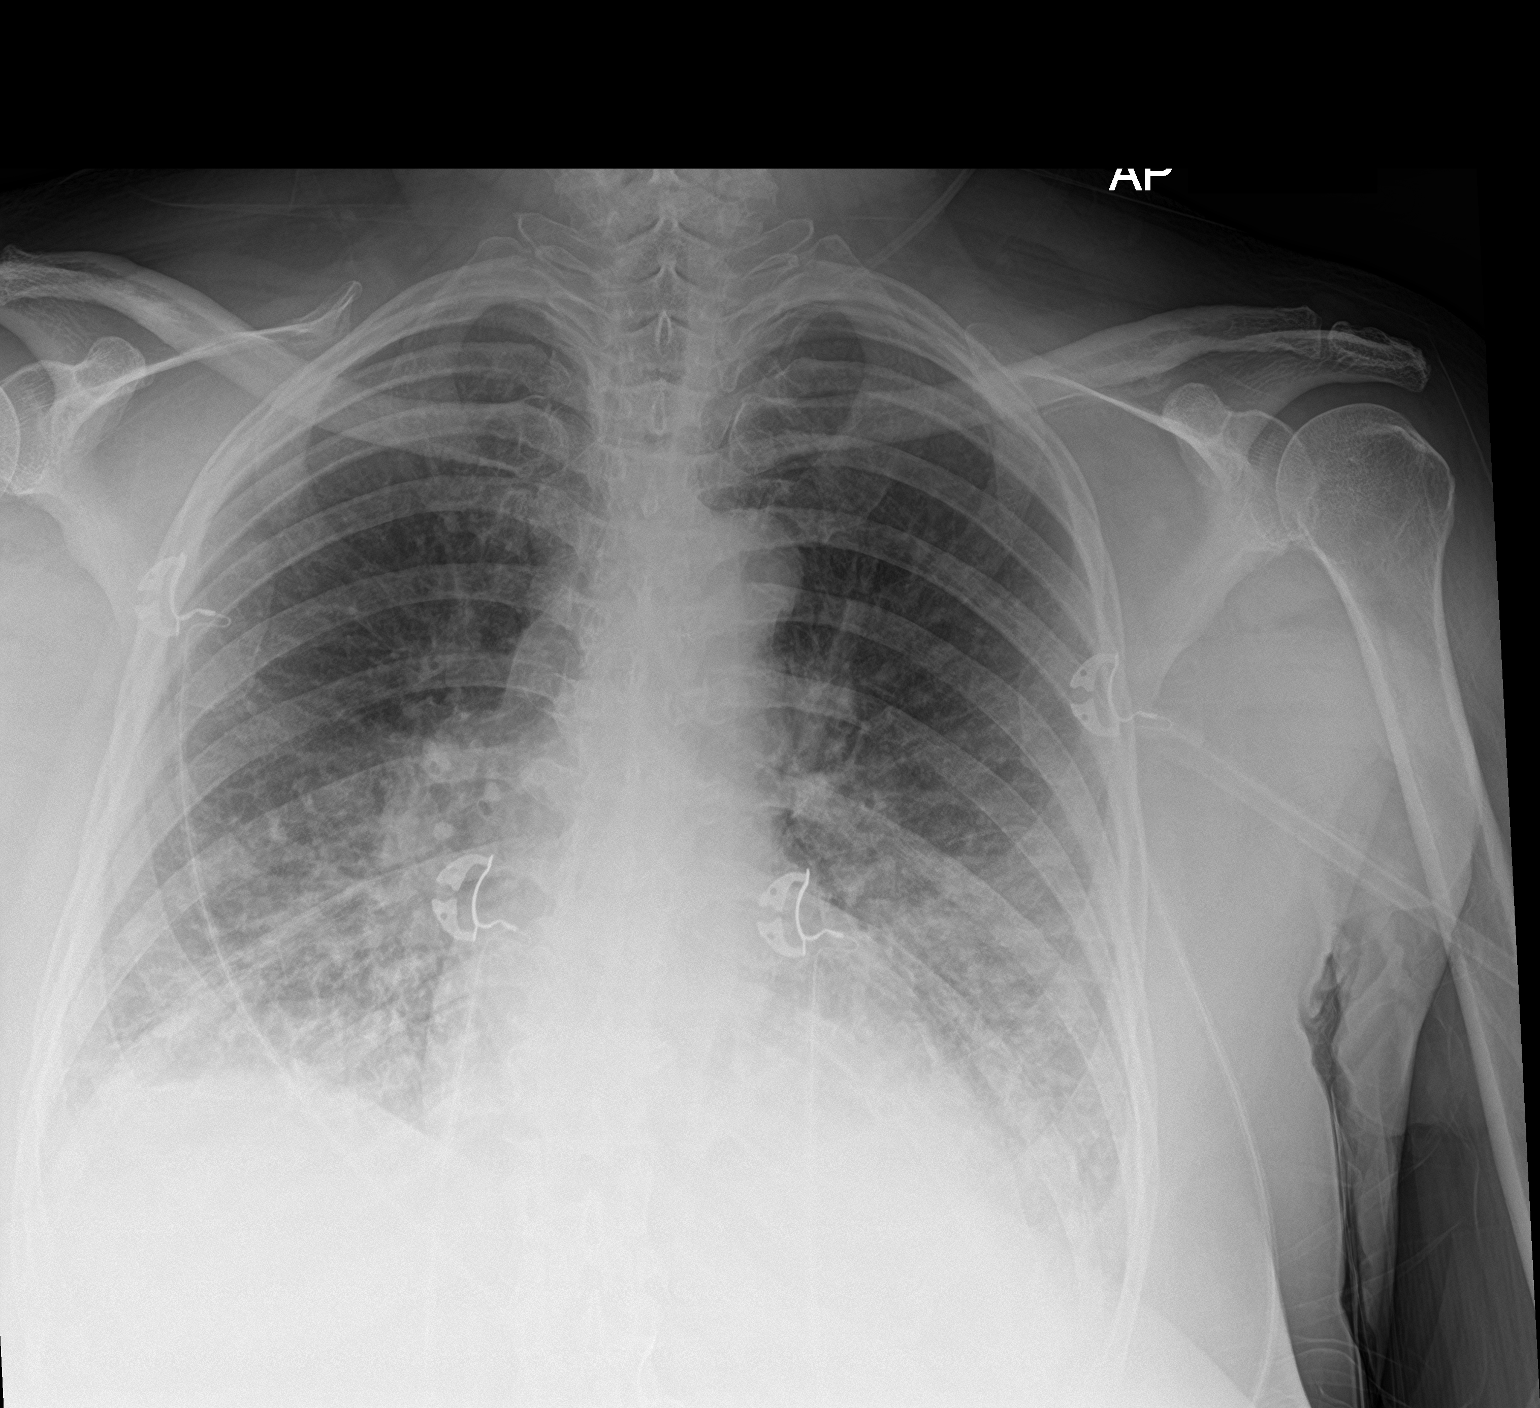

[2 of 2 positions shown; findings below may reference images not displayed]

FINDINGS: Symmetric interstitial and airspace opacity with basilar
predilection. Normal heart size for portable technique. No definite
effusion. No air leak.
IMPRESSION: Symmetric pulmonary opacity favoring CHF.

## 2022-07-13 ENCOUNTER — Telehealth (HOSPITAL_COMMUNITY): Payer: Self-pay

## 2022-07-13 NOTE — Telephone Encounter (Signed)
Attempted to contact but no answer. Left message.   Nile Dear Dunn [place] [phone]

## 2022-08-13 ENCOUNTER — Other Ambulatory Visit (HOSPITAL_COMMUNITY): Payer: Self-pay

## 2022-08-13 NOTE — Progress Notes (Signed)
Had left a message for Karen Murphy a week ago about a home visit today.  She appears to be not at home and no answer from her phone.  Left message, will continue to try to contact.   [place] [phone]

## 2022-08-22 ENCOUNTER — Other Ambulatory Visit (HOSPITAL_COMMUNITY): Payer: Self-pay

## 2022-08-23 ENCOUNTER — Encounter (HOSPITAL_COMMUNITY): Payer: Self-pay

## 2022-08-23 NOTE — Progress Notes (Signed)
Today had a home visit with Karen Murphy.  She states been doing well.  She stays active outside the home and inside.  She does her own grocery shopping and travels to [place] to see her friend without troubles.  She states she has gain some but not fluid, she states she has been eating things not good for her.  Weight has increased over period of time not overnight. She denies increased shortness of breath, abdomen tightness or edema in legs.  Mood is good.  She has family close by for support.  She has every thing she needs for daily living.  She is able to afford her medications and has them all.  She places them in a box and has been compliant.  She is aware she can take an extra fluid pill if needed for edema.  She is aware of her up coming appts. We discussed fluid and low sodium diet.  She measures her fluids.  Will continue to visit for heart failure, diet and medication management.   Earmon Phoenix [place] EMT-Paramedic [phone]

## 2022-09-04 ENCOUNTER — Other Ambulatory Visit (HOSPITAL_COMMUNITY): Payer: Self-pay

## 2022-09-04 ENCOUNTER — Ambulatory Visit (HOSPITAL_BASED_OUTPATIENT_CLINIC_OR_DEPARTMENT_OTHER): Payer: Medicare Other | Admitting: Family

## 2022-09-04 ENCOUNTER — Other Ambulatory Visit
Admission: RE | Admit: 2022-09-04 | Discharge: 2022-09-04 | Disposition: A | Discharge status: Home / Self Care | Payer: Medicare Other | Source: Ambulatory Visit | Attending: Family | Admitting: Family

## 2022-09-04 ENCOUNTER — Encounter: Payer: Self-pay | Admitting: Pharmacist

## 2022-09-04 ENCOUNTER — Encounter: Payer: Self-pay | Admitting: Family

## 2022-09-04 VITALS — BP 135/67 | HR 69 | Resp 18 | Wt 215.1 lb

## 2022-09-04 DIAGNOSIS — I5032 Chronic diastolic (congestive) heart failure: Secondary | ICD-10-CM

## 2022-09-04 DIAGNOSIS — E119 Type 2 diabetes mellitus without complications: Secondary | ICD-10-CM

## 2022-09-04 DIAGNOSIS — I110 Hypertensive heart disease with heart failure: Secondary | ICD-10-CM | POA: Insufficient documentation

## 2022-09-04 DIAGNOSIS — Z794 Long term (current) use of insulin: Secondary | ICD-10-CM

## 2022-09-04 DIAGNOSIS — I10 Essential (primary) hypertension: Secondary | ICD-10-CM

## 2022-09-04 DIAGNOSIS — I480 Paroxysmal atrial fibrillation: Secondary | ICD-10-CM

## 2022-09-04 DIAGNOSIS — Z87891 Personal history of nicotine dependence: Secondary | ICD-10-CM | POA: Insufficient documentation

## 2022-09-04 DIAGNOSIS — Z7901 Long term (current) use of anticoagulants: Secondary | ICD-10-CM | POA: Insufficient documentation

## 2022-09-04 LAB — BASIC METABOLIC PANEL
Anion gap: 5 (ref 5–15)
BUN: 21 mg/dL (ref 8–23)
CO2: 29 mmol/L (ref 22–32)
Calcium: 10 mg/dL (ref 8.9–10.3)
Chloride: 101 mmol/L (ref 98–111)
Creatinine, Ser: 0.94 mg/dL (ref 0.44–1.00)
GFR, Estimated: >60 mL/min (ref >60)
Glucose, Bld: 247 mg/dL — ABNORMAL HIGH (ref 70–99)
Potassium: 3.6 mmol/L (ref 3.5–5.1)
Sodium: 135 mmol/L (ref 135–145)

## 2022-09-04 MED ORDER — FUROSEMIDE 20 MG PO TABS: 20.0000 mg | ORAL_TABLET | Freq: Every day | ORAL | 3 refills | Status: DC

## 2022-09-04 MED ORDER — DAPAGLIFLOZIN PROPANEDIOL 10 MG PO TABS: 10.0000 mg | ORAL_TABLET | Freq: Every day | ORAL | 5 refills | Status: DC

## 2022-09-04 NOTE — Progress Notes (Deleted)
Chf  

## 2022-09-04 NOTE — Progress Notes (Signed)
[name], female    DOB: December 03, 1950, 71 y.o.   MRN: [id number]  HPI  Karen Murphy is a 71 y/o female with a history of DM, HTN, anemia, atrial fibrillation, previous tobacco use and chronic heart failure.   Echo report from 07/25/20 reviewed and showed an EF of 60-65% along with mild LAE.  Has not been admitted or been in the ED in the last 6 months.   She presents today for a follow-up visit with a chief complaint of minimal shortness of breath with moderate exertion. Describes this as chronic in nature. She has associated palpitations and slight weight gain along with this. She denies any dizziness, difficulty sleeping, abdominal distention, pedal edema, chest pain, cough or fatigue.   Has taken some extra furosemide a few times since last here due to weight gain.   Past Medical History:  Diagnosis Date   Anemia    Arthritis    Chronic heart failure with preserved ejection fraction (HFpEF) (HCC)    a. 07/2020 Echo: EF 60-65%, no rwma, nl RV fxn, mildly dil LA, mild-mod AoV sclerosis.   Diabetes mellitus without complication (HCC)    Hypertension    PAF (paroxysmal atrial fibrillation) (HCC)    a. CHA2DS2VASc = 5-->eliquis; b. 08/2020 Zio: Predominantly sinus rhythm 6 bpm.  1% burden of PAF (longest 2 hours and 38 minutes at 121).   No past surgical history on file. Family History  Problem Relation Age of Onset   Hypertension Mother    Hypertension Father    Social History   Tobacco Use   Smoking status: Former   Smokeless tobacco: Never  Substance Use Topics   Alcohol use: Not on file   Allergies  Allergen Reactions   Diltiazem Rash     Prior to Admission medications   Medication Sig Start Date End Date Taking? Authorizing Provider  acetaminophen (TYLENOL) 325 MG tablet Take 650 mg by mouth every 6 (six) hours as needed for mild pain or moderate pain.   Yes [provider]  apixaban (ELIQUIS) 5 MG TABS tablet Take 1 tablet by mouth twice daily  09/28/21  Yes Gollan, Tollie Pizza, MD  ascorbic acid (VITAMIN C) 500 MG tablet Take 500 mg by mouth daily.   Yes [provider]  atorvastatin (LIPITOR) 40 MG tablet Take 1 tablet (40 mg total) by mouth every evening. 09/14/20  Yes Antonieta Iba, MD  Calcium Carbonate-Vitamin D3 600-400 MG-UNIT TABS Take 1 tablet by mouth daily.   Yes [provider]  cephALEXin (KEFLEX) 500 MG capsule Take 500 mg by mouth 2 (two) times daily. Boils prevention   Yes [provider]  COZAAR 100 MG tablet Take 1 tablet (100 mg total) by mouth daily. 01/17/22  Yes Antione Obar, Inetta Fermo A, FNP  folic acid (FOLVITE) 400 MCG tablet Take 400 mcg by mouth daily.   Yes [provider]  furosemide (LASIX) 20 MG tablet Take 1 tablet by mouth once daily 10/17/21  Yes Crystall Donaldson A, FNP  glipiZIDE (GLUCOTROL XL) 10 MG 24 hr tablet Take 10 mg by mouth daily. 02/03/20  Yes [provider]  magnesium oxide (MAG-OX) 400 MG tablet Take 250 mg by mouth 2 (two) times daily.   Yes [provider]  metFORMIN (GLUCOPHAGE-XR) 500 MG 24 hr tablet Take 1,500 mg by mouth daily with supper. 10/19/19  Yes [provider]  metoprolol tartrate (LOPRESSOR) 50 MG tablet TAKE 1 & 1/2 (ONE & ONE-HALF) TABLETS BY MOUTH  TWICE DAILY Patient taking differently: Take 50 mg by mouth 2 (two) times daily. Take one tablet twice a day 11/13/21  Yes Gollan, Tollie Pizza, MD  ondansetron (ZOFRAN) 8 MG tablet Take 8 mg by mouth every 8 (eight) hours as needed for nausea or vomiting.   Yes [provider]  TOUJEO SOLOSTAR 300 UNIT/ML Solostar Pen Inject 18 Units into the skin at bedtime.   Yes [provider]  TRULICITY 0.75 MG/0.5ML SOPN Inject 0.75 mg into the skin once a week. Mondays   Yes [provider]  verapamil (CALAN-SR) 120 MG CR tablet Take 1 tablet (120 mg total) by mouth daily. 04/23/22  Yes Delma Freeze, FNP   Review of Systems  Constitutional:  Negative for appetite  change and fatigue.  HENT:  Negative for congestion, postnasal drip and sore throat.   Eyes: Negative.   Respiratory:  Positive for shortness of breath (minimal). Negative for cough and chest tightness.   Cardiovascular:  Positive for palpitations. Negative for chest pain and leg swelling.  Gastrointestinal:  Negative for abdominal distention and abdominal pain.  Endocrine: Negative.   Genitourinary: Negative.   Musculoskeletal:  Negative for back pain, myalgias and neck pain.  Skin: Negative.   Allergic/Immunologic: Negative.   Neurological:  Negative for dizziness and light-headedness.  Hematological:  Negative for adenopathy. Does not bruise/bleed easily.  Psychiatric/Behavioral:  Negative for dysphoric mood and sleep disturbance (sleeping well on wedge pillow). The patient is not nervous/anxious.    Vitals:   09/04/22 1139  BP: 135/67  Pulse: 69  Resp: 18  SpO2: 98%  Weight: 215 lb 2 oz (97.6 kg)   Wt Readings from Last 3 Encounters:  09/04/22 215 lb 2 oz (97.6 kg)  08/22/22 211 lb (95.7 kg)  06/04/22 210 lb 4 oz (95.4 kg)   Lab Results  Component Value Date   CREATININE 0.94 09/04/2022   CREATININE 0.79 11/21/2021   CREATININE 0.66 09/01/2021   Physical Exam Vitals and nursing note reviewed.  Constitutional:      Appearance: Normal appearance.  HENT:     Head: Normocephalic and atraumatic.  Cardiovascular:     Rate and Rhythm: Normal rate and regular rhythm.  Pulmonary:     Effort: Pulmonary effort is normal. No respiratory distress.     Breath sounds: No wheezing or rales.  Abdominal:     General: There is no distension.     Palpations: Abdomen is soft.  Musculoskeletal:        General: No tenderness.     Cervical back: Normal range of motion and neck supple.     Right lower leg: No edema.     Left lower leg: No edema.  Skin:    General: Skin is warm and dry.  Neurological:     General: No focal deficit present.     Mental Status: She is alert and  oriented to person, place, and time.  Psychiatric:        Mood and Affect: Mood normal.        Behavior: Behavior normal.        Thought Content: Thought content normal.    Assessment & Plan:  1: Chronic heart failure with preserved ejection fraction with structural changes (LAE)- - NYHA class II - euvolemic today - weighing daily; reminded to call for an overnight weight gain of > 2 pounds or a weekly weight gain of > 5 pounds - weight up 5 pounds from last visit here 3 months  ago - not adding salt and has been diligent about reading food labels so that she can follow a low sodium diet  - saw cardiology Karen Murphy) 05/18/22 - consider changing losartan to entresto but would need patient assistance - will add farxiga 10mg  daily; 30 day voucher provided - BMP to be done at next visit - BNP 07/18/21 was 289.7 - participating in paramedicine program  - PharmD reconciled medications with the patient - hasn't gotten flu vaccine yet  2: HTN- - BP looks good (135/67) - saw PCP (Lavins) 02/26/22 - BMP 11/21/21 reviewed and showed sodium 139, potassium 4.3, creatinine 0.79 and GFR 80 - check BMP today since starting farxiga per above  3: Type 2 DM- - A1c 02/26/22 was 7.1% - on truliticy, toujeo, glipizide, metformin  4: Paroxysmal Atrial fibrillation- - previously wore Zio monitor November 2021 - currently rate controlled on verapamil and metoprolol tartrate - currently on eliquis    Medication bottles reviewed.   Return in 1 month, sooner if needed.

## 2022-09-04 NOTE — Patient Instructions (Addendum)
Continue weighing daily and call for an overnight weight gain of 3 pounds or more or a weekly weight gain of more than 5 pounds.   If you have voicemail, please make sure your mailbox is cleaned out so that we may leave a message and please make sure to listen to any voicemails.    Begin farxiga as 1 tablet every morning    

## 2022-09-12 ENCOUNTER — Other Ambulatory Visit (HOSPITAL_COMMUNITY): Payer: Self-pay

## 2022-09-12 ENCOUNTER — Telehealth (HOSPITAL_COMMUNITY): Payer: Self-pay

## 2022-09-12 NOTE — Telephone Encounter (Signed)
Pharmacy Patient Advocate Encounter  Insurance verification completed.    The patient is insured through [employer]   Ran test claims for the following: ENTRESTO 24-26 MG TAB AND FARXIGA 10 MG TAB.  Copays and coinsurance results were relayed to Inpatient clinical team.  Haze Rushing, CPhT Pharmacy Patient Advocate Specialist Direct Number: [phone] Fax: [phone]

## 2022-09-12 NOTE — TOC Benefit Eligibility Note (Signed)
Patient Product/process development scientist completed.    The patient is currently admitted and upon discharge could be taking Entresto 24-26 MG tab and Farxiga 10 MG tab.  The current 30 day co-pay is $35.72 for Entresto.  The current 30 day co-pay is $30.24 for [place].  The patient is insured through [employer] D   Haze Rushing, CPhT Pharmacy Patient Advocate Specialist Direct Number: [phone] Fax: [phone]

## 2022-09-12 NOTE — Progress Notes (Unsigned)
[name], female   DOB: 1950-11-30, 71 y.o.   MRN: [id number] [facility] - PHARMACIST COUNSELING NOTE  *HFpEF*  Guideline-Directed Medical Therapy/Evidence Based Medicine  ACE/ARB/ARNI: Losartan 100 mg daily Beta Blocker: Metoprolol tartrate 50 mg twice daily Aldosterone Antagonist:  none Diuretic: Furosemide 20 mg daily SGLT2i:  none  Adherence Assessment  Do you ever forget to take your medication? [] Yes [x] No  Do you ever skip doses due to side effects? [] Yes [x] No  Do you have trouble affording your medicines? [] Yes [x] No  Are you ever unable to pick up your medication due to transportation difficulties? [] Yes [x] No  Do you ever stop taking your medications because you don't believe they are helping? [] Yes [x] No  Do you check your weight daily? [x] Yes [] No   Adherence strategy: n/a  Barriers to obtaining medications: none  Vital signs: HR 69, BP 135/67, weight (pounds) 215  ECHO: Date 07/25/2020, EF 60-65%, notes mild LAE      Latest Ref Rng & Units 09/04/2022   12:51 PM 11/21/2021    2:41 PM 09/01/2021   12:13 PM  BMP  Glucose 70 - 99 mg/dL     BUN 8 - 23 mg/dL 21  15  18    Creatinine 0.44 - 1.00 mg/dL     BUN/Creat Ratio 12 - 28  19    Sodium 135 - 145 mmol/L 135  139  138   Potassium 3.5 - 5.1 mmol/L 3.6  4.3  3.9   Chloride 98 - 111 mmol/L 101  102  102   CO2 22 - 32 mmol/L 29  21  29    Calcium 8.9 - 10.3 mg/dL   9.3     Past Medical History:  Diagnosis Date   Anemia    Arthritis    Chronic heart failure with preserved ejection fraction (HFpEF) (HCC)    a. 07/2020 Echo: EF 60-65%, no rwma, nl RV fxn, mildly dil LA, mild-mod AoV sclerosis.   Diabetes mellitus without complication (HCC)    Hypertension    PAF (paroxysmal atrial fibrillation) (HCC)    a. CHA2DS2VASc = 5-->eliquis; b. 08/2020 Zio: Predominantly sinus rhythm 6 bpm.  1% burden of PAF (longest 2  hours and 38 minutes at 121).    ASSESSMENT 71 year old female who presents to the HF clinic for follow up. PMH includes DM, HTN, anemia, atrial fibrillation, previous tobacco use and chronic heart failure. Last ED admission was over 6 months ago.  Medication Reconciliation completed during visit. Patient denies ADRs with current therapy or affordability problems.  PLAN  Start SGLT2i Repeat BMET Will transition from losartan to [facility] during next OV - will need patient assistance.  Time spent: 15 minutes  Chibueze Beasley Rodriguez-Guzman PharmD, BCPS 09/12/2022 2:55 PM    Current Outpatient Medications:    acetaminophen (TYLENOL) 325 MG tablet, Take 650 mg by mouth every 6 (six) hours as needed for mild pain or moderate pain., Disp: , Rfl:    apixaban (ELIQUIS) 5 MG TABS tablet, Take 1 tablet by mouth twice daily, Disp: 60 tablet, Rfl: 5   ascorbic acid (VITAMIN C) 500 MG tablet, Take 500 mg by mouth daily., Disp: , Rfl:    atorvastatin (LIPITOR) 40 MG tablet, Take 1 tablet (40 mg total) by mouth every evening., Disp: 90 tablet, Rfl: 3   Calcium Carbonate-Vitamin D3 600-400 MG-UNIT TABS, Take 1 tablet by mouth daily., Disp: , Rfl:  cephALEXin (KEFLEX) 500 MG capsule, Take 500 mg by mouth 2 (two) times daily. Boils prevention, Disp: , Rfl:    COZAAR 100 MG tablet, Take 1 tablet (100 mg total) by mouth daily., Disp: 30 tablet, Rfl: 5   dapagliflozin propanediol (FARXIGA) 10 MG TABS tablet, Take 1 tablet (10 mg total) by mouth daily before breakfast., Disp: 30 tablet, Rfl: 5   folic acid (FOLVITE) 400 MCG tablet, Take 400 mcg by mouth daily., Disp: , Rfl:    furosemide (LASIX) 20 MG tablet, Take 1 tablet (20 mg total) by mouth daily. And additional 20mg  as needed, Disp: 110 tablet, Rfl: 3   glipiZIDE (GLUCOTROL XL) 10 MG 24 hr tablet, Take 10 mg by mouth daily., Disp: , Rfl:    magnesium oxide (MAG-OX) 400 MG tablet, Take 250 mg by mouth 2 (two) times daily., Disp: , Rfl:    metFORMIN  (GLUCOPHAGE-XR) 500 MG 24 hr tablet, Take 1,500 mg by mouth daily with supper., Disp: , Rfl:    metoprolol tartrate (LOPRESSOR) 50 MG tablet, TAKE 1 & 1/2 (ONE & ONE-HALF) TABLETS BY MOUTH TWICE DAILY (Patient taking differently: Take 50 mg by mouth 2 (two) times daily. Take one tablet twice a day), Disp: 270 tablet, Rfl: 0   ondansetron (ZOFRAN) 8 MG tablet, Take 8 mg by mouth every 8 (eight) hours as needed for nausea or vomiting., Disp: , Rfl:    TOUJEO SOLOSTAR 300 UNIT/ML Solostar Pen, Inject 18 Units into the skin at bedtime., Disp: , Rfl:    TRULICITY 0.75 MG/0.5ML SOPN, Inject 0.75 mg into the skin once a week. Mondays, Disp: , Rfl:    verapamil (CALAN-SR) 120 MG CR tablet, Take 1 tablet (120 mg total) by mouth daily., Disp: 30 tablet, Rfl: 0   MEDICATION ADHERENCES TIPS AND STRATEGIES Taking medication as prescribed improves patient outcomes in heart failure (reduces hospitalizations, improves symptoms, increases survival) Side effects of medications can be managed by decreasing doses, switching agents, stopping drugs, or adding additional therapy. Please let someone in the Heart Failure Clinic know if you have having bothersome side effects so we can modify your regimen. Do not alter your medication regimen without talking to [birth year]-1974.  Medication reminders can help patients remember to take drugs on time. If you are missing or forgetting doses you can try linking behaviors, using pill boxes, or an electronic reminder like an alarm on your phone or an app. Some people can also get automated phone calls as medication reminders.

## 2022-10-10 ENCOUNTER — Ambulatory Visit: Payer: Medicare Other | Admitting: Family

## 2022-10-13 ENCOUNTER — Other Ambulatory Visit: Payer: Self-pay

## 2022-10-13 ENCOUNTER — Ambulatory Visit
Admission: EM | Admit: 2022-10-13 | Discharge: 2022-10-13 | Disposition: A | Discharge status: Home / Self Care | Payer: Medicare Other | Attending: Emergency Medicine | Admitting: Emergency Medicine

## 2022-10-13 ENCOUNTER — Ambulatory Visit (INDEPENDENT_AMBULATORY_CARE_PROVIDER_SITE_OTHER): Payer: Medicare Other

## 2022-10-13 DIAGNOSIS — R0989 Other specified symptoms and signs involving the circulatory and respiratory systems: Secondary | ICD-10-CM | POA: Diagnosis not present

## 2022-10-13 DIAGNOSIS — R5383 Other fatigue: Secondary | ICD-10-CM | POA: Diagnosis not present

## 2022-10-13 DIAGNOSIS — J069 Acute upper respiratory infection, unspecified: Secondary | ICD-10-CM | POA: Diagnosis not present

## 2022-10-13 DIAGNOSIS — R059 Cough, unspecified: Secondary | ICD-10-CM | POA: Diagnosis not present

## 2022-10-13 DIAGNOSIS — R052 Subacute cough: Secondary | ICD-10-CM | POA: Diagnosis not present

## 2022-10-13 MED ORDER — BENZONATATE 100 MG PO CAPS: 200.0000 mg | ORAL_CAPSULE | Freq: Three times a day (TID) | ORAL | 0 refills | Status: DC

## 2022-10-13 MED ORDER — IPRATROPIUM BROMIDE 0.06 % NA SOLN: 2.0000 | Freq: Four times a day (QID) | NASAL | 12 refills | Status: DC

## 2022-10-13 NOTE — ED Triage Notes (Signed)
Pt endorses cough, fatigue and bloody nose. Symptoms started around Thanksgiving and she has not completely been well since then.

## 2022-10-13 NOTE — ED Provider Notes (Signed)
[facility]    CSN: [id number] Arrival date & time: 10/13/22  1353      History   Chief Complaint Chief Complaint  Patient presents with   Cough    HPI Karen Murphy is a 71 y.o. female.   HPI  71 year old female here for evaluation of respiratory complaints.  The patient reports that her symptoms began around Thanksgiving and at that time she had fever, chills, nausea, and vomiting.  Since then she is continued with some runny nose with clear nasal discharge, a cough that is occasionally productive for white sputum, mild shortness of breath, and wheezing.  She has not had any other fevers.  She has a significant past medical history to include acute respiratory failure, type 2 diabetes, essential hypertension, atrial fibrillation, flash pulm edema, and congestive heart failure.  She states that she has not talked to her primary care doctor about her symptoms and she has not been evaluated.  Past Medical History:  Diagnosis Date   Anemia    Arthritis    Chronic heart failure with preserved ejection fraction (HFpEF) (HCC)    a. 07/2020 Echo: EF 60-65%, no rwma, nl RV fxn, mildly dil LA, mild-mod AoV sclerosis.   Diabetes mellitus without complication (HCC)    Hypertension    PAF (paroxysmal atrial fibrillation) (HCC)    a. CHA2DS2VASc = 5-->eliquis; b. 08/2020 Zio: Predominantly sinus rhythm 6 bpm.  1% burden of PAF (longest 2 hours and 38 minutes at 121).    Patient Active Problem List   Diagnosis Date Noted   Acute on chronic diastolic CHF (congestive heart failure) (HCC) 11/21/2020   Flash pulmonary edema (HCC) 11/19/2020   AF (paroxysmal atrial fibrillation) (HCC) 11/19/2020   Atrial fibrillation with rapid ventricular response (HCC) 07/24/2020   Syncope and collapse 07/24/2020   Community acquired pneumonia of right lower lobe of lung    Essential hypertension    Acute respiratory failure (HCC) 04/13/2020   Lobar pneumonia (HCC)    Type 2 diabetes  mellitus without complication, with long-term current use of insulin (HCC)     History reviewed. No pertinent surgical history.  OB History   No obstetric history on file.      Home Medications    Prior to Admission medications   Medication Sig Start Date End Date Taking? Authorizing Provider  benzonatate (TESSALON) 100 MG capsule Take 2 capsules (200 mg total) by mouth every 8 (eight) hours. 10/13/22  Yes Becky Augusta, NP  ipratropium (ATROVENT) 0.06 % nasal spray Place 2 sprays into both nostrils 4 (four) times daily. 10/13/22  Yes Becky Augusta, NP  acetaminophen (TYLENOL) 325 MG tablet Take 650 mg by mouth every 6 (six) hours as needed for mild pain or moderate pain.    [provider]  apixaban (ELIQUIS) 5 MG TABS tablet Take 1 tablet by mouth twice daily 09/28/21   Antonieta Iba, MD  ascorbic acid (VITAMIN C) 500 MG tablet Take 500 mg by mouth daily.    [provider]  atorvastatin (LIPITOR) 40 MG tablet Take 1 tablet (40 mg total) by mouth every evening. 09/14/20   Antonieta Iba, MD  Calcium Carbonate-Vitamin D3 600-400 MG-UNIT TABS Take 1 tablet by mouth daily.    [provider]  cephALEXin (KEFLEX) 500 MG capsule Take 500 mg by mouth 2 (two) times daily. Boils prevention    [provider]  dapagliflozin propanediol (FARXIGA) 10 MG TABS tablet Take 1 tablet (10 mg total) by mouth  daily before breakfast. 09/04/22   Delma Freeze, FNP  folic acid (FOLVITE) 400 MCG tablet Take 400 mcg by mouth daily.    [provider]  furosemide (LASIX) 20 MG tablet Take 1 tablet (20 mg total) by mouth daily. And additional 20mg  as needed 09/04/22   09/06/22 A, FNP  glipiZIDE (GLUCOTROL XL) 10 MG 24 hr tablet Take 10 mg by mouth daily. 02/03/20   [provider]  magnesium oxide (MAG-OX) 400 MG tablet Take 250 mg by mouth 2 (two) times daily.    [provider]  metFORMIN (GLUCOPHAGE-XR) 500 MG 24 hr tablet Take 1,500 mg  by mouth daily with supper. 10/19/19   [provider]  metoprolol tartrate (LOPRESSOR) 50 MG tablet TAKE 1 & 1/2 (ONE & ONE-HALF) TABLETS BY MOUTH TWICE DAILY Patient taking differently: Take 50 mg by mouth 2 (two) times daily. Take one tablet twice a day 11/13/21   11/15/21, MD  ondansetron (ZOFRAN) 8 MG tablet Take 8 mg by mouth every 8 (eight) hours as needed for nausea or vomiting.    [provider]  TOUJEO SOLOSTAR 300 UNIT/ML Solostar Pen Inject 18 Units into the skin at bedtime.    [provider]  TRULICITY 0.75 MG/0.5ML SOPN Inject 0.75 mg into the skin once a week. Mondays    [provider]  verapamil (CALAN-SR) 120 MG CR tablet Take 1 tablet (120 mg total) by mouth daily. 04/23/22   06/24/22, FNP    Family History Family History  Problem Relation Age of Onset   Hypertension Mother    Hypertension Father     Social History Social History   Tobacco Use   Smoking status: Former   Smokeless tobacco: Never  Delma Freeze Use: Former  Substance Use Topics   Alcohol use: Not Currently     Allergies   Diltiazem   Review of Systems Review of Systems  Constitutional:  Positive for fatigue. Negative for fever.  HENT:  Positive for congestion and rhinorrhea. Negative for ear pain and sore throat.   Respiratory:  Positive for cough, shortness of breath and wheezing.      Physical Exam Triage Vital Signs ED Triage Vitals  Enc Vitals Group     BP 10/13/22 1452 127/68     Pulse Rate 10/13/22 1452 71     Resp 10/13/22 1452 18     Temp 10/13/22 1452 98.9 F (37.2 C)     Temp Source 10/13/22 1452 Oral     SpO2 10/13/22 1452 97 %     Weight 10/13/22 1453 208 lb (94.3 kg)     Height 10/13/22 1453 5\' 6"  (1.676 m)     Head Circumference --      Peak Flow --      Pain Score 10/13/22 1453 0     Pain Loc --      Pain Edu? --      Excl. in GC? --    No data found.  Updated Vital Signs BP 127/68 (BP Location: Left  Arm)   Pulse 71   Temp 98.9 F (37.2 C) (Oral)   Resp 18   Ht 5\' 6"  (1.676 m)   Wt 208 lb (94.3 kg)   SpO2 97%   BMI 33.57 kg/m   Visual Acuity Right Eye Distance:   Left Eye Distance:   Bilateral Distance:    Right Eye Near:   Left Eye Near:  Bilateral Near:     Physical Exam Vitals and nursing note reviewed.  Constitutional:      Appearance: Normal appearance.  HENT:     Head: Normocephalic and atraumatic.     Right Ear: Tympanic membrane, ear canal and external ear normal. There is no impacted cerumen.     Left Ear: Tympanic membrane, ear canal and external ear normal. There is no impacted cerumen.     Nose: Congestion and rhinorrhea present.     Comments: Nasal mucosa is erythematous and edematous with scant clear rhinorrhea in both nares.    Mouth/Throat:     Mouth: Mucous membranes are moist.     Pharynx: Oropharynx is clear. No posterior oropharyngeal erythema.  Cardiovascular:     Rate and Rhythm: Normal rate and regular rhythm.     Pulses: Normal pulses.     Heart sounds: Normal heart sounds. No murmur heard.    No friction rub. No gallop.  Pulmonary:     Effort: Pulmonary effort is normal.     Breath sounds: Normal breath sounds. No wheezing, rhonchi or rales.  Musculoskeletal:     Cervical back: Normal range of motion and neck supple.  Lymphadenopathy:     Cervical: No cervical adenopathy.  Skin:    General: Skin is warm and dry.     Capillary Refill: Capillary refill takes less than 2 seconds.     Findings: No erythema or rash.  Neurological:     General: No focal deficit present.     Mental Status: She is alert and oriented to person, place, and time.  Psychiatric:        Mood and Affect: Mood normal.        Behavior: Behavior normal.        Thought Content: Thought content normal.        Judgment: Judgment normal.      UC Treatments / Results  Labs (all labs ordered are listed, but only abnormal results are displayed) Labs Reviewed - No  data to display  EKG   Radiology DG Chest 2 View  Result Date: 10/13/2022 CLINICAL DATA:  Cough and chest congestion for 1 month. Fatigue. Epistaxis. EXAM: CHEST - 2 VIEW COMPARISON:  Two view chest x-ray 07/18/2021 FINDINGS: Heart size is normal. Previously seen basilar airspace opacities have near completely resolved. Minimal opacity at the left base likely reflects atelectasis or residual scarring. No edema or effusions are present. Mild degenerative changes of the thoracic spine are again noted. IMPRESSION: 1. Near complete resolution of previously seen basilar airspace opacities. 2. Minimal left basilar atelectasis or scarring. 3. No new or progressive disease. Electronically Signed   By: Marin Roberts M.D.   On: 10/13/2022 15:12    Procedures Procedures (including critical care time)  Medications Ordered in UC Medications - No data to display  Initial Impression / Assessment and Plan / UC Course  I have reviewed the triage vital signs and the nursing notes.  Pertinent labs & imaging results that were available during my care of the patient were reviewed by me and considered in my medical decision making (see chart for details).   Patient is a pleasant, nontoxic-appearing 71 year old female who is a significant pulmonary history presenting for evaluation of 4 weeks worth of cough with runny nose and nasal congestion.  Her sputum production is clear to white and her nasal discharge is clear.  She states that at the onset she had pretty significant symptoms to include fever, chills, nausea,  and vomiting.  She was not evaluated at that time.  She is able to speak in full sentence without any dyspnea or tachypnea.  A chest x-ray was obtained at triage which shows near complete resolution of previously seen basilar airspace opacities but no new or progressive disease.  She does have nasal mucosal inflammation with clear rhinorrhea and clear postnasal drip.  Cardiopulmonary exam reveals  clear lung sounds in all fields.  Is been taking over-the-counter Coricidin with some relief of symptoms.  I have encouraged her to continue doing that.  I will also add on Atrovent nasal spray to help with nasal congestion and postnasal drip.  In addition, I will prescribe Tessalon Perles to help her with her cough.  If her symptoms continue she should follow-up with her primary care provider.   Final Clinical Impressions(s) / UC Diagnoses   Final diagnoses:  Upper respiratory tract infection, unspecified type  Subacute cough     Discharge Instructions      Use the Atrovent nasal spray, 2 squirts in each nostril every 6 hours, as needed for runny nose and postnasal drip.  Use the Tessalon Perles every 8 hours during the day.  Take them with a small sip of water.  They may give you some numbness to the base of your tongue or a metallic taste in your mouth, this is normal.  Continue the Coricidin for cold symptoms.  Return for reevaluation or see your primary care provider for any new or worsening symptoms.      ED Prescriptions     Medication Sig Dispense Auth. Provider   benzonatate (TESSALON) 100 MG capsule Take 2 capsules (200 mg total) by mouth every 8 (eight) hours. 21 capsule Becky Augusta, NP   ipratropium (ATROVENT) 0.06 % nasal spray Place 2 sprays into both nostrils 4 (four) times daily. 15 mL Becky Augusta, NP      PDMP not reviewed this encounter.   Becky Augusta, NP 10/13/22 1540

## 2022-10-13 NOTE — Discharge Instructions (Signed)
Use the Atrovent nasal spray, 2 squirts in each nostril every 6 hours, as needed for runny nose and postnasal drip.  Use the Tessalon Perles every 8 hours during the day.  Take them with a small sip of water.  They may give you some numbness to the base of your tongue or a metallic taste in your mouth, this is normal.  Continue the Coricidin for cold symptoms.  Return for reevaluation or see your primary care provider for any new or worsening symptoms.

## 2022-10-26 ENCOUNTER — Encounter: Payer: Self-pay | Admitting: Pharmacy Technician

## 2022-10-26 ENCOUNTER — Ambulatory Visit: Payer: Medicare Other | Attending: Family | Admitting: Family

## 2022-10-26 ENCOUNTER — Encounter: Payer: Self-pay | Admitting: Family

## 2022-10-26 VITALS — BP 138/63 | HR 69 | Wt 209.0 lb

## 2022-10-26 DIAGNOSIS — I480 Paroxysmal atrial fibrillation: Secondary | ICD-10-CM

## 2022-10-26 DIAGNOSIS — Z87891 Personal history of nicotine dependence: Secondary | ICD-10-CM | POA: Insufficient documentation

## 2022-10-26 DIAGNOSIS — I110 Hypertensive heart disease with heart failure: Secondary | ICD-10-CM | POA: Diagnosis present

## 2022-10-26 DIAGNOSIS — E119 Type 2 diabetes mellitus without complications: Secondary | ICD-10-CM

## 2022-10-26 DIAGNOSIS — I10 Essential (primary) hypertension: Secondary | ICD-10-CM

## 2022-10-26 DIAGNOSIS — Z794 Long term (current) use of insulin: Secondary | ICD-10-CM | POA: Diagnosis not present

## 2022-10-26 DIAGNOSIS — Z7901 Long term (current) use of anticoagulants: Secondary | ICD-10-CM | POA: Insufficient documentation

## 2022-10-26 DIAGNOSIS — I5032 Chronic diastolic (congestive) heart failure: Secondary | ICD-10-CM

## 2022-10-26 NOTE — Patient Instructions (Signed)
Continue weighing daily and call for an overnight weight gain of 3 pounds or more or a weekly weight gain of more than 5 pounds.   If you have voicemail, please make sure your mailbox is cleaned out so that we may leave a message and please make sure to listen to any voicemails.     

## 2022-10-26 NOTE — Progress Notes (Signed)
[name] - PHARMACIST COUNSELING NOTE  Guideline-Directed Medical Therapy/Evidence Based Medicine  ACE/ARB/ARNI: Losartan 100 mg daily Beta Blocker: Metoprolol tartrate 50 mg twice daily Aldosterone Antagonist:  None Diuretic: Furosemide 20 mg daily SGLT2i: Dapagliflozin 10 mg daily  Adherence Assessment  Do you ever forget to take your medication? [] Yes [x] No  Do you ever skip doses due to side effects? [] Yes [x] No  Do you have trouble affording your medicines? [] Yes [x] No  Are you ever unable to pick up your medication due to transportation difficulties? [] Yes [x] No  Do you ever stop taking your medications because you don't believe they are helping? [] Yes [x] No  Do you check your weight daily? [x] Yes [] No   Adherence strategy: Pillbox  Barriers to obtaining medications: None  Vital signs: HR 69, BP 138/63, weight (pounds) 209 ECHO: Date 07/2020, EF 60-65%     Latest Ref Rng & Units 09/04/2022   12:51 PM 11/21/2021    2:41 PM 09/01/2021   12:13 PM  BMP  Glucose 70 - 99 mg/dL 247  206  107   BUN 8 - 23 mg/dL 21  15  18    Creatinine 0.44 - 1.00 mg/dL 0.94  0.79  0.66   BUN/Creat Ratio 12 - 28  19    Sodium 135 - 145 mmol/L 135  139  138   Potassium 3.5 - 5.1 mmol/L 3.6  4.3  3.9   Chloride 98 - 111 mmol/L 101  102  102   CO2 22 - 32 mmol/L 29  21  29    Calcium 8.9 - 10.3 mg/dL 10.0  10.0  9.3     Past Medical History:  Diagnosis Date   Anemia    Arthritis    Chronic heart failure with preserved ejection fraction (HFpEF) ([place])    a. 07/2020 Echo: EF 60-65%, no rwma, nl RV fxn, mildly dil LA, mild-mod AoV sclerosis.   Diabetes mellitus without complication ([place])    Hypertension    PAF (paroxysmal atrial fibrillation) ([place])    a. CHA2DS2VASc = 5-->eliquis; b. 08/2020 Zio: Predominantly sinus rhythm 6 bpm.  1% burden of PAF (longest 2 hours and 38 minutes at 121).    ASSESSMENT 72 year old female with PMH HTN,  Afib, T2DM who presents to the HF clinic for follow-up. Most recent ECHO in 07/2020 showed EF 60-65%. Regarding GDMT, patient is taking Farxiga 10 mg daily, Lopressor 50 mg BID. Patient is down 6 lbs from 08/2022. Most recent K was 3.6, which is low end of normal, and below target of K 4.0 in the setting of Afib and CHF. Patient's CHADSVASc is 5. Regarding T2DM, patient has been hesitant to use Trulicity due to uncertainty with recent medication changes if it was still appropriate, and endorses that her home BG readings have been higher as a result.  Recent ED Visit (past 6 months): Date - 09/2022, [facility] -  Upper RTI  PLAN CHF/HTN Recommend re-checking ECHO. Pending the ECHO results, recommend considering initiation of spironolactone 12.5 mg daily as well as possible switch from Losartan to Entresto. Continue Farxiga, Lopressor, and Losartan. Continue furosemide.  Afib Continue Lopressor, verapamil, and Eliquis.  T2DM Continue metformin, glipizide, Trulicity, and Toujeo.   Time spent: 15 minutes  Karen Murphy, PharmD PGY-1 Pharmacy Resident 10/26/2022 2:12 PM   Current Outpatient Medications:    acetaminophen (TYLENOL) 325 MG tablet, Take 650 mg by mouth every 6 (six) hours as needed for mild pain or moderate pain.,  Disp: , Rfl:    apixaban (ELIQUIS) 5 MG TABS tablet, Take 1 tablet by mouth twice daily, Disp: 60 tablet, Rfl: 5   ascorbic acid (VITAMIN C) 500 MG tablet, Take 500 mg by mouth daily., Disp: , Rfl:    atorvastatin (LIPITOR) 40 MG tablet, Take 1 tablet (40 mg total) by mouth every evening., Disp: 90 tablet, Rfl: 3   Calcium Carbonate-Vitamin D3 600-400 MG-UNIT TABS, Take 1 tablet by mouth daily., Disp: , Rfl:    cephALEXin (KEFLEX) 500 MG capsule, Take 500 mg by mouth 2 (two) times daily. Boils prevention, Disp: , Rfl:    dapagliflozin propanediol (FARXIGA) 10 MG TABS tablet, Take 1 tablet (10 mg total) by mouth daily before breakfast., Disp: 30 tablet, Rfl: 5   folic acid  (FOLVITE) 542 MCG tablet, Take 400 mcg by mouth daily., Disp: , Rfl:    furosemide (LASIX) 20 MG tablet, Take 1 tablet (20 mg total) by mouth daily. And additional 20mg  as needed, Disp: 110 tablet, Rfl: 3   glipiZIDE (GLUCOTROL XL) 10 MG 24 hr tablet, Take 10 mg by mouth daily., Disp: , Rfl:    losartan (COZAAR) 100 MG tablet, Take 100 mg by mouth daily., Disp: , Rfl:    magnesium oxide (MAG-OX) 400 MG tablet, Take 250 mg by mouth 2 (two) times daily., Disp: , Rfl:    metFORMIN (GLUCOPHAGE-XR) 500 MG 24 hr tablet, Take 1,500 mg by mouth daily with supper., Disp: , Rfl:    metoprolol tartrate (LOPRESSOR) 50 MG tablet, TAKE 1 & 1/2 (ONE & ONE-HALF) TABLETS BY MOUTH TWICE DAILY (Patient taking differently: Take 50 mg by mouth 2 (two) times daily. Take one tablet twice a day), Disp: 270 tablet, Rfl: 0   Multiple Vitamins-Minerals (MULTIVITAMIN WITH MINERALS) tablet, Take 1 tablet by mouth daily., Disp: , Rfl:    TOUJEO SOLOSTAR 300 UNIT/ML Solostar Pen, Inject 18 Units into the skin at bedtime., Disp: , Rfl:    TRULICITY 7.06 CB/7.6EG SOPN, Inject 0.75 mg into the skin once a week. Mondays, Disp: , Rfl:    verapamil (CALAN-SR) 120 MG CR tablet, Take 1 tablet (120 mg total) by mouth daily., Disp: 30 tablet, Rfl: 0   DRUGS TO CAUTION IN HEART FAILURE  Drug or Class Mechanism  Analgesics NSAIDs COX-2 inhibitors Glucocorticoids  Sodium and water retention, increased systemic vascular resistance, decreased response to diuretics   Diabetes Medications Metformin Thiazolidinediones Rosiglitazone (Avandia) Pioglitazone (Actos) DPP4 Inhibitors Saxagliptin (Onglyza) Sitagliptin (Januvia)   Lactic acidosis Possible calcium channel blockade   Unknown  Antiarrhythmics Class I  Flecainide Disopyramide Class III Sotalol Other Dronedarone  Negative inotrope, proarrhythmic   Proarrhythmic, beta blockade  Negative inotrope  Antihypertensives Alpha Blockers Doxazosin Calcium Channel  Blockers Diltiazem Verapamil Nifedipine Central Alpha Adrenergics Moxonidine Peripheral Vasodilators Minoxidil  Increases renin and aldosterone  Negative inotrope    Possible sympathetic withdrawal  Unknown  Anti-infective Itraconazole Amphotericin B  Negative inotrope Unknown  Hematologic Anagrelide Cilostazol   Possible inhibition of PD IV Inhibition of PD III causing arrhythmias  Neurologic/Psychiatric Stimulants Anti-Seizure Drugs Carbamazepine Pregabalin Antidepressants Tricyclics Citalopram Parkinsons Bromocriptine Pergolide Pramipexole Antipsychotics Clozapine Antimigraine Ergotamine Methysergide Appetite suppressants Bipolar Lithium  Peripheral alpha and beta agonist activity  Negative inotrope and chronotrope Calcium channel blockade  Negative inotrope, proarrhythmic Dose-dependent QT prolongation  Excessive serotonin activity/valvular damage Excessive serotonin activity/valvular damage Unknown  IgE mediated hypersensitivy, calcium channel blockade  Excessive serotonin activity/valvular damage Excessive serotonin activity/valvular damage Valvular damage  Direct myofibrillar degeneration, adrenergic stimulation  Antimalarials Chloroquine Hydroxychloroquine Intracellular inhibition of lysosomal enzymes  Urologic Agents Alpha Blockers Doxazosin Prazosin Tamsulosin Terazosin  Increased renin and aldosterone  Adapted from Page Carleene Overlie, et al. "Drugs That May Cause or Exacerbate Heart Failure: A Scientific Statement from the American Heart  Association." Circulation 2016; O8193432. DOI: 10.1161/CIR.0000000000000426   MEDICATION ADHERENCES TIPS AND STRATEGIES Taking medication as prescribed improves patient outcomes in heart failure (reduces hospitalizations, improves symptoms, increases survival) Side effects of medications can be managed by decreasing doses, switching agents, stopping drugs, or adding additional therapy. Please let  someone in the [facility] know if you have having bothersome side effects so we can modify your regimen. Do not alter your medication regimen without talking to [place].  Medication reminders can help patients remember to take drugs on time. If you are missing or forgetting doses you can try linking behaviors, using pill boxes, or an electronic reminder like an alarm on your phone or an app. Some people can also get automated phone calls as medication reminders.

## 2022-10-26 NOTE — Progress Notes (Signed)
[name], female    DOB: [birth year], 72 y.o.   MRN: [id number]  HPI  Karen Murphy is a 72 y/o female with a history of DM, HTN, anemia, atrial fibrillation, previous tobacco use and chronic heart failure.   Echo report from 07/25/20 reviewed and showed an EF of 60-65% along with mild LAE.  Has not been admitted or been in the ED in the last 6 months.   She presents today for a follow-up visit with a chief complaint of moderate fatigue with minimal exertion. Describes this as chronic. Has associated shortness of breath and gradual weight loss along with this. Denies any difficulty sleeping, dizziness, abdominal distention, palpitations, pedal edema, chest pain or cough.   She says that her glucose levels have been under better control since she's been taking farxiga.   Past Medical History:  Diagnosis Date   Anemia    Arthritis    Chronic heart failure with preserved ejection fraction (HFpEF) ([place])    a. 07/2020 Echo: EF 60-65%, no rwma, nl RV fxn, mildly dil LA, mild-mod AoV sclerosis.   Diabetes mellitus without complication ([place])    Hypertension    PAF (paroxysmal atrial fibrillation) ([facility])    a. CHA2DS2VASc = 5-->eliquis; b. 08/2020 Zio: Predominantly sinus rhythm 6 bpm.  1% burden of PAF (longest 2 hours and 38 minutes at 121).   No past surgical history on file. Family History  Problem Relation Age of Onset   Hypertension Mother    Hypertension Father    Social History   Tobacco Use   Smoking status: Former   Smokeless tobacco: Never  Substance Use Topics   Alcohol use: Not Currently   Allergies  Allergen Reactions   Diltiazem Rash     Prior to Admission medications   Medication Sig Start Date End Date Taking? Authorizing Provider  acetaminophen (TYLENOL) 325 MG tablet Take 650 mg by mouth every 6 (six) hours as needed for mild pain or moderate pain.   Yes [provider]  apixaban (ELIQUIS) 5 MG TABS tablet Take 1 tablet by mouth twice daily  09/28/21  Yes Gollan, Kathlene November, MD  ascorbic acid (VITAMIN C) 500 MG tablet Take 500 mg by mouth daily.   Yes [provider]  atorvastatin (LIPITOR) 40 MG tablet Take 1 tablet (40 mg total) by mouth every evening. 09/14/20  Yes Minna Merritts, MD  Calcium Carbonate-Vitamin D3 600-400 MG-UNIT TABS Take 1 tablet by mouth daily.   Yes [provider]  cephALEXin (KEFLEX) 500 MG capsule Take 500 mg by mouth 2 (two) times daily. Boils prevention   Yes [provider]  dapagliflozin propanediol (FARXIGA) 10 MG TABS tablet Take 1 tablet (10 mg total) by mouth daily before breakfast. 09/04/22  Yes Dellamae Rosamilia A, FNP  folic acid (FOLVITE) 709 MCG tablet Take 400 mcg by mouth daily.   Yes [provider]  furosemide (LASIX) 20 MG tablet Take 1 tablet (20 mg total) by mouth daily. And additional 20mg  as needed 09/04/22  Yes Riverlyn Kizziah, Otila Kluver A, FNP  glipiZIDE (GLUCOTROL XL) 10 MG 24 hr tablet Take 10 mg by mouth daily. 02/03/20  Yes [provider]  losartan (COZAAR) 100 MG tablet Take 100 mg by mouth daily.   Yes [provider]  magnesium oxide (MAG-OX) 400 MG tablet Take 250 mg by mouth 2 (two) times daily.   Yes [provider]  metFORMIN (GLUCOPHAGE-XR) 500 MG 24 hr tablet Take 1,500 mg by mouth  daily with supper. 10/19/19  Yes [provider]  metoprolol tartrate (LOPRESSOR) 50 MG tablet TAKE 1 & 1/2 (ONE & ONE-HALF) TABLETS BY MOUTH TWICE DAILY Patient taking differently: Take 50 mg by mouth 2 (two) times daily. Take one tablet twice a day 11/13/21  Yes Gollan, Kathlene November, MD  Multiple Vitamins-Minerals (MULTIVITAMIN WITH MINERALS) tablet Take 1 tablet by mouth daily.   Yes [provider]  TOUJEO SOLOSTAR 300 UNIT/ML Solostar Pen Inject 18 Units into the skin at bedtime.   Yes [provider]  TRULICITY 6.96 EX/5.2WU SOPN Inject 0.75 mg into the skin once a week. Mondays   Yes [provider]  verapamil  (CALAN-SR) 120 MG CR tablet Take 1 tablet (120 mg total) by mouth daily. 04/23/22  Yes Alisa Graff, FNP   Review of Systems  Constitutional:  Positive for fatigue (tire easily). Negative for appetite change.  HENT:  Negative for congestion, postnasal drip and sore throat.   Eyes: Negative.   Respiratory:  Positive for shortness of breath (minimal). Negative for cough and chest tightness.   Cardiovascular:  Negative for chest pain, palpitations and leg swelling.  Gastrointestinal:  Negative for abdominal distention and abdominal pain.  Endocrine: Negative.   Genitourinary: Negative.   Musculoskeletal:  Negative for back pain, myalgias and neck pain.  Skin: Negative.   Allergic/Immunologic: Negative.   Neurological:  Negative for dizziness and light-headedness.  Hematological:  Negative for adenopathy. Does not bruise/bleed easily.  Psychiatric/Behavioral:  Negative for dysphoric mood and sleep disturbance (sleeping well on wedge pillow). The patient is not nervous/anxious.    Vitals:   10/26/22 1027  BP: 138/63  Pulse: 69  SpO2: 98%  Weight: 209 lb (94.8 kg)   Wt Readings from Last 3 Encounters:  10/26/22 209 lb (94.8 kg)  10/13/22 208 lb (94.3 kg)  09/04/22 215 lb 2 oz (97.6 kg)   Lab Results  Component Value Date   CREATININE 0.94 09/04/2022   CREATININE 0.79 11/21/2021   CREATININE 0.66 09/01/2021   Physical Exam Vitals and nursing note reviewed.  Constitutional:      Appearance: Normal appearance.  HENT:     Head: Normocephalic and atraumatic.  Cardiovascular:     Rate and Rhythm: Normal rate and regular rhythm.  Pulmonary:     Effort: Pulmonary effort is normal. No respiratory distress.     Breath sounds: No wheezing or rales.  Abdominal:     General: There is no distension.     Palpations: Abdomen is soft.  Musculoskeletal:        General: No tenderness.     Cervical back: Normal range of motion and neck supple.     Right lower leg: No edema.     Left  lower leg: No edema.  Skin:    General: Skin is warm and dry.  Neurological:     General: No focal deficit present.     Mental Status: She is alert and oriented to person, place, and time.  Psychiatric:        Mood and Affect: Mood normal.        Behavior: Behavior normal.        Thought Content: Thought content normal.    Assessment & Plan:  1: Chronic heart failure with preserved ejection fraction with structural changes (LAE)- - NYHA class III - euvolemic today - weighing daily; reminded to call for an overnight weight gain of > 2 pounds or a weekly weight gain of > 5  pounds - weight down 6 pounds from last visit here 6 weeks ago - not adding salt and has been diligent about reading food labels so that she can follow a low sodium diet  - saw cardiology Mariah Milling) 05/18/22 - have scheduled echo for 11/22/22 - consider changing losartan to entresto but would need patient assistance; will get echo results back first - on farxiga - BNP 07/18/21 was 289.7 - PharmD reconciled medications with the patient - received her flu vaccine for this season  2: HTN- - BP 138/63 - saw PCP Joycelyn Man) 09/05/22 - BMP 09/04/22 reviewed and showed sodium 135, potassium 3.6, creatinine 0.94 and GFR >60 - BMP next visit  3: Type 2 DM- - A1c 09/05/22 was 8.4% - on truliticy, toujeo, glipizide, metformin - home glucose this morning was 135  4: Paroxysmal Atrial fibrillation- - previously wore Zio monitor November 2021 - currently rate controlled on verapamil and metoprolol tartrate - currently on eliquis    Medication bottles reviewed.   Return in 1 month, sooner if needed.

## 2022-11-21 ENCOUNTER — Telehealth: Payer: Self-pay | Admitting: Family

## 2022-11-21 NOTE — Telephone Encounter (Signed)
LVM with patient reminding her of her echo tomorrow 2/8 .   Evony Rezek, NT

## 2022-11-22 ENCOUNTER — Ambulatory Visit: Admission: RE | Admit: 2022-11-22 | Payer: Medicare Other | Source: Ambulatory Visit

## 2022-11-28 ENCOUNTER — Encounter: Payer: Medicare Other | Admitting: Family

## 2022-12-13 ENCOUNTER — Ambulatory Visit
Admission: RE | Admit: 2022-12-13 | Discharge: 2022-12-13 | Disposition: A | Discharge status: Home / Self Care | Payer: Medicare Other | Source: Ambulatory Visit | Attending: Family | Admitting: Family

## 2022-12-13 DIAGNOSIS — I110 Hypertensive heart disease with heart failure: Secondary | ICD-10-CM | POA: Insufficient documentation

## 2022-12-13 DIAGNOSIS — E119 Type 2 diabetes mellitus without complications: Secondary | ICD-10-CM | POA: Insufficient documentation

## 2022-12-13 DIAGNOSIS — I480 Paroxysmal atrial fibrillation: Secondary | ICD-10-CM | POA: Insufficient documentation

## 2022-12-13 DIAGNOSIS — I5032 Chronic diastolic (congestive) heart failure: Secondary | ICD-10-CM | POA: Diagnosis not present

## 2022-12-13 LAB — ECHOCARDIOGRAM COMPLETE
AR max vel: 2.08 cm2
AV Area VTI: 2.02 cm2
AV Area mean vel: 1.82 cm2
AV Mean grad: 4 mmHg
AV Peak grad: 6 mmHg
Ao pk vel: 1.22 m/s
Area-P 1/2: 3.83 cm2
S' Lateral: 2.75 cm

## 2022-12-13 NOTE — Progress Notes (Signed)
*  PRELIMINARY RESULTS* Echocardiogram 2D Echocardiogram has been performed.  [name] 12/13/2022, 10:54 AM

## 2022-12-18 ENCOUNTER — Ambulatory Visit: Payer: Medicare Other | Attending: Family | Admitting: Family

## 2022-12-18 ENCOUNTER — Encounter: Payer: Self-pay | Admitting: Family

## 2022-12-18 VITALS — BP 137/80 | HR 80 | Wt 213.8 lb

## 2022-12-18 DIAGNOSIS — Z794 Long term (current) use of insulin: Secondary | ICD-10-CM | POA: Diagnosis not present

## 2022-12-18 DIAGNOSIS — Z87891 Personal history of nicotine dependence: Secondary | ICD-10-CM | POA: Diagnosis not present

## 2022-12-18 DIAGNOSIS — Z79899 Other long term (current) drug therapy: Secondary | ICD-10-CM | POA: Insufficient documentation

## 2022-12-18 DIAGNOSIS — Z7984 Long term (current) use of oral hypoglycemic drugs: Secondary | ICD-10-CM | POA: Diagnosis not present

## 2022-12-18 DIAGNOSIS — I110 Hypertensive heart disease with heart failure: Secondary | ICD-10-CM | POA: Insufficient documentation

## 2022-12-18 DIAGNOSIS — E119 Type 2 diabetes mellitus without complications: Secondary | ICD-10-CM

## 2022-12-18 DIAGNOSIS — I10 Essential (primary) hypertension: Secondary | ICD-10-CM

## 2022-12-18 DIAGNOSIS — I5032 Chronic diastolic (congestive) heart failure: Secondary | ICD-10-CM

## 2022-12-18 DIAGNOSIS — I480 Paroxysmal atrial fibrillation: Secondary | ICD-10-CM | POA: Diagnosis not present

## 2022-12-18 NOTE — Patient Instructions (Signed)
Continue weighing daily and call for an overnight weight gain of 3 pounds or more or a weekly weight gain of more than 5 pounds. ? ?

## 2022-12-18 NOTE — Progress Notes (Signed)
[name], female    DOB: Jan 26, 1951, 72 y.o.   MRN: [id number]  HPI  [name] is a 72 y/o female with a history of DM, HTN, anemia, atrial fibrillation, previous tobacco use and chronic heart failure.   Echo 12/13/22 showed an EF of 55-60% along with mild LVH, normal PA pressure and mild MR. Echo report from 07/25/20 reviewed and showed an EF of 60-65% along with mild LAE.  Was in the ED 10/13/22 due to URI.   She presents today for a HF follow-up visit with a chief complaint of minimal fatigue with moderate exertion. Describes this as chronic in nature. Has no other symptoms and specifically denies any dizziness, difficulty sleeping, cough, SOB, chest pain, pedal edema, palpitations, abdominal distention or weight gain.   Home weight ranges from 206-208 pounds.   Past Medical History:  Diagnosis Date   Anemia    Arthritis    Chronic heart failure with preserved ejection fraction (HFpEF) ([place])    a. 07/2020 Echo: EF 60-65%, no rwma, nl RV fxn, mildly dil LA, mild-mod AoV sclerosis.   Diabetes mellitus without complication ([place])    Hypertension    PAF (paroxysmal atrial fibrillation) ([place])    a. CHA2DS2VASc = 5-->eliquis; b. 08/2020 Zio: Predominantly sinus rhythm 6 bpm.  1% burden of PAF (longest 2 hours and 38 minutes at 121).   No past surgical history on file. Family History  Problem Relation Age of Onset   Hypertension Mother    Hypertension Father    Social History   Tobacco Use   Smoking status: Former   Smokeless tobacco: Never  Substance Use Topics   Alcohol use: Not Currently   Allergies  Allergen Reactions   Diltiazem Rash     Prior to Admission medications   Medication Sig Start Date End Date Taking? Authorizing Provider  acetaminophen (TYLENOL) 325 MG tablet Take 650 mg by mouth every 6 (six) hours as needed for mild pain or moderate pain.   Yes [provider]  apixaban (ELIQUIS) 5 MG TABS tablet Take 1 tablet by mouth twice daily  09/28/21  Yes Gollan, Kathlene November, MD  ascorbic acid (VITAMIN C) 500 MG tablet Take 500 mg by mouth daily.   Yes [provider]  atorvastatin (LIPITOR) 40 MG tablet Take 1 tablet (40 mg total) by mouth every evening. 09/14/20  Yes Minna Merritts, MD  Calcium Carbonate-Vitamin D3 600-400 MG-UNIT TABS Take 1 tablet by mouth daily.   Yes [provider]  cephALEXin (KEFLEX) 500 MG capsule Take 500 mg by mouth 2 (two) times daily. Boils prevention   Yes [provider]  dapagliflozin propanediol (FARXIGA) 10 MG TABS tablet Take 1 tablet (10 mg total) by mouth daily before breakfast. 09/04/22  Yes Albertine Lafoy A, FNP  folic acid (FOLVITE) A999333 MCG tablet Take 400 mcg by mouth daily.   Yes [provider]  furosemide (LASIX) 20 MG tablet Take 1 tablet (20 mg total) by mouth daily. And additional '20mg'$  as needed 09/04/22  Yes Tena Linebaugh, Otila Kluver A, FNP  glipiZIDE (GLUCOTROL XL) 10 MG 24 hr tablet Take 10 mg by mouth daily. 02/03/20  Yes [provider]  losartan (COZAAR) 100 MG tablet Take 100 mg by mouth daily.   Yes [provider]  metFORMIN (GLUCOPHAGE-XR) 500 MG 24 hr tablet Take 1,500 mg by mouth daily with supper. 10/19/19  Yes [provider]  metoprolol tartrate (LOPRESSOR) 50 MG tablet TAKE 1 & 1/2 (ONE &  ONE-HALF) TABLETS BY MOUTH TWICE DAILY Patient taking differently: Take 50 mg by mouth 2 (two) times daily. Take one tablet twice a day 11/13/21  Yes Gollan, Kathlene November, MD  Multiple Vitamins-Minerals (MULTIVITAMIN WITH MINERALS) tablet Take 1 tablet by mouth daily.   Yes [provider]  TOUJEO SOLOSTAR 300 UNIT/ML Solostar Pen Inject 18 Units into the skin at bedtime.   Yes [provider]  TRULICITY A999333 0000000 SOPN Inject 0.75 mg into the skin once a week. Mondays   Yes [provider]  verapamil (CALAN-SR) 120 MG CR tablet Take 1 tablet (120 mg total) by mouth daily. 04/23/22  Yes Marshun Duva, Otila Kluver A, FNP  magnesium  oxide (MAG-OX) 400 MG tablet Take 250 mg by mouth 2 (two) times daily.    [provider]   Review of Systems  Constitutional:  Positive for fatigue (improving). Negative for appetite change.  HENT:  Negative for congestion, postnasal drip and sore throat.   Eyes: Negative.   Respiratory:  Negative for cough, chest tightness and shortness of breath.   Cardiovascular:  Negative for chest pain, palpitations and leg swelling.  Gastrointestinal:  Negative for abdominal distention and abdominal pain.  Endocrine: Negative.   Genitourinary: Negative.   Musculoskeletal:  Negative for back pain, myalgias and neck pain.  Skin: Negative.   Allergic/Immunologic: Negative.   Neurological:  Negative for dizziness and light-headedness.  Hematological:  Negative for adenopathy. Does not bruise/bleed easily.  Psychiatric/Behavioral:  Negative for dysphoric mood and sleep disturbance (sleeping well on wedge pillow). The patient is not nervous/anxious.    Vitals:   12/18/22 1450  BP: 137/80  Pulse: 80  SpO2: 98%  Weight: 213 lb 12.8 oz (97 kg)   Wt Readings from Last 3 Encounters:  12/18/22 213 lb 12.8 oz (97 kg)  10/26/22 209 lb (94.8 kg)  10/13/22 208 lb (94.3 kg)   Lab Results  Component Value Date   CREATININE 0.94 09/04/2022   CREATININE 0.79 11/21/2021   CREATININE 0.66 09/01/2021   Physical Exam Vitals and nursing note reviewed.  Constitutional:      Appearance: Normal appearance.  HENT:     Head: Normocephalic and atraumatic.  Cardiovascular:     Rate and Rhythm: Normal rate and regular rhythm.  Pulmonary:     Effort: Pulmonary effort is normal. No respiratory distress.     Breath sounds: No wheezing or rales.  Abdominal:     General: There is no distension.     Palpations: Abdomen is soft.  Musculoskeletal:        General: No tenderness.     Cervical back: Normal range of motion and neck supple.     Right lower leg: No edema.     Left lower leg: No edema.   Skin:    General: Skin is warm and dry.  Neurological:     General: No focal deficit present.     Mental Status: She is alert and oriented to person, place, and time.  Psychiatric:        Mood and Affect: Mood normal.        Behavior: Behavior normal.        Thought Content: Thought content normal.    Assessment & Plan:  1: Chronic heart failure with preserved ejection fraction with structural changes (LVH)- - NYHA class II - euvolemic today - weighing daily; reminded to call for an overnight weight gain of > 2 pounds or a weekly weight gain of > 5 pounds -  weight up 4 pounds from last visit here 6 weeks ago - not adding salt and has been diligent about reading food labels so that she can follow a low sodium diet  - saw cardiology Rockey Situ) 05/18/22 - reviewed echo - entresto patient assistance paperwork provided to her and if she can get approved, will change her losartan to entresto 97/'103mg'$  at that time - farxiga '10mg'$  daily - losartan '100mg'$  daily - furosemide '20mg'$  daily - BNP 07/18/21 was 289.7  2: HTN- - BP 137/80 - saw PCP ([facility]) 12/10/22 - BMP 12/10/22 reviewed and showed sodium 138, potassium 3.9, creatinine 0.87 and GFR 71  3: Type 2 DM- - A1c 12/10/22 was 7.5% - glipizide '10mg'$  daily - metformin '1500mg'$  daily - toujeo and trulicity  4: Paroxysmal Atrial fibrillation- - previously wore Zio monitor November 2021 - currently rate controlled on verapamil and metoprolol tartrate - apixaban '5mg'$  BID - metoprolol tartrate '50mg'$  BID - verapamil '120mg'$  daily - given eliquis patient assistance forms today and she will bring these back   Medication bottles reviewed.   Return in 1 month, sooner if needed

## 2023-01-18 ENCOUNTER — Encounter: Payer: Medicare Other | Admitting: Family

## 2023-01-21 ENCOUNTER — Telehealth: Payer: Self-pay

## 2023-01-21 DIAGNOSIS — I480 Paroxysmal atrial fibrillation: Secondary | ICD-10-CM

## 2023-01-21 MED ORDER — APIXABAN 5 MG PO TABS: 5.0000 mg | ORAL_TABLET | Freq: Two times a day (BID) | ORAL | 5 refills | Status: DC

## 2023-01-21 NOTE — Telephone Encounter (Signed)
Pt called requesting Eliquis 5mg  tabs refill.  Refill for Eliquis 5 mg tabs completed and sent to [facility].

## 2023-01-30 ENCOUNTER — Telehealth (HOSPITAL_COMMUNITY): Payer: Self-pay

## 2023-01-30 ENCOUNTER — Encounter: Payer: Self-pay | Admitting: Family

## 2023-01-30 ENCOUNTER — Inpatient Hospital Stay (HOSPITAL_COMMUNITY)
Admission: RE | Admit: 2023-01-30 | Discharge: 2023-01-30 | Disposition: A | Discharge status: Home / Self Care | Payer: Medicare Other | Source: Ambulatory Visit | Attending: Cardiology | Admitting: Cardiology

## 2023-01-30 ENCOUNTER — Other Ambulatory Visit (HOSPITAL_COMMUNITY): Payer: Self-pay

## 2023-01-30 ENCOUNTER — Ambulatory Visit: Payer: Medicare Other | Attending: Family | Admitting: Family

## 2023-01-30 ENCOUNTER — Other Ambulatory Visit (HOSPITAL_COMMUNITY): Payer: Self-pay | Admitting: Cardiology

## 2023-01-30 ENCOUNTER — Other Ambulatory Visit: Payer: Self-pay

## 2023-01-30 VITALS — BP 147/79 | HR 61 | Wt 211.5 lb

## 2023-01-30 DIAGNOSIS — Z7901 Long term (current) use of anticoagulants: Secondary | ICD-10-CM | POA: Diagnosis not present

## 2023-01-30 DIAGNOSIS — Z794 Long term (current) use of insulin: Secondary | ICD-10-CM | POA: Diagnosis not present

## 2023-01-30 DIAGNOSIS — I110 Hypertensive heart disease with heart failure: Secondary | ICD-10-CM | POA: Diagnosis not present

## 2023-01-30 DIAGNOSIS — Z87891 Personal history of nicotine dependence: Secondary | ICD-10-CM | POA: Diagnosis not present

## 2023-01-30 DIAGNOSIS — Z7984 Long term (current) use of oral hypoglycemic drugs: Secondary | ICD-10-CM | POA: Diagnosis not present

## 2023-01-30 DIAGNOSIS — I4819 Other persistent atrial fibrillation: Secondary | ICD-10-CM

## 2023-01-30 DIAGNOSIS — I10 Essential (primary) hypertension: Secondary | ICD-10-CM

## 2023-01-30 DIAGNOSIS — I5032 Chronic diastolic (congestive) heart failure: Secondary | ICD-10-CM | POA: Insufficient documentation

## 2023-01-30 DIAGNOSIS — I480 Paroxysmal atrial fibrillation: Secondary | ICD-10-CM

## 2023-01-30 DIAGNOSIS — E119 Type 2 diabetes mellitus without complications: Secondary | ICD-10-CM | POA: Insufficient documentation

## 2023-01-30 DIAGNOSIS — Z79899 Other long term (current) drug therapy: Secondary | ICD-10-CM | POA: Diagnosis not present

## 2023-01-30 NOTE — Patient Instructions (Signed)
Medication Changes:  None, Continue current medications  Lab Work:  none  Testing/Procedures:  Your provider has recommended that  you wear a Zio Patch for 14 days.  This monitor will record your heart rhythm for our review.  IF you have any symptoms while wearing the monitor please press the button.  If you have any issues with the patch or you notice a red or orange light on it please call the company at [phone].  Once you remove the patch please mail it back to the company as soon as possible so we can get the results.  Special Instructions // Education:  Do the following things EVERYDAY: Weigh yourself in the morning before breakfast. Write it down and keep it in a log. Take your medicines as prescribed Eat low salt foods--Limit salt (sodium) to 2000 mg per day.  Stay as active as you can everyday Limit all fluids for the day to less than 2 liters   Follow-Up in: 3 months     If you have any questions or concerns before your next appointment please send [place] a message through mychart or call our office at [phone] Monday-Friday 8 am-5 pm.   If you have an urgent need after hours on the weekend please call your Primary Cardiologist or the [facility] in [place] at [phone].

## 2023-01-30 NOTE — Progress Notes (Signed)
[name], female    DOB: 10-15-1951, 72 y.o.   MRN: [id number]  Primary cardiologist: Julien Nordmann, MD (last seen 08/23; returns 08/24) PCP: [facility] (last seen 02/24)  HPI  Karen Murphy is a 72 y/o female with a history of DM, HTN, anemia, atrial fibrillation, previous tobacco use and chronic heart failure.   Echo 12/13/22: EF of 55-60% along with mild LVH, normal PA pressure and mild MR. Echo 07/25/20: EF of 60-65% along with mild LAE.  Was in the ED 10/13/22 due to URI.   She presents today for a HF follow-up visit with a chief complaint of moderate fatigue with little exertion. Chronic in nature although has worsened over the last 2 weeks. During this time, she has also noticed palpitations. Denies difficulty sleeping, dizziness, abdominal distention, pedal edema, chest pain, SOB, cough or weight gain.   Has been helping her sister over these last 2 weeks because she fell and broke her leg. She figured her worsening symptoms were due to the stress of helping take care of her.   Past Medical History:  Diagnosis Date   Anemia    Arthritis    Chronic heart failure with preserved ejection fraction (HFpEF) (HCC)    a. 07/2020 Echo: EF 60-65%, no rwma, nl RV fxn, mildly dil LA, mild-mod AoV sclerosis.   Diabetes mellitus without complication (HCC)    Hypertension    PAF (paroxysmal atrial fibrillation) (HCC)    a. CHA2DS2VASc = 5-->eliquis; b. 08/2020 Zio: Predominantly sinus rhythm 6 bpm.  1% burden of PAF (longest 2 hours and 38 minutes at 121).   No past surgical history on file. Family History  Problem Relation Age of Onset   Hypertension Mother    Hypertension Father    Social History   Tobacco Use   Smoking status: Former   Smokeless tobacco: Never  Substance Use Topics   Alcohol use: Not Currently   Allergies  Allergen Reactions   Diltiazem Rash   Prior to Admission medications   Medication Sig Start Date End Date Taking?  Authorizing Provider  acetaminophen (TYLENOL) 325 MG tablet Take 650 mg by mouth every 6 (six) hours as needed for mild pain or moderate pain.   Yes [provider]  apixaban (ELIQUIS) 5 MG TABS tablet Take 1 tablet (5 mg total) by mouth 2 (two) times daily. 01/21/23  Yes Cattaleya Wien, Inetta Fermo A, FNP  ascorbic acid (VITAMIN C) 500 MG tablet Take 500 mg by mouth daily.   Yes [provider]  atorvastatin (LIPITOR) 40 MG tablet Take 1 tablet (40 mg total) by mouth every evening. 09/14/20  Yes Antonieta Iba, MD  Calcium Carbonate-Vitamin D3 600-400 MG-UNIT TABS Take 1 tablet by mouth daily.   Yes [provider]  cephALEXin (KEFLEX) 500 MG capsule Take 500 mg by mouth 2 (two) times daily. Boils prevention   Yes [provider]  dapagliflozin propanediol (FARXIGA) 10 MG TABS tablet Take 1 tablet (10 mg total) by mouth daily before breakfast. 09/04/22  Yes Oluwatobiloba Martin A, FNP  folic acid (FOLVITE) 400 MCG tablet Take 400 mcg by mouth daily.   Yes [provider]  furosemide (LASIX) 20 MG tablet Take 1 tablet (20 mg total) by mouth daily. And additional 20mg  as needed 09/04/22  Yes Clarisa Kindred A, FNP  glipiZIDE (GLUCOTROL XL) 10 MG 24 hr tablet Take 10 mg by mouth daily. 02/03/20  Yes [provider]  losartan (COZAAR) 100 MG tablet  Take 100 mg by mouth daily.   Yes [provider]  magnesium oxide (MAG-OX) 400 MG tablet Take 250 mg by mouth 2 (two) times daily.   Yes [provider]  metFORMIN (GLUCOPHAGE-XR) 500 MG 24 hr tablet Take 1,500 mg by mouth daily with supper. 10/19/19  Yes [provider]  metoprolol tartrate (LOPRESSOR) 50 MG tablet Take 50 mg by mouth 2 (two) times daily.   Yes [provider]  Multiple Vitamins-Minerals (MULTIVITAMIN WITH MINERALS) tablet Take 1 tablet by mouth daily.   Yes [provider]  TOUJEO SOLOSTAR 300 UNIT/ML Solostar Pen Inject 18 Units into the skin at bedtime.   Yes  [provider]  TRULICITY 0.75 MG/0.5ML SOPN Inject 0.75 mg into the skin once a week. Mondays   Yes [provider]  verapamil (CALAN-SR) 120 MG CR tablet Take 1 tablet (120 mg total) by mouth daily. 04/23/22  Yes Delma Freeze, FNP    Review of Systems  Constitutional:  Positive for fatigue (worse over the last 2 weeks). Negative for appetite change.  HENT:  Negative for congestion, postnasal drip and sore throat.   Eyes: Negative.   Respiratory:  Negative for cough, chest tightness and shortness of breath.   Cardiovascular:  Positive for palpitations (over the last 2 weeks). Negative for chest pain and leg swelling.  Gastrointestinal:  Negative for abdominal distention and abdominal pain.  Endocrine: Negative.   Genitourinary: Negative.   Musculoskeletal:  Negative for back pain, myalgias and neck pain.  Skin: Negative.   Allergic/Immunologic: Negative.   Neurological:  Negative for dizziness and light-headedness.  Hematological:  Negative for adenopathy. Does not bruise/bleed easily.  Psychiatric/Behavioral:  Negative for dysphoric mood and sleep disturbance (sleeping well on wedge pillow). The patient is not nervous/anxious.    Vitals:   01/30/23 1138  BP: (!) 147/79  Pulse: 61  SpO2: 98%  Weight: 211 lb 8 oz (95.9 kg)   Wt Readings from Last 3 Encounters:  01/30/23 211 lb 8 oz (95.9 kg)  12/18/22 213 lb 12.8 oz (97 kg)  10/26/22 209 lb (94.8 kg)   Lab Results  Component Value Date   CREATININE 0.94 09/04/2022   CREATININE 0.79 11/21/2021   CREATININE 0.66 09/01/2021   Physical Exam Vitals and nursing note reviewed.  Constitutional:      Appearance: Normal appearance.  HENT:     Head: Normocephalic and atraumatic.  Cardiovascular:     Rate and Rhythm: Normal rate. Rhythm irregular.  Pulmonary:     Effort: Pulmonary effort is normal. No respiratory distress.     Breath sounds: No wheezing or rales.  Abdominal:     General: There is no  distension.     Palpations: Abdomen is soft.  Musculoskeletal:        General: No tenderness.     Cervical back: Normal range of motion and neck supple.     Right lower leg: No edema.     Left lower leg: No edema.  Skin:    General: Skin is warm and dry.  Neurological:     General: No focal deficit present.     Mental Status: She is alert and oriented to person, place, and time.  Psychiatric:        Mood and Affect: Mood normal.        Behavior: Behavior normal.        Thought Content: Thought content normal.    Assessment & Plan:  1: Chronic heart failure  with preserved ejection fraction- - NYHA class III - euvolemic today - weighing daily; reminded to call for an overnight weight gain of > 2 pounds or a weekly weight gain of > 5 pounds - weight down 2 pounds from last visit here 6 weeks ago - not adding salt and has been diligent about reading food labels so that she can follow a low sodium diet  - saw cardiology Mariah Milling) 05/18/22 - continue farxiga  daily - continue losartan  daily - continue furosemide  daily - have reached out to pharmacy to inquire about any grants for farxiga and possibly entresto; Pharm D will contact patient once she finds out something - BNP 07/18/21 was 289.7  2: HTN- - BP 147/79 - saw PCP ([facility]) 12/10/22 - BMP 12/10/22 reviewed and showed sodium 138, potassium 3.9, creatinine 0.87 and GFR 71  3: Type 2 DM- - A1c 12/10/22 was 7.5% - glipizide  daily - metformin  daily - toujeo and trulicity  4: Paroxysmal Atrial fibrillation- - previously wore Zio monitor November 2021 - currently rate controlled on verapamil and metoprolol tartrate - apixaban  BID; she has reached the doughnut hole so PharmD will see if patient qualifies for any assistance with this - metoprolol tartrate  BID - verapamil  daily - EKG today: Atrial fibrillation - with worsening symptoms and AF today, will place zio monitor for 14 days - have  messaged Dr. Mariah Milling to update and advised patient that she would hear from his office regarding this   Return in 3 months, sooner if needed.

## 2023-01-30 NOTE — Progress Notes (Signed)
Zio patch placed onto patient.  All instructions and information reviewed with patient, they verbalize understanding with no questions. 

## 2023-01-30 NOTE — Telephone Encounter (Signed)
Advanced Heart Failure Patient Advocate Encounter  The patient was approved for a [employer] grant that will help cover the cost of Farxiga, Losartan, Metoprolol.  Total amount awarded, $10,000.  Effective: 12/31/2022 - 12/30/2023.  BIN [id number] PCN [id number] [id number] ID [id number]   Approval and processing information provided to pt preferred pharmacy, left voicemail for patient. She has also inquired about assistance for Eliquis, which is not covered under this grant. Patient would need to apply for BMS assistance along with income and out of pocket costs.  Burnell Blanks, CPhT Rx Patient Advocate Phone: [phone]

## 2023-02-17 ENCOUNTER — Encounter: Payer: Self-pay | Admitting: Family

## 2023-02-18 NOTE — Telephone Encounter (Signed)
Hi Stephaine, can you please look into this. Thank you.

## 2023-02-25 ENCOUNTER — Telehealth (HOSPITAL_COMMUNITY): Payer: Self-pay | Admitting: *Deleted

## 2023-02-25 NOTE — Telephone Encounter (Signed)
Called patient per Dr. Shirlee Latch with following Zio results:  "Continuous atrial fibrillation, rate-controlled."  Pt verbalized understanding of same; no questions at this time.

## 2023-03-26 ENCOUNTER — Ambulatory Visit
Admission: EM | Admit: 2023-03-26 | Discharge: 2023-03-26 | Disposition: A | Discharge status: Home / Self Care | Payer: Medicare Other | Attending: Emergency Medicine | Admitting: Emergency Medicine

## 2023-03-26 DIAGNOSIS — L03317 Cellulitis of buttock: Secondary | ICD-10-CM

## 2023-03-26 MED ORDER — DOXYCYCLINE HYCLATE 100 MG PO CAPS: 100.0000 mg | ORAL_CAPSULE | Freq: Two times a day (BID) | ORAL | 0 refills | Status: DC

## 2023-03-26 NOTE — ED Provider Notes (Signed)
[facility]    CSN: [id number] Arrival date & time: 03/26/23  1214      History   Chief Complaint No chief complaint on file.   HPI Karen Murphy is a 72 y.o. female.   HPI  72 year old female with a past medical history significant for diabetes, hypertension, A-fib, congestive heart failure, anemia, and hidradenitis suppurativa presents for evaluation of possible abscess on her left buttock.  Reports that she developed a sore area on her left buttock 3 weeks ago and the area is continued to enlarge and become more painful.  Last night she reports a subjective fever along with sweats and chills.  She denies drainage.  She takes Keflex twice daily as preventative against hidradenitis suppurativa outbreaks.  Past Medical History:  Diagnosis Date   Anemia    Arthritis    Chronic heart failure with preserved ejection fraction (HFpEF) (HCC)    a. 07/2020 Echo: EF 60-65%, no rwma, nl RV fxn, mildly dil LA, mild-mod AoV sclerosis.   Diabetes mellitus without complication (HCC)    Hypertension    PAF (paroxysmal atrial fibrillation) (HCC)    a. CHA2DS2VASc = 5-->eliquis; b. 08/2020 Zio: Predominantly sinus rhythm 6 bpm.  1% burden of PAF (longest 2 hours and 38 minutes at 121).    Patient Active Problem List   Diagnosis Date Noted   Acute on chronic diastolic CHF (congestive heart failure) (HCC) 11/21/2020   Flash pulmonary edema (HCC) 11/19/2020   AF (paroxysmal atrial fibrillation) (HCC) 11/19/2020   Atrial fibrillation with rapid ventricular response (HCC) 07/24/2020   Syncope and collapse 07/24/2020   Community acquired pneumonia of right lower lobe of lung    Essential hypertension    Acute respiratory failure (HCC) 04/13/2020   Lobar pneumonia (HCC)    Type 2 diabetes mellitus without complication, with long-term current use of insulin (HCC)     History reviewed. No pertinent surgical history.  OB History   No obstetric history on file.      Home  Medications    Prior to Admission medications   Medication Sig Start Date End Date Taking? Authorizing Provider  doxycycline (VIBRAMYCIN) 100 MG capsule Take 1 capsule (100 mg total) by mouth 2 (two) times daily. 03/26/23  Yes Becky Augusta, NP  acetaminophen (TYLENOL) 325 MG tablet Take 650 mg by mouth every 6 (six) hours as needed for mild pain or moderate pain.    [provider]  apixaban (ELIQUIS) 5 MG TABS tablet Take 1 tablet (5 mg total) by mouth 2 (two) times daily. 01/21/23   Delma Freeze, FNP  ascorbic acid (VITAMIN C) 500 MG tablet Take 500 mg by mouth daily.    [provider]  atorvastatin (LIPITOR) 40 MG tablet Take 1 tablet (40 mg total) by mouth every evening. 09/14/20   Antonieta Iba, MD  Calcium Carbonate-Vitamin D3 600-400 MG-UNIT TABS Take 1 tablet by mouth daily.    [provider]  cephALEXin (KEFLEX) 500 MG capsule Take 500 mg by mouth 2 (two) times daily. Boils prevention    [provider]  dapagliflozin propanediol (FARXIGA) 10 MG TABS tablet Take 1 tablet (10 mg total) by mouth daily before breakfast. 09/04/22   Delma Freeze, FNP  folic acid (FOLVITE) 400 MCG tablet Take 400 mcg by mouth daily.    [provider]  furosemide (LASIX) 20 MG tablet Take 1 tablet (20 mg total) by mouth daily. And additional 20mg  as needed 09/04/22   Clarisa Kindred  A, FNP  glipiZIDE (GLUCOTROL XL) 10 MG 24 hr tablet Take 10 mg by mouth daily. 02/03/20   [provider]  losartan (COZAAR) 100 MG tablet Take 100 mg by mouth daily.    [provider]  magnesium oxide (MAG-OX) 400 MG tablet Take 250 mg by mouth 2 (two) times daily.    [provider]  metFORMIN (GLUCOPHAGE-XR) 500 MG 24 hr tablet Take 1,500 mg by mouth daily with supper. 10/19/19   [provider]  metoprolol tartrate (LOPRESSOR) 50 MG tablet Take 50 mg by mouth 2 (two) times daily.    [provider]  Multiple Vitamins-Minerals  (MULTIVITAMIN WITH MINERALS) tablet Take 1 tablet by mouth daily.    [provider]  TOUJEO SOLOSTAR 300 UNIT/ML Solostar Pen Inject 18 Units into the skin at bedtime.    [provider]  TRULICITY 0.75 MG/0.5ML SOPN Inject 0.75 mg into the skin once a week. Mondays    [provider]  verapamil (CALAN-SR) 120 MG CR tablet Take 1 tablet (120 mg total) by mouth daily. 04/23/22   Delma Freeze, FNP    Family History Family History  Problem Relation Age of Onset   Hypertension Mother    Hypertension Father     Social History Social History   Tobacco Use   Smoking status: Former   Smokeless tobacco: Never  Building services engineer Use: Former  Substance Use Topics   Alcohol use: Not Currently     Allergies   Diltiazem   Review of Systems Review of Systems  Constitutional:  Positive for chills, diaphoresis and fever.  Skin:  Positive for color change.     Physical Exam Triage Vital Signs ED Triage Vitals  Enc Vitals Group     BP      Pulse      Resp      Temp      Temp src      SpO2      Weight      Height      Head Circumference      Peak Flow      Pain Score      Pain Loc      Pain Edu?      Excl. in GC?    No data found.  Updated Vital Signs BP 123/78 (BP Location: Right Arm)   Pulse 63   Temp 98.7 F (37.1 C) (Oral)   SpO2 95%   Visual Acuity Right Eye Distance:   Left Eye Distance:   Bilateral Distance:    Right Eye Near:   Left Eye Near:    Bilateral Near:     Physical Exam Vitals and nursing note reviewed. Exam conducted with a chaperone present Edison Simon, CMA).  Constitutional:      Appearance: Normal appearance. She is not ill-appearing.  Skin:    General: Skin is warm and dry.     Capillary Refill: Capillary refill takes less than 2 seconds.     Findings: Erythema and lesion present.  Neurological:     General: No focal deficit present.     Mental Status: She is alert and oriented to person, place, and  time.  Psychiatric:        Mood and Affect: Mood normal.        Behavior: Behavior normal.        Thought Content: Thought content normal.        Judgment: Judgment normal.  UC Treatments / Results  Labs (all labs ordered are listed, but only abnormal results are displayed) Labs Reviewed - No data to display  EKG   Radiology No results found.  Procedures Procedures (including critical care time)  Medications Ordered in UC Medications - No data to display  Initial Impression / Assessment and Plan / UC Course  I have reviewed the triage vital signs and the nursing notes.  Pertinent labs & imaging results that were available during my care of the patient were reviewed by me and considered in my medical decision making (see chart for details).   The patient is a pleasant, nontoxic appearing 72 year old female presenting for evaluation of possible abscess in her left buttock.  She reports a subjective fever at home with associated sweats and chills but no documented fever.  She is afebrile here in clinic with a temperature of 98.7.  Heart rate is normal at 63, BP 123/78, satting 95% on room air.  With Edison Simon the medical assistant as chaperone I did inspect the area in question.  As you can see in the image above, there is a large area of hyperpigmentation with erythema and induration.  It is very tender to touch but there is no fluctuance indicating a fluid collection.  Is possible this is hidradenitis suppurativa or this could be cellulitis and an immature abscess.  The patient has been taking Keflex without improvement of her symptoms so I will switch her to doxycycline 100 mg twice daily for 10 days.  I will also encourage her to apply warm compresses to the area to see if she can get it to come to ahead and rupture on its own.  Return precautions reviewed.  Final Clinical Impressions(s) / UC Diagnoses   Final diagnoses:  Cellulitis of buttock     Discharge  Instructions      Take the Doxycycline twice daily with food for 10 days.  Doxycycline will make you more sensitive to sunburn so wear sunscreen when outdoors and reapply it every 90 minutes.  Apply warm compresses to help promote drainage.  Use OTC Tylenol and Ibuprofen according to the package instructions as needed for pain.  Return for new or worsening symptoms.       ED Prescriptions     Medication Sig Dispense Auth. Provider   doxycycline (VIBRAMYCIN) 100 MG capsule Take 1 capsule (100 mg total) by mouth 2 (two) times daily. 20 capsule Becky Augusta, NP      PDMP not reviewed this encounter.   Becky Augusta, NP 03/26/23 [phone]

## 2023-03-26 NOTE — Discharge Instructions (Addendum)
Take the Doxycycline twice daily with food for 10 days.  Doxycycline will make you more sensitive to sunburn so wear sunscreen when outdoors and reapply it every 90 minutes.  Apply warm compresses to help promote drainage.  Use OTC Tylenol and Ibuprofen according to the package instructions as needed for pain.  Return for new or worsening symptoms.   

## 2023-03-26 NOTE — ED Triage Notes (Signed)
Pt presents to UC for boil on bottom, pt states first started a little sore but now getting worse. Pt states she did have some sweats & chills last night. Pt also reports she does take keflex daily.

## 2023-05-01 ENCOUNTER — Other Ambulatory Visit
Admission: RE | Admit: 2023-05-01 | Discharge: 2023-05-01 | Disposition: A | Discharge status: Home / Self Care | Payer: Medicare Other | Source: Ambulatory Visit | Attending: Family | Admitting: Family

## 2023-05-01 ENCOUNTER — Ambulatory Visit: Payer: Medicare Other | Admitting: Family

## 2023-05-01 ENCOUNTER — Encounter: Payer: Self-pay | Admitting: Family

## 2023-05-01 VITALS — BP 117/56 | HR 77 | Ht 66.0 in | Wt 208.0 lb

## 2023-05-01 DIAGNOSIS — I4891 Unspecified atrial fibrillation: Secondary | ICD-10-CM | POA: Diagnosis not present

## 2023-05-01 DIAGNOSIS — E119 Type 2 diabetes mellitus without complications: Secondary | ICD-10-CM | POA: Diagnosis not present

## 2023-05-01 DIAGNOSIS — I5032 Chronic diastolic (congestive) heart failure: Secondary | ICD-10-CM | POA: Diagnosis not present

## 2023-05-01 DIAGNOSIS — I4819 Other persistent atrial fibrillation: Secondary | ICD-10-CM | POA: Diagnosis not present

## 2023-05-01 DIAGNOSIS — I10 Essential (primary) hypertension: Secondary | ICD-10-CM | POA: Diagnosis not present

## 2023-05-01 DIAGNOSIS — Z794 Long term (current) use of insulin: Secondary | ICD-10-CM

## 2023-05-01 LAB — BASIC METABOLIC PANEL
Anion gap: 5 (ref 5–15)
BUN: 27 mg/dL — ABNORMAL HIGH (ref 8–23)
CO2: 25 mmol/L (ref 22–32)
Calcium: 9.2 mg/dL (ref 8.9–10.3)
Chloride: 104 mmol/L (ref 98–111)
Creatinine, Ser: 1.19 mg/dL — ABNORMAL HIGH (ref 0.44–1.00)
GFR, Estimated: 48 mL/min — ABNORMAL LOW (ref >60)
Glucose, Bld: 251 mg/dL — ABNORMAL HIGH (ref 70–99)
Potassium: 4.1 mmol/L (ref 3.5–5.1)
Sodium: 134 mmol/L — ABNORMAL LOW (ref 135–145)

## 2023-05-01 NOTE — Progress Notes (Signed)
PCP: [facility] (last seen 05/24) Primary Cardiologist:Timothy Mariah Milling, MD (last seen 08/23; returns 08/24)  HPI:  Karen Murphy is a 72 y/o female with a history of DM, HTN, anemia, atrial fibrillation, RA, previous tobacco use and chronic heart failure.   Echo 07/25/20: EF of 60-65% along with mild LAE. Echo 12/13/22: EF of 55-60% along with mild LVH, normal PA pressure and mild MR.   Was in the ED 10/13/22 due to URI. Was in the ED 03/26/23 due to buttock cellulitis.   She presents today for a HF follow-up visit with a chief complaint of minimal SOB with moderate exertion. Chronic in nature. Has associated fatigue, palpitations (worsening) and numbness/ tingling in fingertips. Denies chest pain, cough, abdominal distention, pedal edema, dizziness, weight gain or difficulty sleeping. She does notice that she's in afib much more than she used to be and will sometimes feel palpitations even when she first wakes up in the morning.   Is concerned about her uncontrolled diabetes and is wanting to change her PCP to a [facility] practice but says that when she calls offices, she is told that new patients aren't being accepted.   Wore the zio monitor since she was last here.   ROS: All systems negative except as listed in HPI, PMH and Problem List.  SH:  Social History   Socioeconomic History   Marital status: Single    Spouse name: Not on file   Number of children: Not on file   Years of education: Not on file   Highest education level: Not on file  Occupational History   Not on file  Tobacco Use   Smoking status: Former   Smokeless tobacco: Never  Vaping Use   Vaping status: Former  Substance and Sexual Activity   Alcohol use: Not Currently   Drug use: Not on file   Sexual activity: Not on file  Other Topics Concern   Not on file  Social History Narrative   Not on file   Social Determinants of Health   Financial Resource Strain: Low Risk  (03/12/2023)   Received from  [facility], [facility]   Overall Financial Resource Strain (CARDIA)    Difficulty of Paying Living Expenses: Not very hard  Food Insecurity: No Food Insecurity (03/12/2023)   Received from [facility], [facility]   Hunger Vital Sign    Worried About Running Out of Food in the Last Year: Never true    Ran Out of Food in the Last Year: Never true  Transportation Needs: No Transportation Needs (03/12/2023)   Received from [facility], [facility]   PRAPARE - Transportation    Lack of Transportation (Medical): No    Lack of Transportation (Non-Medical): No  Physical Activity: Insufficiently Active (03/12/2023)   Received from [facility], [facility]   Exercise Vital Sign    Days of Exercise per Week: 3 days    Minutes of Exercise per Session: 20 min  Stress: Stress Concern Present (03/12/2023)   Received from [facility], [facility] of Occupational Health - Occupational Stress Questionnaire    Feeling of Stress : To some extent  Social Connections: Moderately Integrated (03/12/2023)   Received from [facility], [facility]   Social Connection and Isolation Panel [NHANES]    Frequency of Communication with Friends and Family: More than three times a week    Frequency of Social Gatherings  with Friends and Family: More than three times a week    Attends Religious Services: More than 4 times per year    Active Member of Clubs or Organizations: Yes    Attends Engineer, structural: More than 4 times per year    Marital Status: Never married  Intimate Partner Violence: Not At Risk (03/12/2023)   Received from [facility]   Humiliation, Afraid, Rape, and Kick questionnaire    Fear of Current or Ex-Partner: No    Emotionally Abused: No    Physically Abused: No    Sexually Abused: No    FH:  Family History  Problem Relation Age of Onset   Hypertension Mother    Hypertension Father      Past Medical History:  Diagnosis Date   Anemia    Arthritis    Chronic heart failure with preserved ejection fraction (HFpEF) (HCC)    a. 07/2020 Echo: EF 60-65%, no rwma, nl RV fxn, mildly dil LA, mild-mod AoV sclerosis.   Diabetes mellitus without complication (HCC)    Hypertension    PAF (paroxysmal atrial fibrillation) (HCC)    a. CHA2DS2VASc = 5-->eliquis; b. 08/2020 Zio: Predominantly sinus rhythm 6 bpm.  1% burden of PAF (longest 2 hours and 38 minutes at 121).    Current Outpatient Medications  Medication Sig Dispense Refill   acetaminophen (TYLENOL) 325 MG tablet Take 650 mg by mouth every 6 (six) hours as needed for mild pain or moderate pain.     apixaban (ELIQUIS) 5 MG TABS tablet Take 1 tablet (5 mg total) by mouth 2 (two) times daily. 60 tablet 5   ascorbic acid (VITAMIN C) 500 MG tablet Take 500 mg by mouth daily.     atorvastatin (LIPITOR) 40 MG tablet Take 1 tablet (40 mg total) by mouth every evening. 90 tablet 3   Calcium Carbonate-Vitamin D3 600-400 MG-UNIT TABS Take 1 tablet by mouth daily.     cephALEXin (KEFLEX) 500 MG capsule Take 500 mg by mouth 2 (two) times daily. Boils prevention     dapagliflozin propanediol (FARXIGA) 10 MG TABS tablet Take 1 tablet (10 mg total) by mouth daily before breakfast. 30 tablet 5   doxycycline (VIBRAMYCIN) 100 MG capsule Take 1 capsule (100 mg total) by mouth 2 (two) times daily. 20 capsule 0   folic acid (FOLVITE) 400 MCG tablet Take 400 mcg by mouth daily.     furosemide (LASIX) 20 MG tablet Take 1 tablet (20 mg total) by mouth daily. And additional 20mg  as needed 110 tablet 3   glipiZIDE (GLUCOTROL XL) 10 MG 24 hr tablet Take 10 mg by mouth daily.     losartan (COZAAR) 100 MG tablet Take 100 mg by mouth daily.     magnesium oxide (MAG-OX) 400 MG tablet Take 250 mg by mouth 2 (two) times daily.     metFORMIN (GLUCOPHAGE-XR) 500 MG 24 hr tablet Take 1,500 mg by mouth daily with supper.     metoprolol tartrate (LOPRESSOR) 50  MG tablet Take 50 mg by mouth 2 (two) times daily.     Multiple Vitamins-Minerals (MULTIVITAMIN WITH MINERALS) tablet Take 1 tablet by mouth daily.     TOUJEO SOLOSTAR 300 UNIT/ML Solostar Pen Inject 18 Units into the skin at bedtime.     TRULICITY 0.75 MG/0.5ML SOPN Inject 0.75 mg into the skin once a week. Mondays     verapamil (CALAN-SR) 120 MG CR tablet Take 1 tablet (120 mg total) by  mouth daily. 30 tablet 0   No current facility-administered medications for this visit.   Vitals:   05/01/23 1133  BP: (!) 117/56  Pulse: 77  SpO2: 98%  Weight: 208 lb (94.3 kg)  Height: 5\' 6"  (1.676 m)   Wt Readings from Last 3 Encounters:  05/01/23 208 lb (94.3 kg)  01/30/23 211 lb 8 oz (95.9 kg)  12/18/22 213 lb 12.8 oz (97 kg)   Lab Results  Component Value Date   CREATININE 0.94 09/04/2022   CREATININE 0.79 11/21/2021   CREATININE 0.66 09/01/2021   PHYSICAL EXAM:  General:  Well appearing. No resp difficulty HEENT: normal Neck: supple. JVP flat. No lymphadenopathy or thryomegaly appreciated. Cor: PMI normal. Regular rate & irregular rhythm. No rubs, gallops or murmurs. Lungs: clear Abdomen: soft, nontender, nondistended. No hepatosplenomegaly. No bruits or masses.  Extremities: no cyanosis, clubbing, rash, edema Neuro: alert & orientedx3, cranial nerves grossly intact. Moves all 4 extremities w/o difficulty. Affect pleasant.   ECG: AF with HR 72   ASSESSMENT & PLAN:  1: Chronic heart failure with preserved ejection fraction- - NYHA class II - euvolemic today - weighing daily; reminded to call for an overnight weight gain of > 2 pounds or a weekly weight gain of > 5 pounds - weight down 3 pounds from last visit here 3 months ago - not adding salt and has been diligent about reading food labels so that she can follow a low sodium diet  - continue farxiga 10mg  daily - continue losartan 100mg  daily - continue furosemide 20mg  daily - BNP 07/18/21 was 289.7  2: HTN- - BP  117/56 - saw PCP [facility]) 05/24; she requests that we call a [facility] PCP practice and get something scheduled; this has been scheduled for 06/25/23 @ [facility] - BMP 12/10/22 reviewed and showed sodium 138, potassium 3.9, creatinine 0.87 and GFR 71 - repeat BMP today  3: Type 2 DM- - A1c 03/12/23 was 8.0% - patient voices frustration that she is taking so many meds for diabetes and it's still uncontrolled; may benefit from endocrinology referral but will defer this to new PCP - glipizide 10mg  daily - metformin 1500mg  daily - toujeo and trulicity  4: Paroxysmal Atrial fibrillation- - previously wore Zio monitor November 2021 - zio 05/24: Atrial Fibrillation occurred continuously (100% burden), ranging from 35-149 bpm (avg of 68 bpm). Isolated VEs were rare (<1.0%), VE Couplets were rare (<1.0%), and no VE Triplets were present. Ventricular Bigeminy was present.  Conclusion: 1. Continuous atrial fibrillation, average rate 68. 2. Rare PVCs - currently rate controlled on verapamil and metoprolol tartrate - continue apixaban 5mg  BID - metoprolol tartrate 50mg  BID - verapamil 120mg  daily - EKG today: Atrial fibrillation - saw cardiology Mariah Milling) 08/23; will message him regarding her worsening palpitations and zio results   Return in 3 months, sooner if needed.

## 2023-06-04 NOTE — Progress Notes (Unsigned)
Cardiology Office Note    Date:  06/06/2023   ID:  Adamina, Lavelle 11/05/1950, MRN [id number]  PCP:  Micheal Likens, DO  Cardiologist:  Julien Nordmann, MD  Electrophysiologist:  None   Chief Complaint: Follow-up  History of Present Illness:   Karen Murphy is a 72 y.o. female with history of HFpEF, permanent A-fib on apixaban, aortic atherosclerosis, HTN, HLD, syncope felt to be in the setting of orthostasis and A-fib with RVR, DM 2, and rheumatoid arthritis who presents for follow-up of permanent A-fib.  She has historically been followed by the peer medicine service in the [facility] CHF clinic.  She was admitted to the hospital in 2021 with orthostatic syncope and A-fib with RVR, converting to sinus rhythm on diltiazem drip.  Echo in 2021 showed an EF of 60 to 65%, no regional wall motion abnormalities, normal LV diastolic function parameters, normal RV systolic function and ventricular cavity size, mildly dilated left atrium, aortic valve sclerosis without evidence of stenosis, and an estimated right atrial pressure of 3 mmHg.  Zio patch in 07/2020 showed a predominant rhythm of sinus with an average rate of 76 bpm and intermittent A-fib with a burden of 1% with the longest episode lasting 2 hours and 38 minutes with an average rate of 121 bpm.  She was last seen in the office in 05/2022 and was without symptoms of angina or cardiac decompensation.  She did note some shortness of breath and was relatively sedentary.  Prior EKGs have shown she was last in sinus rhythm in 11/2020.  All EKGs since have shown A-fib with controlled ventricular response.  Echo in 11/2022 showed an EF of 55 to 60%, no regional wall motion abnormalities, mild LVH, normal RV systolic function and ventricular cavity size, normal PASP, mild mitral regurgitation, aortic valve sclerosis without evidence of stenosis, and a normal right atrial pressure.  She recently underwent outpatient cardiac monitoring for  evaluation of palpitations at outside office.  Zio patch in 01/2023 showed continuous A-fib with a 100% burden with an average ventricular rate of 68 bpm (range 35 to 149 bpm), and rare PVCs.  She comes in today and is doing well from a cardiac perspective, without symptoms of angina or cardiac decompensation.  No further palpitations.  No lower extremity swelling, abdominal distention, PND, orthopnea, or early satiety.  No falls, hematochezia, or melena.  Monitoring her sodium intake.  Drinking less than 2 L of liquids daily.  She has been under increased stress recently with the help of her sister.  Adherent and tolerating cardiac medications.  She feels well at this time and does not have any acute cardiac concerns.   Labs independently reviewed: 04/2023 - potassium 4.1, BUN 27, serum creatinine 1.19 02/2023 - A1c 8.0 11/2022 - albumin 3.8, AST/ALT normal 11/2021 - Hgb 10.6, PLT 297 10/2020 - TC 129, TG 82, HDL 45, LDL 68 07/2020 - TSH normal  Past Medical History:  Diagnosis Date   Anemia    Arthritis    Chronic heart failure with preserved ejection fraction (HFpEF) (HCC)    a. 07/2020 Echo: EF 60-65%, no rwma, nl RV fxn, mildly dil LA, mild-mod AoV sclerosis.   Diabetes mellitus without complication (HCC)    Hypertension    PAF (paroxysmal atrial fibrillation) (HCC)    a. CHA2DS2VASc = 5-->eliquis; b. 08/2020 Zio: Predominantly sinus rhythm 6 bpm.  1% burden of PAF (longest 2 hours and 38 minutes at 121).    History reviewed. No pertinent  surgical history.  Current Medications: Current Meds  Medication Sig   acetaminophen (TYLENOL) 325 MG tablet Take 650 mg by mouth every 6 (six) hours as needed for mild pain or moderate pain.   apixaban (ELIQUIS) 5 MG TABS tablet Take 1 tablet (5 mg total) by mouth 2 (two) times daily.   ascorbic acid (VITAMIN C) 500 MG tablet Take 500 mg by mouth daily.   atorvastatin (LIPITOR) 40 MG tablet Take 1 tablet (40 mg total) by mouth every evening.    Calcium Carbonate-Vitamin D3 600-400 MG-UNIT TABS Take 1 tablet by mouth daily.   dapagliflozin propanediol (FARXIGA) 10 MG TABS tablet Take 1 tablet (10 mg total) by mouth daily before breakfast.   folic acid (FOLVITE) 400 MCG tablet Take 400 mcg by mouth daily.   furosemide (LASIX) 20 MG tablet Take 1 tablet (20 mg total) by mouth daily. And additional 20mg  as needed   glipiZIDE (GLUCOTROL XL) 10 MG 24 hr tablet Take 10 mg by mouth daily.   losartan (COZAAR) 100 MG tablet Take 100 mg by mouth daily.   magnesium oxide (MAG-OX) 400 MG tablet Take 250 mg by mouth 2 (two) times daily.   metFORMIN (GLUCOPHAGE-XR) 500 MG 24 hr tablet Take 1,500 mg by mouth daily with supper.   metoprolol tartrate (LOPRESSOR) 50 MG tablet Take 50 mg by mouth 2 (two) times daily.   Multiple Vitamins-Minerals (MULTIVITAMIN WITH MINERALS) tablet Take 1 tablet by mouth daily.   TOUJEO SOLOSTAR 300 UNIT/ML Solostar Pen Inject 18 Units into the skin at bedtime.   TRULICITY 0.75 MG/0.5ML SOPN Inject 0.75 mg into the skin once a week. Mondays   verapamil (CALAN-SR) 120 MG CR tablet Take 1 tablet (120 mg total) by mouth daily.    Allergies:   Diltiazem   Social History   Socioeconomic History   Marital status: Single    Spouse name: Not on file   Number of children: Not on file   Years of education: Not on file   Highest education level: Not on file  Occupational History   Not on file  Tobacco Use   Smoking status: Former   Smokeless tobacco: Never  Vaping Use   Vaping status: Former  Substance and Sexual Activity   Alcohol use: Not Currently   Drug use: Not on file   Sexual activity: Not on file  Other Topics Concern   Not on file  Social History Narrative   Not on file   Social Determinants of Health   Financial Resource Strain: Low Risk  (03/12/2023)   Received from [facility], [facility]   Overall Financial Resource Strain (CARDIA)    Difficulty of Paying Living Expenses: Not very hard   Food Insecurity: No Food Insecurity (03/12/2023)   Received from [facility], [facility]   Hunger Vital Sign    Worried About Running Out of Food in the Last Year: Never true    Ran Out of Food in the Last Year: Never true  Transportation Needs: No Transportation Needs (03/12/2023)   Received from [facility], [facility]   [facility] - Transportation    Lack of Transportation (Medical): No    Lack of Transportation (Non-Medical): No  Physical Activity: Insufficiently Active (03/12/2023)   Received from [facility], [facility]   Exercise Vital Sign    Days of Exercise per Week: 3 days    Minutes of Exercise per Session: 20 min  Stress: Stress Concern  Present (03/12/2023)   Received from [facility], [facility]   [facility] of Occupational Health - Occupational Stress Questionnaire    Feeling of Stress : To some extent  Social Connections: Moderately Integrated (03/12/2023)   Received from [facility], [facility]   Social Connection and Isolation Panel [NHANES]    Frequency of Communication with Friends and Family: More than three times a week    Frequency of Social Gatherings with Friends and Family: More than three times a week    Attends Religious Services: More than 4 times per year    Active Member of [employer] or Organizations: Yes    Attends Engineer, structural: More than 4 times per year    Marital Status: Never married     Family History:  The patient's family history includes Hypertension in her father and mother.  ROS:   12-point review of systems is negative unless otherwise noted in the HPI.   EKGs/Labs/Other Studies Reviewed:    Studies reviewed were summarized above. The additional studies were reviewed today:  Zio patch 01/2023: Atrial Fibrillation occurred continuously (100% burden), ranging from 35-149 bpm (avg of 68 bpm). Isolated VEs were rare (<1.0%), VE Couplets were rare (<1.0%), and no VE Triplets were  present. Ventricular Bigeminy was present.    Conclusion: 1. Continuous atrial fibrillation, average rate 68.  2. Rare PVCs __________  2D echo 12/13/2022: 1. Left ventricular ejection fraction, by estimation, is 55 to 60%. The  left ventricle has normal function. The left ventricle has no regional  wall motion abnormalities. There is mild left ventricular hypertrophy.  Left ventricular diastolic parameters  are indeterminate.   2. Right ventricular systolic function is normal. The right ventricular  size is normal. There is normal pulmonary artery systolic pressure. The  estimated right ventricular systolic pressure is 24.5 mmHg.   3. The mitral valve is normal in structure. Mild mitral valve  regurgitation. No evidence of mitral stenosis.   4. The aortic valve is tricuspid. There is mild calcification of the  aortic valve. Aortic valve regurgitation is not visualized. Aortic valve  sclerosis/calcification is present, without any evidence of aortic  stenosis. Aortic valve mean gradient  measures 4.0 mmHg.   5. The inferior vena cava is normal in size with greater than 50%  respiratory variability, suggesting right atrial pressure of 3 mmHg.  __________  [place] patch 07/2020: Normal sinus rhythm with an average heart rate of 76 bpm. Intermittent atrial fibrillation with a burden of 1%.  Longest episode lasted 2 hours and 38 minutes with an average heart rate of 121 bpm. __________  2D echo 07/25/2020: 1. Left ventricular ejection fraction, by estimation, is 60 to 65%. The  left ventricle has normal function. The left ventricle has no regional  wall motion abnormalities. Left ventricular diastolic parameters were  normal.   2. Right ventricular systolic function is normal. The right ventricular  size is normal.   3. Left atrial size was mildly dilated.   4. The mitral valve is normal in structure. No evidence of mitral valve  regurgitation. No evidence of mitral stenosis.   5.  The aortic valve is normal in structure. Aortic valve regurgitation is  not visualized. Mild to moderate aortic valve sclerosis/calcification is  present, without any evidence of aortic stenosis.   6. The inferior vena cava is normal in size with greater than 50%  respiratory variability, suggesting right atrial pressure of 3 mmHg.    EKG:  EKG is ordered today.  The EKG ordered today demonstrates A-fib, 70 bpm, low voltage QRS, no acute ST-T changes  Recent Labs: 05/01/2023: BUN 27; Creatinine, Ser 1.19; Potassium 4.1; Sodium 134  Recent Lipid Panel No results found for: "CHOL", "TRIG", "HDL", "CHOLHDL", "VLDL", "LDLCALC", "LDLDIRECT"  PHYSICAL EXAM:    VS:  BP 120/60 (BP Location: Left Arm, Patient Position: Sitting, Cuff Size: Normal)   Pulse 70   Ht 5\' 6"  (1.676 m)   Wt 208 lb 8 oz (94.6 kg)   SpO2 98%   BMI 33.65 kg/m   BMI: Body mass index is 33.65 kg/m.  Physical Exam Vitals reviewed.  Constitutional:      Appearance: She is well-developed.  HENT:     Head: Normocephalic and atraumatic.  Eyes:     General:        Right eye: No discharge.        Left eye: No discharge.  Neck:     Vascular: No JVD.  Cardiovascular:     Rate and Rhythm: Normal rate. Rhythm irregularly irregular.     Pulses:          Posterior tibial pulses are 2+ on the right side and 2+ on the left side.     Heart sounds: Normal heart sounds, S1 normal and S2 normal. Heart sounds not distant. No midsystolic click and no opening snap. No murmur heard.    No friction rub.  Pulmonary:     Effort: Pulmonary effort is normal. No respiratory distress.     Breath sounds: Normal breath sounds. No decreased breath sounds, wheezing or rales.  Chest:     Chest wall: No tenderness.  Abdominal:     General: There is no distension.  Musculoskeletal:     Cervical back: Normal range of motion.     Right lower leg: No edema.     Left lower leg: No edema.  Skin:    General: Skin is warm and dry.     Nails:  There is no clubbing.  Neurological:     Mental Status: She is alert and oriented to person, place, and time.  Psychiatric:        Speech: Speech normal.        Behavior: Behavior normal.        Thought Content: Thought content normal.        Judgment: Judgment normal.     Wt Readings from Last 3 Encounters:  06/06/23 208 lb 8 oz (94.6 kg)  05/01/23 208 lb (94.3 kg)  01/30/23 211 lb 8 oz (95.9 kg)     ASSESSMENT & PLAN:   Permanent A-fib: Asymptomatic with controlled ventricular response.  Continue Lopressor 50 mg twice daily.  CHA2DS2-VASc at least 6 (CHF, HTN, age x 1, DM, vascular disease, sex category).  She remains on apixaban 5 mg twice daily and does not meet reduced dosing criteria.  Recent labs stable.  No falls or symptoms concerning for bleeding.  Primary cardiologist is pursuing rate control strategy.  Low likelihood of restoring sinus rhythm with the patient having been in A-fib since at least 03/2021.  If rhythm strategy were pursued, would likely need antiarrhythmic therapy given duration of A-fib.  Patient feels asymptomatic at this time and would like to defer medication escalation with watchful waiting.  HFpEF: Euvolemic and well compensated.  Remains on furosemide 20 mg daily with an additional 20 mg as needed and [place].  Defer addition of MRA as her heart failure is followed  by [facility] CHF clinic.  However, this should be considered at her next visit with him.  HTN: Blood pressure is well-controlled in the office today.  Continue current medical therapy including losartan, Lopressor, and verapamil.  Aortic atherosclerosis/HLD: Remains on atorvastatin.    Disposition: F/u with Dr. Mariah Milling or an APP in 6 months.   Medication Adjustments/Labs and Tests Ordered: Current medicines are reviewed at length with the patient today.  Concerns regarding medicines are outlined above. Medication changes, Labs and Tests ordered today are summarized above and listed in the  Patient Instructions accessible in Encounters.   Signed, Eula Listen, PA-C 06/06/2023 4:27 PM     [facility] [place] [address] [place], [place] [address] [phone]

## 2023-06-06 ENCOUNTER — Encounter: Payer: Self-pay | Admitting: Physician Assistant

## 2023-06-06 ENCOUNTER — Ambulatory Visit: Payer: Medicare Other | Attending: Physician Assistant | Admitting: Physician Assistant

## 2023-06-06 VITALS — BP 120/60 | HR 70 | Ht 66.0 in | Wt 208.5 lb

## 2023-06-06 DIAGNOSIS — I700 Atherosclerosis of aorta: Secondary | ICD-10-CM | POA: Diagnosis present

## 2023-06-06 DIAGNOSIS — I5032 Chronic diastolic (congestive) heart failure: Secondary | ICD-10-CM | POA: Diagnosis present

## 2023-06-06 DIAGNOSIS — E785 Hyperlipidemia, unspecified: Secondary | ICD-10-CM | POA: Insufficient documentation

## 2023-06-06 DIAGNOSIS — I10 Essential (primary) hypertension: Secondary | ICD-10-CM | POA: Insufficient documentation

## 2023-06-06 DIAGNOSIS — I4821 Permanent atrial fibrillation: Secondary | ICD-10-CM | POA: Diagnosis present

## 2023-06-06 NOTE — Patient Instructions (Signed)
 Medication Instructions:   Your physician recommends that you continue on your current medications as directed. Please refer to the Current Medication list given to you today.  *If you need a refill on your cardiac medications before your next appointment, please call your pharmacy*   Lab Work:  None Ordered  If you have labs (blood work) drawn today and your tests are completely normal, you will receive your results only by: MyChart Message (if you have MyChart) OR A paper copy in the mail If you have any lab test that is abnormal or we need to change your treatment, we will call you to review the results.   Testing/Procedures:  None Ordered   Follow-Up: At [facility], you and your health needs are our priority.  As part of our continuing mission to provide you with exceptional heart care, we have created designated Provider Care Teams.  These Care Teams include your primary Cardiologist (physician) and Advanced Practice Providers (APPs -  Physician Assistants and Nurse Practitioners) who all work together to provide you with the care you need, when you need it.  We recommend signing up for the patient portal called "MyChart".  Sign up information is provided on this After Visit Summary.  MyChart is used to connect with patients for Virtual Visits (Telemedicine).  Patients are able to view lab/test results, encounter notes, upcoming appointments, etc.  Non-urgent messages can be sent to your provider as well.   To learn more about what you can do with MyChart, go to [email or web address].    Your next appointment:   6 month(s)  Provider:   You may see Julien Nordmann, MD or one of the following Advanced Practice Providers on your designated Care Team:   Nicolasa Ducking, NP Eula Listen, PA-C Cadence Fransico Michael, PA-C Charlsie Quest, NP

## 2023-06-25 ENCOUNTER — Ambulatory Visit: Payer: Self-pay | Admitting: Family Medicine

## 2023-08-01 NOTE — Progress Notes (Signed)
PCP: [facility] (last seen 09/24) Primary Cardiologist:Timothy Mariah Milling, MD (last seen 08/24)  HPI:  Karen Murphy is a 72 y/o female with a history of T2DM, HTN, anemia, permanent atrial fibrillation, RA, aortic atherosclerosis, hyperlipidemia, syncope thought to be due to orthostasis, previous tobacco use and chronic heart failure.   Admitted to the hospital in 2021 with orthostatic syncope and A-fib with RVR, converting to sinus rhythm on diltiazem drip.  Echo in 2021 showed an EF of 60 to 65%, no regional wall motion abnormalities, normal LV diastolic function parameters, normal RV systolic function and ventricular cavity size, mildly dilated left atrium, aortic valve sclerosis without evidence of stenosis, and an estimated right atrial pressure of 3 mmHg.    Zio patch in 07/2020 showed a predominant rhythm of sinus with an average rate of 76 bpm and intermittent A-fib with a burden of 1% with the longest episode lasting 2 hours and 38 minutes with an average rate of 121 bpm.   Zio 05/24: Atrial Fibrillation occurred continuously (100% burden), ranging from 35-149 bpm (avg of 68 bpm). Isolated VEs were rare (<1.0%), VE Couplets were rare (<1.0%), and no VE Triplets were present. Ventricular Bigeminy was present. Conclusion: Continuous atrial fibrillation, average rate 68 and rare PVCs  Was in the ED 10/13/22 due to URI. Was in the ED 03/26/23 due to buttock cellulitis.   Echo 07/25/20: EF of 60-65% along with mild LAE. Echo 12/13/22: EF of 55-60% along with mild LVH, normal PA pressure and mild MR.   She presents today for a HF follow-up visit with a chief complaint of minimal shortness of breath with moderate exertion. Does get short of breath when walking up into the office but recovers quickly. Has occasional palpitations, tingling in hands, feet pain at times and minimal fatigue along with this. Denies chest pain, cough, abdominal distention, pedal edema, dizziness, weight gain or  difficulty sleeping. Occasionally takes PRN dose of lasix if she notices any swelling in her legs.   ROS: All systems negative except as listed in HPI, PMH and Problem List.  SH:  Social History   Socioeconomic History   Marital status: Single    Spouse name: Not on file   Number of children: Not on file   Years of education: Not on file   Highest education level: Not on file  Occupational History   Not on file  Tobacco Use   Smoking status: Former   Smokeless tobacco: Never  Vaping Use   Vaping status: Former  Substance and Sexual Activity   Alcohol use: Not Currently   Drug use: Not on file   Sexual activity: Not on file  Other Topics Concern   Not on file  Social History Narrative   Not on file   Social Determinants of Health   Financial Resource Strain: Low Risk  (03/12/2023)   Received from [facility], [facility]   Overall Financial Resource Strain (CARDIA)    Difficulty of Paying Living Expenses: Not very hard  Food Insecurity: No Food Insecurity (03/12/2023)   Received from [facility], [facility]   Hunger Vital Sign    Worried About Running Out of Food in the Last Year: Never true    Ran Out of Food in the Last Year: Never true  Transportation Needs: No Transportation Needs (03/12/2023)   Received from [facility], [facility]   [facility] - Transportation    Lack of Transportation (Medical): No  Lack of Transportation (Non-Medical): No  Physical Activity: Insufficiently Active (03/12/2023)   Received from [facility], [facility]   Exercise Vital Sign    Days of Exercise per Week: 3 days    Minutes of Exercise per Session: 20 min  Stress: Stress Concern Present (03/12/2023)   Received from [facility], [facility] of Occupational Health - Occupational Stress Questionnaire    Feeling of Stress : To some extent  Social Connections: Moderately Integrated (03/12/2023)   Received from [facility],  [facility]   Social Connection and Isolation Panel [NHANES]    Frequency of Communication with Friends and Family: More than three times a week    Frequency of Social Gatherings with Friends and Family: More than three times a week    Attends Religious Services: More than 4 times per year    Active Member of [employer] or Organizations: Yes    Attends Engineer, structural: More than 4 times per year    Marital Status: Never married  Intimate Partner Violence: Not At Risk (03/12/2023)   Received from [facility], [facility]   Humiliation, Afraid, Rape, and Kick questionnaire    Fear of Current or Ex-Partner: No    Emotionally Abused: No    Physically Abused: No    Sexually Abused: No    FH:  Family History  Problem Relation Age of Onset   Hypertension Mother    Hypertension Father     Past Medical History:  Diagnosis Date   Anemia    Arthritis    Chronic heart failure with preserved ejection fraction (HFpEF) (HCC)    a. 07/2020 Echo: EF 60-65%, no rwma, nl RV fxn, mildly dil LA, mild-mod AoV sclerosis.   Diabetes mellitus without complication (HCC)    Hypertension    PAF (paroxysmal atrial fibrillation) (HCC)    a. CHA2DS2VASc = 5-->eliquis; b. 08/2020 Zio: Predominantly sinus rhythm 6 bpm.  1% burden of PAF (longest 2 hours and 38 minutes at 121).    Current Outpatient Medications  Medication Sig Dispense Refill   acetaminophen (TYLENOL) 325 MG tablet Take 650 mg by mouth every 6 (six) hours as needed for mild pain or moderate pain.     apixaban (ELIQUIS) 5 MG TABS tablet Take 1 tablet (5 mg total) by mouth 2 (two) times daily. 60 tablet 5   ascorbic acid (VITAMIN C) 500 MG tablet Take 500 mg by mouth daily.     atorvastatin (LIPITOR) 40 MG tablet Take 1 tablet (40 mg total) by mouth every evening. 90 tablet 3   Calcium Carbonate-Vitamin D3 600-400 MG-UNIT TABS Take 1 tablet by mouth daily.     cephALEXin (KEFLEX) 500 MG capsule Take 500 mg by mouth 2 (two)  times daily. Boils prevention (Patient not taking: Reported on 06/06/2023)     dapagliflozin propanediol (FARXIGA) 10 MG TABS tablet Take 1 tablet (10 mg total) by mouth daily before breakfast. 30 tablet 5   folic acid (FOLVITE) 400 MCG tablet Take 400 mcg by mouth daily.     furosemide (LASIX) 20 MG tablet Take 1 tablet (20 mg total) by mouth daily. And additional 20mg  as needed 110 tablet 3   glipiZIDE (GLUCOTROL XL) 10 MG 24 hr tablet Take 10 mg by mouth daily.     losartan (COZAAR) 100 MG tablet Take 100 mg by mouth daily.     magnesium oxide (MAG-OX) 400 MG tablet Take 250  mg by mouth 2 (two) times daily.     metFORMIN (GLUCOPHAGE-XR) 500 MG 24 hr tablet Take 1,500 mg by mouth daily with supper.     metoprolol tartrate (LOPRESSOR) 50 MG tablet Take 50 mg by mouth 2 (two) times daily.     Multiple Vitamins-Minerals (MULTIVITAMIN WITH MINERALS) tablet Take 1 tablet by mouth daily.     TOUJEO SOLOSTAR 300 UNIT/ML Solostar Pen Inject 18 Units into the skin at bedtime.     TRULICITY 0.75 MG/0.5ML SOPN Inject 0.75 mg into the skin once a week. Mondays     verapamil (CALAN-SR) 120 MG CR tablet Take 1 tablet (120 mg total) by mouth daily. 30 tablet 0   No current facility-administered medications for this visit.   Vitals:   08/02/23 1126  BP: 134/68  Pulse: 70  Resp: 14  SpO2: 97%  Weight: 211 lb (95.7 kg)   Wt Readings from Last 3 Encounters:  08/02/23 211 lb (95.7 kg)  06/06/23 208 lb 8 oz (94.6 kg)  05/01/23 208 lb (94.3 kg)   Lab Results  Component Value Date   CREATININE 1.19 (H) 05/01/2023   CREATININE 0.94 09/04/2022   CREATININE 0.79 11/21/2021   PHYSICAL EXAM:  General:  Well appearing. No resp difficulty HEENT: normal Neck: supple. JVP flat. No lymphadenopathy or thryomegaly appreciated. Cor: PMI normal. Regular rate & irregular rhythm. No rubs, gallops or murmurs. Lungs: clear Abdomen: soft, nontender, nondistended. No hepatosplenomegaly. No bruits or masses.   Extremities: no cyanosis, clubbing, rash, edema Neuro: alert & orientedx3, cranial nerves grossly intact. Moves all 4 extremities w/o difficulty. Affect pleasant.   ECG: not done   ASSESSMENT & PLAN:  1: NICM with preserved ejection fraction- - suspect due to HTN/ AF - NYHA class II - euvolemic today - weighing daily; reminded to call for an overnight weight gain of > 2 pounds or a weekly weight gain of > 5 pounds - weight up 3 pounds from last visit here 3 months ago - Echo 07/25/20: EF of 60-65% along with mild LAE. - Echo 12/13/22: EF of 55-60% along with mild LVH, normal PA pressure and mild MR.  - not adding salt and has been diligent about reading food labels so that she can follow a low sodium diet  - continue farxiga 10mg  daily - continue losartan 100mg  daily - continue furosemide 20mg  daily with additional 20mg  PRN - begin spironolactone 25mg  daily - BMET in 10 days & then again at next appt - BNP 07/18/21 was 289.7  2: HTN- - BP 134/68 - saw PCP ([facility]) 09/24 - BMP 06/25/23 reviewed and showed sodium 140, potassium 3.9, creatinine 0.88 and GFR 69  3: Type 2 DM- - A1c 06/25/23 was 6.9% - continue glipizide XL 10mg  daily - continue metformin XR 1500mg  daily - continue toujeo and trulicity  4: Paroxysmal Atrial fibrillation- - previously wore Zio monitor November 2021 & she was in NSR with intermittent AF - zio 05/24: Atrial Fibrillation occurred continuously (100% burden), ranging from 35-149 bpm (avg of 68 bpm). Isolated VEs were rare (<1.0%), VE Couplets were rare (<1.0%), and no VE Triplets were present. Ventricular Bigeminy was present.  Conclusion: 1. Continuous atrial fibrillation, average rate 68. 2. Rare PVCs - low likelihood of restoring sinus rhythm with the patient having been in A-fib since at least 03/2021. If rhythm strategy were pursued, would likely need antiarrhythmic therapy given duration of A-fib  - saw cardiology ([facility]) 08/24 - currently rate  controlled on verapamil and  metoprolol tartrate - continue apixaban 5mg  BID - continue metoprolol tartrate 50mg  BID - continue verapamil 120mg  daily   Return in 1 month, sooner if needed.

## 2023-08-02 ENCOUNTER — Encounter: Payer: Self-pay | Admitting: Family

## 2023-08-02 ENCOUNTER — Ambulatory Visit: Payer: Medicare Other | Attending: Family | Admitting: Family

## 2023-08-02 VITALS — BP 134/68 | HR 70 | Resp 14 | Wt 211.0 lb

## 2023-08-02 DIAGNOSIS — I110 Hypertensive heart disease with heart failure: Secondary | ICD-10-CM | POA: Diagnosis not present

## 2023-08-02 DIAGNOSIS — Z7901 Long term (current) use of anticoagulants: Secondary | ICD-10-CM | POA: Insufficient documentation

## 2023-08-02 DIAGNOSIS — I5033 Acute on chronic diastolic (congestive) heart failure: Secondary | ICD-10-CM

## 2023-08-02 DIAGNOSIS — I493 Ventricular premature depolarization: Secondary | ICD-10-CM | POA: Diagnosis not present

## 2023-08-02 DIAGNOSIS — Z87891 Personal history of nicotine dependence: Secondary | ICD-10-CM | POA: Diagnosis not present

## 2023-08-02 DIAGNOSIS — Z79899 Other long term (current) drug therapy: Secondary | ICD-10-CM | POA: Diagnosis not present

## 2023-08-02 DIAGNOSIS — I5032 Chronic diastolic (congestive) heart failure: Secondary | ICD-10-CM | POA: Diagnosis not present

## 2023-08-02 DIAGNOSIS — Z794 Long term (current) use of insulin: Secondary | ICD-10-CM | POA: Diagnosis not present

## 2023-08-02 DIAGNOSIS — I428 Other cardiomyopathies: Secondary | ICD-10-CM | POA: Diagnosis present

## 2023-08-02 DIAGNOSIS — R008 Other abnormalities of heart beat: Secondary | ICD-10-CM | POA: Diagnosis not present

## 2023-08-02 DIAGNOSIS — E119 Type 2 diabetes mellitus without complications: Secondary | ICD-10-CM | POA: Insufficient documentation

## 2023-08-02 DIAGNOSIS — Z7985 Long-term (current) use of injectable non-insulin antidiabetic drugs: Secondary | ICD-10-CM | POA: Diagnosis not present

## 2023-08-02 DIAGNOSIS — Z7984 Long term (current) use of oral hypoglycemic drugs: Secondary | ICD-10-CM | POA: Diagnosis not present

## 2023-08-02 MED ORDER — SPIRONOLACTONE 25 MG PO TABS: 25.0000 mg | ORAL_TABLET | Freq: Every day | ORAL | 1 refills | Status: DC

## 2023-08-02 NOTE — Patient Instructions (Addendum)
Have your blood work completed in [place].  START SPIRONOLACTONE 25 MG ONCE DAILY

## 2023-09-06 ENCOUNTER — Encounter: Payer: Medicare Other | Admitting: Family

## 2023-09-06 NOTE — Progress Notes (Deleted)
PCP: [facility] (last seen 09/24) Primary Cardiologist:Timothy Mariah Milling, MD (last seen 08/24)  HPI:  Karen Murphy is a 72 y/o female with a history of T2DM, HTN, anemia, permanent atrial fibrillation, RA, aortic atherosclerosis, hyperlipidemia, syncope thought to be due to orthostasis, previous tobacco use and chronic heart failure.   Admitted to the hospital in 2021 with orthostatic syncope and A-fib with RVR, converting to sinus rhythm on diltiazem drip.  Echo in 2021 showed an EF of 60 to 65%, no regional wall motion abnormalities, normal LV diastolic function parameters, normal RV systolic function and ventricular cavity size, mildly dilated left atrium, aortic valve sclerosis without evidence of stenosis, and an estimated right atrial pressure of 3 mmHg.    Zio patch in 07/2020 showed a predominant rhythm of sinus with an average rate of 76 bpm and intermittent A-fib with a burden of 1% with the longest episode lasting 2 hours and 38 minutes with an average rate of 121 bpm.   Zio 05/24: Atrial Fibrillation occurred continuously (100% burden), ranging from 35-149 bpm (avg of 68 bpm). Isolated VEs were rare (<1.0%), VE Couplets were rare (<1.0%), and no VE Triplets were present. Ventricular Bigeminy was present. Conclusion: Continuous atrial fibrillation, average rate 68 and rare PVCs  Was in the ED 10/13/22 due to URI. Was in the ED 03/26/23 due to buttock cellulitis.   Echo 07/25/20: EF of 60-65% along with mild LAE. Echo 12/13/22: EF of 55-60% along with mild LVH, normal PA pressure and mild MR.   She presents today for a HF follow-up visit with a chief complaint of minimal shortness of breath with moderate exertion. Does get short of breath when walking up into the office but recovers quickly. Has occasional palpitations, tingling in hands, feet pain at times and minimal fatigue along with this. Denies chest pain, cough, abdominal distention, pedal edema, dizziness, weight gain or  difficulty sleeping. Occasionally takes PRN dose of lasix if she notices any swelling in her legs.   ROS: All systems negative except as listed in HPI, PMH and Problem List.  SH:  Social History   Socioeconomic History   Marital status: Single    Spouse name: Not on file   Number of children: Not on file   Years of education: Not on file   Highest education level: Not on file  Occupational History   Not on file  Tobacco Use   Smoking status: Former   Smokeless tobacco: Never  Vaping Use   Vaping status: Former  Substance and Sexual Activity   Alcohol use: Not Currently   Drug use: Not on file   Sexual activity: Not on file  Other Topics Concern   Not on file  Social History Narrative   Not on file   Social Determinants of Health   Financial Resource Strain: Low Risk  (03/12/2023)   [facility], [facility]   Overall Financial Resource Strain (CARDIA)    Difficulty of Paying Living Expenses: Not very hard  Food Insecurity: No Food Insecurity (03/12/2023)   Received from [facility], [facility]   Hunger Vital Sign    Worried About Running Out of Food in the Last Year: Never true    Ran Out of Food in the Last Year: Never true  Transportation Needs: No Transportation Needs (03/12/2023)   Received from [facility], [facility]   [facility] - Transportation    Lack of Transportation (Medical): No  Lack of Transportation (Non-Medical): No  Physical Activity: Insufficiently Active (03/12/2023)   Received from [facility], [facility]   Exercise Vital Sign    Days of Exercise per Week: 3 days    Minutes of Exercise per Session: 20 min  Stress: Stress Concern Present (03/12/2023)   Received from [facility], [facility] of Occupational Health - Occupational Stress Questionnaire    Feeling of Stress : To some extent  Social Connections: Moderately Integrated (03/12/2023)   Received from [facility],  [facility]   Social Connection and Isolation Panel [NHANES]    Frequency of Communication with Friends and Family: More than three times a week    Frequency of Social Gatherings with Friends and Family: More than three times a week    Attends Religious Services: More than 4 times per year    Active Member of [employer] or Organizations: Yes    Attends Engineer, structural: More than 4 times per year    Marital Status: Never married  Intimate Partner Violence: Not At Risk (03/12/2023)   Received from [facility], [facility]   Humiliation, Afraid, Rape, and Kick questionnaire    Fear of Current or Ex-Partner: No    Emotionally Abused: No    Physically Abused: No    Sexually Abused: No    FH:  Family History  Problem Relation Age of Onset   Hypertension Mother    Hypertension Father     Past Medical History:  Diagnosis Date   Anemia    Arthritis    Chronic heart failure with preserved ejection fraction (HFpEF) (HCC)    a. 07/2020 Echo: EF 60-65%, no rwma, nl RV fxn, mildly dil LA, mild-mod AoV sclerosis.   Diabetes mellitus without complication (HCC)    Hypertension    PAF (paroxysmal atrial fibrillation) (HCC)    a. CHA2DS2VASc = 5-->eliquis; b. 08/2020 Zio: Predominantly sinus rhythm 6 bpm.  1% burden of PAF (longest 2 hours and 38 minutes at 121).    Current Outpatient Medications  Medication Sig Dispense Refill   acetaminophen (TYLENOL) 325 MG tablet Take 650 mg by mouth every 6 (six) hours as needed for mild pain or moderate pain.     apixaban (ELIQUIS) 5 MG TABS tablet Take 1 tablet (5 mg total) by mouth 2 (two) times daily. 60 tablet 5   ascorbic acid (VITAMIN C) 500 MG tablet Take 500 mg by mouth daily.     atorvastatin (LIPITOR) 40 MG tablet Take 1 tablet (40 mg total) by mouth every evening. 90 tablet 3   Calcium Carbonate-Vitamin D3 600-400 MG-UNIT TABS Take 1 tablet by mouth daily.     dapagliflozin propanediol (FARXIGA) 10 MG TABS tablet Take 1  tablet (10 mg total) by mouth daily before breakfast. 30 tablet 5   folic acid (FOLVITE) 400 MCG tablet Take 400 mcg by mouth daily.     furosemide (LASIX) 20 MG tablet Take 1 tablet (20 mg total) by mouth daily. And additional 20mg  as needed 110 tablet 3   glipiZIDE (GLUCOTROL XL) 10 MG 24 hr tablet Take 10 mg by mouth daily.     losartan (COZAAR) 100 MG tablet Take 100 mg by mouth daily.     magnesium oxide (MAG-OX) 400 MG tablet Take 250 mg by mouth 2 (two) times daily.     metFORMIN (GLUCOPHAGE-XR) 500 MG 24 hr tablet Take 1,500 mg by mouth daily with supper.  metoprolol tartrate (LOPRESSOR) 50 MG tablet Take 50 mg by mouth 2 (two) times daily.     Multiple Vitamins-Minerals (MULTIVITAMIN WITH MINERALS) tablet Take 1 tablet by mouth daily.     spironolactone (ALDACTONE) 25 MG tablet Take 1 tablet (25 mg total) by mouth daily. 90 tablet 1   TOUJEO SOLOSTAR 300 UNIT/ML Solostar Pen Inject 18 Units into the skin at bedtime.     TRULICITY 0.75 MG/0.5ML SOPN Inject 0.75 mg into the skin once a week. Mondays     verapamil (CALAN-SR) 120 MG CR tablet Take 1 tablet (120 mg total) by mouth daily. 30 tablet 0   No current facility-administered medications for this visit.   There were no vitals filed for this visit.  Wt Readings from Last 3 Encounters:  08/02/23 211 lb (95.7 kg)  06/06/23 208 lb 8 oz (94.6 kg)  05/01/23 208 lb (94.3 kg)   Lab Results  Component Value Date   CREATININE 1.19 (H) 05/01/2023   CREATININE 0.94 09/04/2022   CREATININE 0.79 11/21/2021   PHYSICAL EXAM:  General:  Well appearing. No resp difficulty HEENT: normal Neck: supple. JVP flat. No lymphadenopathy or thryomegaly appreciated. Cor: PMI normal. Regular rate & irregular rhythm. No rubs, gallops or murmurs. Lungs: clear Abdomen: soft, nontender, nondistended. No hepatosplenomegaly. No bruits or masses.  Extremities: no cyanosis, clubbing, rash, edema Neuro: alert & orientedx3, cranial nerves grossly  intact. Moves all 4 extremities w/o difficulty. Affect pleasant.   ECG: not done   ASSESSMENT & PLAN:  1: NICM with preserved ejection fraction- - suspect due to HTN/ AF - NYHA class II - euvolemic today - weighing daily; reminded to call for an overnight weight gain of > 2 pounds or a weekly weight gain of > 5 pounds - weight up 3 pounds from last visit here 3 months ago - Echo 07/25/20: EF of 60-65% along with mild LAE. - Echo 12/13/22: EF of 55-60% along with mild LVH, normal PA pressure and mild MR.  - not adding salt and has been diligent about reading food labels so that she can follow a low sodium diet  - continue farxiga 10mg  daily - continue losartan 100mg  daily - continue furosemide 20mg  daily with additional 20mg  PRN - begin spironolactone 25mg  daily - BMET in 10 days & then again at next appt - BNP 07/18/21 was 289.7  2: HTN- - BP 134/68 - saw PCP ([facility]) 09/24 - BMP 06/25/23 reviewed and showed sodium 140, potassium 3.9, creatinine 0.88 and GFR 69  3: Type 2 DM- - A1c 06/25/23 was 6.9% - continue glipizide XL 10mg  daily - continue metformin XR 1500mg  daily - continue toujeo and trulicity  4: Paroxysmal Atrial fibrillation- - previously wore Zio monitor November 2021 & she was in NSR with intermittent AF - zio 05/24: Atrial Fibrillation occurred continuously (100% burden), ranging from 35-149 bpm (avg of 68 bpm). Isolated VEs were rare (<1.0%), VE Couplets were rare (<1.0%), and no VE Triplets were present. Ventricular Bigeminy was present.  Conclusion: 1. Continuous atrial fibrillation, average rate 68. 2. Rare PVCs - low likelihood of restoring sinus rhythm with the patient having been in A-fib since at least 03/2021. If rhythm strategy were pursued, would likely need antiarrhythmic therapy given duration of A-fib  - saw cardiology ([facility]) 08/24 - currently rate controlled on verapamil and metoprolol tartrate - continue apixaban 5mg  BID - continue metoprolol tartrate  50mg  BID - continue verapamil 120mg  daily   Return in 1 month, sooner if needed.

## 2023-09-19 ENCOUNTER — Telehealth: Payer: Self-pay | Admitting: Family

## 2023-09-19 NOTE — Telephone Encounter (Signed)
Pt confirmed appt for 09/20/23

## 2023-09-20 ENCOUNTER — Ambulatory Visit: Payer: Medicare Other | Attending: Family | Admitting: Family

## 2023-09-20 ENCOUNTER — Encounter: Payer: Self-pay | Admitting: Family

## 2023-09-20 VITALS — BP 132/67 | HR 72 | Wt 210.0 lb

## 2023-09-20 DIAGNOSIS — I10 Essential (primary) hypertension: Secondary | ICD-10-CM | POA: Diagnosis not present

## 2023-09-20 DIAGNOSIS — R008 Other abnormalities of heart beat: Secondary | ICD-10-CM | POA: Diagnosis not present

## 2023-09-20 DIAGNOSIS — Z7984 Long term (current) use of oral hypoglycemic drugs: Secondary | ICD-10-CM | POA: Insufficient documentation

## 2023-09-20 DIAGNOSIS — Z794 Long term (current) use of insulin: Secondary | ICD-10-CM

## 2023-09-20 DIAGNOSIS — I110 Hypertensive heart disease with heart failure: Secondary | ICD-10-CM | POA: Diagnosis not present

## 2023-09-20 DIAGNOSIS — Z7901 Long term (current) use of anticoagulants: Secondary | ICD-10-CM | POA: Diagnosis not present

## 2023-09-20 DIAGNOSIS — R0602 Shortness of breath: Secondary | ICD-10-CM | POA: Insufficient documentation

## 2023-09-20 DIAGNOSIS — R5383 Other fatigue: Secondary | ICD-10-CM | POA: Insufficient documentation

## 2023-09-20 DIAGNOSIS — Z87891 Personal history of nicotine dependence: Secondary | ICD-10-CM | POA: Insufficient documentation

## 2023-09-20 DIAGNOSIS — Z79899 Other long term (current) drug therapy: Secondary | ICD-10-CM | POA: Diagnosis not present

## 2023-09-20 DIAGNOSIS — E785 Hyperlipidemia, unspecified: Secondary | ICD-10-CM | POA: Diagnosis not present

## 2023-09-20 DIAGNOSIS — I428 Other cardiomyopathies: Secondary | ICD-10-CM | POA: Diagnosis not present

## 2023-09-20 DIAGNOSIS — Z7985 Long-term (current) use of injectable non-insulin antidiabetic drugs: Secondary | ICD-10-CM | POA: Insufficient documentation

## 2023-09-20 DIAGNOSIS — I5032 Chronic diastolic (congestive) heart failure: Secondary | ICD-10-CM | POA: Diagnosis not present

## 2023-09-20 DIAGNOSIS — R002 Palpitations: Secondary | ICD-10-CM | POA: Insufficient documentation

## 2023-09-20 DIAGNOSIS — E119 Type 2 diabetes mellitus without complications: Secondary | ICD-10-CM | POA: Diagnosis not present

## 2023-09-20 DIAGNOSIS — I4821 Permanent atrial fibrillation: Secondary | ICD-10-CM | POA: Diagnosis not present

## 2023-09-20 MED ORDER — DAPAGLIFLOZIN PROPANEDIOL 10 MG PO TABS: 10.0000 mg | ORAL_TABLET | Freq: Every day | ORAL | 11 refills | Status: AC

## 2023-09-20 NOTE — Progress Notes (Signed)
PCP: [facility] (last seen 09/24) Primary Cardiologist:Timothy Mariah Milling, MD (last seen 08/24)  HPI:  Karen Murphy is a 72 y/o female with a history of T2DM, HTN, anemia, permanent atrial fibrillation, RA, aortic atherosclerosis, hyperlipidemia, syncope thought to be due to orthostasis, previous tobacco use and chronic heart failure.   Admitted to the hospital in 2021 with orthostatic syncope and A-fib with RVR, converting to sinus rhythm on diltiazem drip.  Echo in 2021 showed an EF of 60 to 65%, no regional wall motion abnormalities, normal LV diastolic function parameters, normal RV systolic function and ventricular cavity size, mildly dilated left atrium, aortic valve sclerosis without evidence of stenosis, and an estimated right atrial pressure of 3 mmHg.    Zio patch in 07/2020 showed a predominant rhythm of sinus with an average rate of 76 bpm and intermittent A-fib with a burden of 1% with the longest episode lasting 2 hours and 38 minutes with an average rate of 121 bpm.   Echo 07/25/20: EF of 60-65% along with mild LAE.  Zio 05/24: Atrial Fibrillation occurred continuously (100% burden), ranging from 35-149 bpm (avg of 68 bpm). Isolated VEs were rare (<1.0%), VE Couplets were rare (<1.0%), and no VE Triplets were present. Ventricular Bigeminy was present. Conclusion: Continuous atrial fibrillation, average rate 68 and rare PVCs  Was in the ED 10/13/22 due to URI. Was in the ED 03/26/23 due to buttock cellulitis.   Echo 12/13/22: EF of 55-60% along with mild LVH, normal PA pressure and mild MR.   She presents today for a HF follow-up visit with a chief complaint of moderate SOB with minimal exertion. Chronic in nature although seems to be a little worse with the cold weather. Has associated fatigue and occasional palpitations along with this. Denies chest pain, cough, abdominal distention, pedal edema, dizziness or weight gain. Sleeping well on 1 pillow  At last visit,  spironolactone 25mg  daily was started but after reading about side effects, she didn't start it due to her concern about getting cancer.   ROS: All systems negative except as listed in HPI, PMH and Problem List.  SH:  Social History   Socioeconomic History   Marital status: Single    Spouse name: Not on file   Number of children: Not on file   Years of education: Not on file   Highest education level: Not on file  Occupational History   Not on file  Tobacco Use   Smoking status: Former   Smokeless tobacco: Never  Vaping Use   Vaping status: Former  Substance and Sexual Activity   Alcohol use: Not Currently   Drug use: Not on file   Sexual activity: Not on file  Other Topics Concern   Not on file  Social History Narrative   Not on file   Social Determinants of Health   Financial Resource Strain: Low Risk  (03/12/2023)   Received from [facility], [facility]   Overall Financial Resource Strain (CARDIA)    Difficulty of Paying Living Expenses: Not very hard  Food Insecurity: No Food Insecurity (03/12/2023)   Received from [facility], [facility]   Hunger Vital Sign    Worried About Running Out of Food in the Last Year: Never true    Ran Out of Food in the Last Year: Never true  Transportation Needs: No Transportation Needs (03/12/2023)   Received from [facility], [facility]   [facility] - Transportation  Lack of Transportation (Medical): No    Lack of Transportation (Non-Medical): No  Physical Activity: Insufficiently Active (03/12/2023)   Received from [facility], [facility]   Exercise Vital Sign    Days of Exercise per Week: 3 days    Minutes of Exercise per Session: 20 min  Stress: Stress Concern Present (03/12/2023)   Received from [facility], [facility] of Occupational Health - Occupational Stress Questionnaire    Feeling of Stress : To some extent  Social Connections: Moderately Integrated  (03/12/2023)   Received from [facility], [facility]   Social Connection and Isolation Panel [NHANES]    Frequency of Communication with Friends and Family: More than three times a week    Frequency of Social Gatherings with Friends and Family: More than three times a week    Attends Religious Services: More than 4 times per year    Active Member of [employer] or Organizations: Yes    Attends Engineer, structural: More than 4 times per year    Marital Status: Never married  Intimate Partner Violence: Not At Risk (03/12/2023)   Received from [facility], [facility]   Humiliation, Afraid, Rape, and Kick questionnaire    Fear of Current or Ex-Partner: No    Emotionally Abused: No    Physically Abused: No    Sexually Abused: No    FH:  Family History  Problem Relation Age of Onset   Hypertension Mother    Hypertension Father     Past Medical History:  Diagnosis Date   Anemia    Arthritis    Chronic heart failure with preserved ejection fraction (HFpEF) (HCC)    a. 07/2020 Echo: EF 60-65%, no rwma, nl RV fxn, mildly dil LA, mild-mod AoV sclerosis.   Diabetes mellitus without complication (HCC)    Hypertension    PAF (paroxysmal atrial fibrillation) (HCC)    a. CHA2DS2VASc = 5-->eliquis; b. 08/2020 Zio: Predominantly sinus rhythm 6 bpm.  1% burden of PAF (longest 2 hours and 38 minutes at 121).    Current Outpatient Medications  Medication Sig Dispense Refill   acetaminophen (TYLENOL) 325 MG tablet Take 650 mg by mouth every 6 (six) hours as needed for mild pain or moderate pain.     apixaban (ELIQUIS) 5 MG TABS tablet Take 1 tablet (5 mg total) by mouth 2 (two) times daily. 60 tablet 5   ascorbic acid (VITAMIN C) 500 MG tablet Take 500 mg by mouth daily.     atorvastatin (LIPITOR) 40 MG tablet Take 1 tablet (40 mg total) by mouth every evening. 90 tablet 3   Calcium Carbonate-Vitamin D3 600-400 MG-UNIT TABS Take 1 tablet by mouth daily.     dapagliflozin  propanediol (FARXIGA) 10 MG TABS tablet Take 1 tablet (10 mg total) by mouth daily before breakfast. 30 tablet 5   folic acid (FOLVITE) 400 MCG tablet Take 400 mcg by mouth daily.     furosemide (LASIX) 20 MG tablet Take 1 tablet (20 mg total) by mouth daily. And additional 20mg  as needed 110 tablet 3   glipiZIDE (GLUCOTROL XL) 10 MG 24 hr tablet Take 10 mg by mouth daily.     losartan (COZAAR) 100 MG tablet Take 100 mg by mouth daily.     magnesium oxide (MAG-OX) 400 MG tablet Take 250 mg by mouth 2 (two) times daily.     metFORMIN (GLUCOPHAGE-XR) 500 MG 24 hr tablet  Take 1,500 mg by mouth daily with supper.     metoprolol tartrate (LOPRESSOR) 50 MG tablet Take 50 mg by mouth 2 (two) times daily.     Multiple Vitamins-Minerals (MULTIVITAMIN WITH MINERALS) tablet Take 1 tablet by mouth daily.     spironolactone (ALDACTONE) 25 MG tablet Take 1 tablet (25 mg total) by mouth daily. 90 tablet 1   TOUJEO SOLOSTAR 300 UNIT/ML Solostar Pen Inject 18 Units into the skin at bedtime.     TRULICITY 0.75 MG/0.5ML SOPN Inject 0.75 mg into the skin once a week. Mondays     verapamil (CALAN-SR) 120 MG CR tablet Take 1 tablet (120 mg total) by mouth daily. 30 tablet 0   No current facility-administered medications for this visit.   Vitals:   09/20/23 1109  BP: 132/67  Pulse: 72  SpO2: 95%  Weight: 210 lb (95.3 kg)   Wt Readings from Last 3 Encounters:  09/20/23 210 lb (95.3 kg)  08/02/23 211 lb (95.7 kg)  06/06/23 208 lb 8 oz (94.6 kg)   Lab Results  Component Value Date   CREATININE 1.19 (H) 05/01/2023   CREATININE 0.94 09/04/2022   CREATININE 0.79 11/21/2021   PHYSICAL EXAM:  General:  Well appearing. No resp difficulty HEENT: normal Neck: supple. JVP flat. No lymphadenopathy or thryomegaly appreciated. Cor: PMI normal. Regular rate & irregular rhythm. No rubs, gallops or murmurs. Lungs: clear Abdomen: soft, nontender, nondistended. No hepatosplenomegaly. No bruits or masses.   Extremities: no cyanosis, clubbing, rash, edema Neuro: alert & orientedx3, cranial nerves grossly intact. Moves all 4 extremities w/o difficulty. Affect pleasant.   ECG: not done   ASSESSMENT & PLAN:  1: NICM with preserved ejection fraction- - suspect due to HTN/ AF - NYHA class III - euvolemic today - weighing daily; reminded to call for an overnight weight gain of > 2 pounds or a weekly weight gain of > 5 pounds - weight down 1 pound from last visit here 6 weeks ago - Echo 07/25/20: EF of 60-65% along with mild LAE. - Echo 12/13/22: EF of 55-60% along with mild LVH, normal PA pressure and mild MR.  - not adding salt and has been diligent about reading food labels so that she can follow a low sodium diet  - continue farxiga 10mg  daily; refilled today - continue furosemide 20mg  daily with additional 20mg  PRN - continue losartan 100mg  daily - she didn't start spironolactone because she was concerned about the potential cancer risks and doesn't want to do anything to potentially increase her risk - BNP 07/18/21 was 289.7  2: HTN- - BP 132/67 - saw PCP ([facility]) 09/24 - BMP 06/25/23 reviewed and showed sodium 140, potassium 3.9, creatinine 0.88 and GFR 69 - will not repeat labs today since she didn't start spironolactone and she sees her PCP next week  3: Type 2 DM- - A1c 06/25/23 was 6.9% - glucose was 135 @ home this morning - continue glipizide XL 10mg  daily - continue metformin XR 1500mg  daily - continue toujeo and trulicity  4: Paroxysmal Atrial fibrillation- - continue apixaban 5mg  BID - continue metoprolol tartrate 50mg  BID - continue verapamil 120mg  daily - previously wore Zio monitor November 2021 & she was in NSR with intermittent AF - zio 05/24: Atrial Fibrillation occurred continuously (100% burden), ranging from 35-149 bpm (avg of 68 bpm). Isolated VEs were rare (<1.0%), VE Couplets were rare (<1.0%), and no VE Triplets were present. Ventricular Bigeminy was present.   Conclusion: 1. Continuous atrial fibrillation,  average rate 68. 2. Rare PVCs - low likelihood of restoring sinus rhythm with the patient having been in A-fib since at least 03/2021. If rhythm strategy were pursued, would likely need antiarrhythmic therapy given duration of A-fib  - saw cardiology ([facility]) 08/24  5: Hyperlipidemia- - continue atorvastatin 40mg  daily - LDL 06/25/23 was 67; goal <70   Return in 5 months, sooner if needed.

## 2023-10-29 ENCOUNTER — Other Ambulatory Visit: Payer: Self-pay | Admitting: Family

## 2023-10-29 DIAGNOSIS — I480 Paroxysmal atrial fibrillation: Secondary | ICD-10-CM

## 2023-12-11 ENCOUNTER — Ambulatory Visit
Admission: EM | Admit: 2023-12-11 | Discharge: 2023-12-11 | Disposition: A | Discharge status: Home / Self Care | Payer: Medicare Other | Attending: Emergency Medicine | Admitting: Emergency Medicine

## 2023-12-11 ENCOUNTER — Encounter: Payer: Self-pay | Admitting: Emergency Medicine

## 2023-12-11 DIAGNOSIS — J069 Acute upper respiratory infection, unspecified: Secondary | ICD-10-CM | POA: Insufficient documentation

## 2023-12-11 LAB — GROUP A STREP BY PCR: Group A Strep by PCR: NOT DETECTED

## 2023-12-11 MED ORDER — IPRATROPIUM BROMIDE 0.06 % NA SOLN: 2.0000 | Freq: Four times a day (QID) | NASAL | 12 refills | Status: AC

## 2023-12-11 NOTE — ED Provider Notes (Signed)
 [facility]    CSN: [id number] Arrival date & time: 12/11/23  1048      History   Chief Complaint No chief complaint on file.   HPI Karen Murphy is a 73 y.o. female.   HPI  73 year old female with past medical history significant for atrial fibrillation, anemia, diabetes, arthritis, hypertension, acute on chronic congestive heart failure presents for evaluation of a loss of taste and smell that started 6 days ago.  She also reports that she has had a slight runny nose but she denies any fever or cough.  She is unaware of any sick contacts and denies any recent travel.  Past Medical History:  Diagnosis Date   Anemia    Arthritis    Chronic heart failure with preserved ejection fraction (HFpEF) (HCC)    a. 07/2020 Echo: EF 60-65%, no rwma, nl RV fxn, mildly dil LA, mild-mod AoV sclerosis.   Diabetes mellitus without complication (HCC)    Hypertension    PAF (paroxysmal atrial fibrillation) (HCC)    a. CHA2DS2VASc = 5-->eliquis; b. 08/2020 Zio: Predominantly sinus rhythm 6 bpm.  1% burden of PAF (longest 2 hours and 38 minutes at 121).    Patient Active Problem List   Diagnosis Date Noted   Acute on chronic diastolic CHF (congestive heart failure) (HCC) 11/21/2020   Flash pulmonary edema (HCC) 11/19/2020   AF (paroxysmal atrial fibrillation) (HCC) 11/19/2020   Atrial fibrillation with rapid ventricular response (HCC) 07/24/2020   Syncope and collapse 07/24/2020   Community acquired pneumonia of right lower lobe of lung    Essential hypertension    Acute respiratory failure (HCC) 04/13/2020   Lobar pneumonia (HCC)    Type 2 diabetes mellitus without complication, with long-term current use of insulin (HCC)     History reviewed. No pertinent surgical history.  OB History   No obstetric history on file.      Home Medications    Prior to Admission medications   Medication Sig Start Date End Date Taking? Authorizing Provider  apixaban (ELIQUIS) 5 MG TABS  tablet Take 1 tablet by mouth twice daily 10/30/23  Yes Hackney, Inetta Fermo A, FNP  ascorbic acid (VITAMIN C) 500 MG tablet Take 500 mg by mouth daily.   Yes [provider]  atorvastatin (LIPITOR) 40 MG tablet Take 1 tablet (40 mg total) by mouth every evening. 09/14/20  Yes Antonieta Iba, MD  dapagliflozin propanediol (FARXIGA) 10 MG TABS tablet Take 1 tablet (10 mg total) by mouth daily before breakfast. 09/20/23  Yes Clarisa Kindred A, FNP  folic acid (FOLVITE) 400 MCG tablet Take 400 mcg by mouth daily.   Yes [provider]  furosemide (LASIX) 20 MG tablet Take 1 tablet (20 mg total) by mouth daily. And additional 20mg  as needed 09/04/22  Yes Hackney, Inetta Fermo A, FNP  glipiZIDE (GLUCOTROL XL) 10 MG 24 hr tablet Take 10 mg by mouth daily. 02/03/20  Yes [provider]  ipratropium (ATROVENT) 0.06 % nasal spray Place 2 sprays into both nostrils 4 (four) times daily. 12/11/23  Yes Becky Augusta, NP  losartan (COZAAR) 100 MG tablet Take 100 mg by mouth daily.   Yes [provider]  magnesium oxide (MAG-OX) 400 MG tablet Take 250 mg by mouth 2 (two) times daily.   Yes [provider]  metFORMIN (GLUCOPHAGE-XR) 500 MG 24 hr tablet Take 1,500 mg by mouth daily with supper. 10/19/19  Yes [provider]  metoprolol tartrate (LOPRESSOR) 50 MG tablet Take 50  mg by mouth 2 (two) times daily.   Yes [provider]  Multiple Vitamins-Minerals (MULTIVITAMIN WITH MINERALS) tablet Take 1 tablet by mouth daily.   Yes [provider]  Semaglutide,0.25 or 0.5MG /DOS, 2 MG/3ML SOPN Inject into the skin. 11/25/23  Yes [provider]  TOUJEO SOLOSTAR 300 UNIT/ML Solostar Pen Inject 18 Units into the skin at bedtime.   Yes [provider]  verapamil (CALAN-SR) 120 MG CR tablet Take 1 tablet (120 mg total) by mouth daily. 04/23/22  Yes Hackney, Inetta Fermo A, FNP  acetaminophen (TYLENOL) 325 MG tablet Take 650 mg by mouth every 6 (six) hours as needed  for mild pain or moderate pain.    [provider]  TRULICITY 0.75 MG/0.5ML SOPN Inject 0.75 mg into the skin once a week. Mondays    [provider]    Family History Family History  Problem Relation Age of Onset   Hypertension Mother    Hypertension Father     Social History Social History   Tobacco Use   Smoking status: Former   Smokeless tobacco: Never  Advertising account planner   Vaping status: Former  Substance Use Topics   Alcohol use: Not Currently     Allergies   Diltiazem   Review of Systems Review of Systems  Constitutional:  Negative for fever.  HENT:  Positive for congestion and rhinorrhea.        Loss of taste and smell  Respiratory:  Negative for cough.      Physical Exam Triage Vital Signs ED Triage Vitals  Encounter Vitals Group     BP      Systolic BP Percentile      Diastolic BP Percentile      Pulse      Resp      Temp      Temp src      SpO2      Weight      Height      Head Circumference      Peak Flow      Pain Score      Pain Loc      Pain Education      Exclude from Growth Chart    No data found.  Updated Vital Signs BP 139/87 (BP Location: Left Arm)   Pulse (!) 59   Temp 98.6 F (37 C) (Oral)   Resp 16   SpO2 96%   Visual Acuity Right Eye Distance:   Left Eye Distance:   Bilateral Distance:    Right Eye Near:   Left Eye Near:    Bilateral Near:     Physical Exam Vitals and nursing note reviewed.  Constitutional:      Appearance: Normal appearance. She is not ill-appearing.  HENT:     Head: Normocephalic and atraumatic.     Right Ear: Tympanic membrane, ear canal and external ear normal. There is no impacted cerumen.     Left Ear: Tympanic membrane, ear canal and external ear normal. There is no impacted cerumen.     Nose: Congestion and rhinorrhea present.     Comments: Nasal mucosa is pale and edematous with scant clear discharge in both nares.    Mouth/Throat:     Mouth: Mucous membranes are moist.      Pharynx: Oropharynx is clear. Posterior oropharyngeal erythema present. No oropharyngeal exudate.     Comments: Tonsillar pillars are unremarkable.  Mild erythema to the posterior oropharynx with clear postnasal drip. Cardiovascular:  Rate and Rhythm: Normal rate and regular rhythm.     Pulses: Normal pulses.     Heart sounds: Normal heart sounds. No murmur heard.    No friction rub. No gallop.  Pulmonary:     Effort: Pulmonary effort is normal.     Breath sounds: Normal breath sounds. No wheezing, rhonchi or rales.  Musculoskeletal:     Cervical back: Normal range of motion and neck supple. No tenderness.  Lymphadenopathy:     Cervical: No cervical adenopathy.  Skin:    General: Skin is warm and dry.     Capillary Refill: Capillary refill takes less than 2 seconds.     Findings: No rash.  Neurological:     General: No focal deficit present.     Mental Status: She is alert and oriented to person, place, and time.      UC Treatments / Results  Labs (all labs ordered are listed, but only abnormal results are displayed) Labs Reviewed  GROUP A STREP BY PCR    EKG   Radiology No results found.  Procedures Procedures (including critical care time)  Medications Ordered in UC Medications - No data to display  Initial Impression / Assessment and Plan / UC Course  I have reviewed the triage vital signs and the nursing notes.  Pertinent labs & imaging results that were available during my care of the patient were reviewed by me and considered in my medical decision making (see chart for details).   Patient is a pleasant, nontoxic-appearing 63 old female presenting for evaluation of URI symptoms as outlined HPI above.  She does endorse a mild runny nose without significant nasal congestion.  She states that she feels warmth in the back of her throat and she feels as if she has just taken a drink of a warm beverage but does not experience any overt pain or pain with  swallowing.  She has not had a fever or cough.  Her physical exam does reveal inflamed nasal mucosa but they are pale in appearance with scant clear discharge in both nares.  Oropharyngeal exam does reveal mild erythema to the posterior oropharynx with clear postnasal drip.  Differential diagnose include COVID, influenza, viral respiratory illness, strep pharyngitis.  Given that she is on day 6 of symptoms she is outside the therapeutic window for antivirals so I will not test her for COVID and flu at this time.  I will order a strep PCR.  Strep PCR is negative.  I will discharge patient home with a diagnosis of viral URI with a prescription for Atrovent nasal spray to help with the nasal congestion runny nose.  She may use over-the-counter Tylenol and/or ibuprofen as needed for any fever or pain.  Return precautions reviewed.   Final Clinical Impressions(s) / UC Diagnoses   Final diagnoses:  Viral URI     Discharge Instructions      Your strep test today was negative.  As we discussed, you could possibly be suffering from COVID, influenza, or another respiratory virus.  Your nasal congestion may be contributing to your loss of taste and smell.  Use over-the-counter Tylenol and/or ibuprofen as needed for any fever or pain.  Use the Atrovent nasal spray, 2 squirts up each nostril every 6 hours, as needed for nasal congestion and runny nose.  If you develop any new or worsening symptoms other return for reevaluation or follow-up with your primary care provider.     ED Prescriptions  Medication Sig Dispense Auth. Provider   ipratropium (ATROVENT) 0.06 % nasal spray Place 2 sprays into both nostrils 4 (four) times daily. 15 mL Becky Augusta, NP      PDMP not reviewed this encounter.   Becky Augusta, NP 12/11/23 1302

## 2023-12-11 NOTE — Discharge Instructions (Addendum)
 Your strep test today was negative.  As we discussed, you could possibly be suffering from COVID, influenza, or another respiratory virus.  Your nasal congestion may be contributing to your loss of taste and smell.  Use over-the-counter Tylenol and/or ibuprofen as needed for any fever or pain.  Use the Atrovent nasal spray, 2 squirts up each nostril every 6 hours, as needed for nasal congestion and runny nose.  If you develop any new or worsening symptoms other return for reevaluation or follow-up with your primary care provider.

## 2023-12-11 NOTE — ED Triage Notes (Signed)
 SX SINCE FRIDAY  Loss of taste and smell Back of throat feels hot

## 2023-12-24 ENCOUNTER — Encounter: Payer: Self-pay | Admitting: Family

## 2023-12-24 ENCOUNTER — Other Ambulatory Visit: Payer: Self-pay

## 2023-12-24 MED ORDER — FUROSEMIDE 20 MG PO TABS: 20.0000 mg | ORAL_TABLET | Freq: Every day | ORAL | 10 refills | Status: DC

## 2024-02-20 NOTE — Progress Notes (Deleted)
 Advanced Heart Failure Clinic Note   PCP: [facility] (last seen 09/24) Primary Cardiologist:Timothy Jerelene Monday, MD (last seen 08/24)  HPI:  Karen Murphy is a 73 y/o female with a history of T2DM, HTN, anemia, permanent atrial fibrillation, RA, aortic atherosclerosis, hyperlipidemia, syncope thought to be due to orthostasis, previous tobacco use and chronic heart failure.   Admitted to the hospital in 2021 with orthostatic syncope and A-fib with RVR, converting to sinus rhythm on diltiazem  drip.  Echo in 2021 showed an EF of 60 to 65%, no regional wall motion abnormalities, normal LV diastolic function parameters, normal RV systolic function and ventricular cavity size, mildly dilated left atrium, aortic valve sclerosis without evidence of stenosis, and an estimated right atrial pressure of 3 mmHg.    Zio patch in 07/2020 showed a predominant rhythm of sinus with an average rate of 76 bpm and intermittent A-fib with a burden of 1% with the longest episode lasting 2 hours and 38 minutes with an average rate of 121 bpm.   Echo 07/25/20: EF of 60-65% along with mild LAE.  Zio 05/24: Atrial Fibrillation occurred continuously (100% burden), ranging from 35-149 bpm (avg of 68 bpm). Isolated VEs were rare (<1.0%), VE Couplets were rare (<1.0%), and no VE Triplets were present. Ventricular Bigeminy was present. Conclusion: Continuous atrial fibrillation, average rate 68 and rare PVCs  Was in the ED 10/13/22 due to URI. Was in the ED 03/26/23 due to buttock cellulitis.   Echo 12/13/22: EF of 55-60% along with mild LVH, normal PA pressure and mild MR.   She presents today for a HF follow-up visit with a chief complaint of moderate SOB with minimal exertion. Chronic in nature although seems to be a little worse with the cold weather. Has associated fatigue and occasional palpitations along with this. Denies chest pain, cough, abdominal distention, pedal edema, dizziness or weight gain. Sleeping  well on 1 pillow  At last visit, spironolactone  25mg  daily was started but after reading about side effects, she didn't start it due to her concern about getting cancer.   ROS: All systems negative except as listed in HPI, PMH and Problem List.  SH:  Social History   Socioeconomic History   Marital status: Single    Spouse name: Not on file   Number of children: Not on file   Years of education: Not on file   Highest education level: Not on file  Occupational History   Not on file  Tobacco Use   Smoking status: Former   Smokeless tobacco: Never  Vaping Use   Vaping status: Former  Substance and Sexual Activity   Alcohol use: Not Currently   Drug use: Not on file   Sexual activity: Not on file  Other Topics Concern   Not on file  Social History Narrative   Not on file   Social Drivers of Health   Financial Resource Strain: Low Risk  (03/12/2023)   Received from [facility], [facility]   Overall Financial Resource Strain (CARDIA)    Difficulty of Paying Living Expenses: Not very hard  Food Insecurity: No Food Insecurity (03/12/2023)   Received from [facility], [facility]   Hunger Vital Sign    Worried About Running Out of Food in the Last Year: Never true    Ran Out of Food in the Last Year: Never true  Transportation Needs: No Transportation Needs (03/12/2023)   Received from [facility], [facility]  PRAPARE - Administrator, Civil Service (Medical): No    Lack of Transportation (Non-Medical): No  Physical Activity: Insufficiently Active (03/12/2023)   Received from [facility], [facility]   Exercise Vital Sign    Days of Exercise per Week: 3 days    Minutes of Exercise per Session: 20 min  Stress: Stress Concern Present (03/12/2023)   Received from [facility], [facility] of [facility]ational [facility] - Occupational Stress Questionnaire    Feeling of Stress : To some extent  Social Connections:  Moderately Integrated (03/12/2023)   Received from [facility], [facility]   Social Connection and Isolation Panel [NHANES]    Frequency of Communication with Friends and Family: More than three times a week    Frequency of Social Gatherings with Friends and Family: More than three times a week    Attends Religious Services: More than 4 times per year    Active Member of [employer] or Organizations: Yes    Attends Engineer, structural: More than 4 times per year    Marital Status: Never married  Intimate Partner Violence: Not At Risk (03/12/2023)   Received from [facility], [facility]   Humiliation, Afraid, Rape, and Kick questionnaire    Fear of Current or Ex-Partner: No    Emotionally Abused: No    Physically Abused: No    Sexually Abused: No    FH:  Family History  Problem Relation Age of Onset   Hypertension Mother    Hypertension Father     Past Medical History:  Diagnosis Date   Anemia    Arthritis    Chronic heart failure with preserved ejection fraction (HFpEF) (HCC)    a. 07/2020 Echo: EF 60-65%, no rwma, nl RV fxn, mildly dil LA, mild-mod AoV sclerosis.   Diabetes mellitus without complication (HCC)    Hypertension    PAF (paroxysmal atrial fibrillation) (HCC)    a. CHA2DS2VASc = 5-->eliquis ; b. 08/2020 Zio: Predominantly sinus rhythm 6 bpm.  1% burden of PAF (longest 2 hours and 38 minutes at 121).    Current Outpatient Medications  Medication Sig Dispense Refill   acetaminophen  (TYLENOL ) 325 MG tablet Take 650 mg by mouth every 6 (six) hours as needed for mild pain or moderate pain.     apixaban  (ELIQUIS ) 5 MG TABS tablet Take 1 tablet by mouth twice daily 60 tablet 5   ascorbic acid  (VITAMIN C) 500 MG tablet Take 500 mg by mouth daily.     atorvastatin  (LIPITOR) 40 MG tablet Take 1 tablet (40 mg total) by mouth every evening. 90 tablet 3   dapagliflozin  propanediol (FARXIGA ) 10 MG TABS tablet Take 1 tablet (10 mg total) by mouth daily  before breakfast. 30 tablet 11   folic acid (FOLVITE) 400 MCG tablet Take 400 mcg by mouth daily.     furosemide  (LASIX ) 20 MG tablet Take 1 tablet (20 mg total) by mouth daily. And additional 20mg  as needed 30 tablet 10   glipiZIDE  (GLUCOTROL  XL) 10 MG 24 hr tablet Take 10 mg by mouth daily.     ipratropium (ATROVENT ) 0.06 % nasal spray Place 2 sprays into both nostrils 4 (four) times daily. 15 mL 12   losartan  (COZAAR ) 100 MG tablet Take 100 mg by mouth daily.     magnesium  oxide (MAG-OX) 400 MG tablet Take 250 mg by mouth 2 (two) times daily.     metFORMIN  (  GLUCOPHAGE -XR) 500 MG 24 hr tablet Take 1,500 mg by mouth daily with supper.     metoprolol  tartrate (LOPRESSOR ) 50 MG tablet Take 50 mg by mouth 2 (two) times daily.     Multiple Vitamins-Minerals (MULTIVITAMIN WITH MINERALS) tablet Take 1 tablet by mouth daily.     Semaglutide,0.25 or 0.5MG /DOS, 2 MG/3ML SOPN Inject into the skin.     TOUJEO  SOLOSTAR 300 UNIT/ML Solostar Pen Inject 18 Units into the skin at bedtime.     TRULICITY 0.75 MG/0.5ML SOPN Inject 0.75 mg into the skin once a week. Mondays     verapamil  (CALAN -SR) 120 MG CR tablet Take 1 tablet (120 mg total) by mouth daily. 30 tablet 0   No current facility-administered medications for this visit.   There were no vitals filed for this visit.  Wt Readings from Last 3 Encounters:  09/20/23 210 lb (95.3 kg)  08/02/23 211 lb (95.7 kg)  06/06/23 208 lb 8 oz (94.6 kg)   Lab Results  Component Value Date   CREATININE 1.19 (H) 05/01/2023   CREATININE 0.94 09/04/2022   CREATININE 0.79 11/21/2021   PHYSICAL EXAM:  General:  Well appearing. No resp difficulty HEENT: normal Neck: supple. JVP flat. No lymphadenopathy or thryomegaly appreciated. Cor: PMI normal. Regular rate & irregular rhythm. No rubs, gallops or murmurs. Lungs: clear Abdomen: soft, nontender, nondistended. No hepatosplenomegaly. No bruits or masses.  Extremities: no cyanosis, clubbing, rash, edema Neuro:  alert & orientedx3, cranial nerves grossly intact. Moves all 4 extremities w/o difficulty. Affect pleasant.   ECG: not done   ASSESSMENT & PLAN:  1: NICM with preserved ejection fraction- - suspect due to HTN/ AF - NYHA class III - euvolemic today - weighing daily; reminded to call for an overnight weight gain of > 2 pounds or a weekly weight gain of > 5 pounds - weight down 1 pound from last visit here 6 weeks ago - Echo 07/25/20: EF of 60-65% along with mild LAE. - Echo 12/13/22: EF of 55-60% along with mild LVH, normal PA pressure and mild MR.  - not adding salt and has been diligent about reading food labels so that she can follow a low sodium diet  - continue farxiga  10mg  daily; refilled today - continue furosemide  20mg  daily with additional 20mg  PRN - continue losartan  100mg  daily - she didn't start spironolactone  because she was concerned about the potential cancer risks and doesn't want to do anything to potentially increase her risk - BNP 07/18/21 was 289.7  2: HTN- - BP 132/67 - saw PCP ([facility]) 09/24 - BMP 06/25/23 reviewed and showed sodium 140, potassium 3.9, creatinine 0.88 and GFR 69 - will not repeat labs today since she didn't start spironolactone  and she sees her PCP next week  3: Type 2 DM- - A1c 06/25/23 was 6.9% - glucose was 135 @ home this morning - continue glipizide  XL 10mg  daily - continue metformin  XR 1500mg  daily - continue toujeo  and trulicity  4: Paroxysmal Atrial fibrillation- - continue apixaban  5mg  BID - continue metoprolol  tartrate 50mg  BID - continue verapamil  120mg  daily - previously wore Zio monitor November 2021 & she was in NSR with intermittent AF - zio 05/24: Atrial Fibrillation occurred continuously (100% burden), ranging from 35-149 bpm (avg of 68 bpm). Isolated VEs were rare (<1.0%), VE Couplets were rare (<1.0%), and no VE Triplets were present. Ventricular Bigeminy was present.  Conclusion: 1. Continuous atrial fibrillation, average  rate 68. 2. Rare PVCs - low likelihood of restoring sinus  rhythm with the patient having been in A-fib since at least 03/2021. If rhythm strategy were pursued, would likely need antiarrhythmic therapy given duration of A-fib  - saw cardiology ([facility]) 08/24  5: Hyperlipidemia- - continue atorvastatin  40mg  daily - LDL 06/25/23 was 67; goal <70   Return in 5 months, sooner if needed.      Charlette Console, FNP 02/20/24

## 2024-02-21 ENCOUNTER — Encounter: Payer: Medicare Other | Admitting: Family

## 2024-03-06 ENCOUNTER — Encounter: Admitting: Family

## 2024-03-12 ENCOUNTER — Telehealth: Payer: Self-pay | Admitting: Family

## 2024-03-12 NOTE — Telephone Encounter (Signed)
 Called to confirm/remind patient of their appointment at the Advanced Heart Failure Clinic on 03/13/24.   Appointment:   [x] Confirmed  [] Left mess   [] No answer/No voice mail  [] VM Full/unable to leave message  [] Phone not in service  Patient reminded to bring all medications and/or complete list.  Confirmed patient has transportation. Gave directions, instructed to utilize valet parking.

## 2024-03-12 NOTE — Progress Notes (Unsigned)
 Advanced Heart Failure Clinic Note   PCP: [facility] (last seen 09/24) Primary Cardiologist:Timothy Jerelene Monday, MD (last seen 08/24)  HPI:  [name] is a 73 y/o female with a history of T2DM, HTN, anemia, permanent atrial fibrillation, RA, aortic atherosclerosis, hyperlipidemia, syncope thought to be due to orthostasis, previous tobacco use and chronic heart failure.   Admitted to the hospital in 2021 with orthostatic syncope and A-fib with RVR, converting to sinus rhythm on diltiazem  drip.  Echo in 2021 showed an EF of 60 to 65%, no regional wall motion abnormalities, normal LV diastolic function parameters, normal RV systolic function and ventricular cavity size, mildly dilated left atrium, aortic valve sclerosis without evidence of stenosis, and an estimated right atrial pressure of 3 mmHg.    Zio patch in 07/2020 showed a predominant rhythm of sinus with an average rate of 76 bpm and intermittent A-fib with a burden of 1% with the longest episode lasting 2 hours and 38 minutes with an average rate of 121 bpm.   Echo 07/25/20: EF of 60-65% along with mild LAE.  Zio 05/24: Atrial Fibrillation occurred continuously (100% burden), ranging from 35-149 bpm (avg of 68 bpm). Isolated VEs were rare (<1.0%), VE Couplets were rare (<1.0%), and no VE Triplets were present. Ventricular Bigeminy was present. Conclusion: Continuous atrial fibrillation, average rate 68 and rare PVCs  Was in the ED 10/13/22 due to URI. Was in the ED 03/26/23 due to buttock cellulitis.   Echo 12/13/22: EF of 55-60% along with mild LVH, normal PA pressure and mild MR.   She presents today for a HF follow-up visit with a chief complaint of moderate SOB with minimal exertion. Chronic in nature although seems to be a little worse with the cold weather. Has associated fatigue and occasional palpitations along with this. Denies chest pain, cough, abdominal distention, pedal edema, dizziness or weight gain. Sleeping  well on 1 pillow  At last visit, spironolactone  25mg  daily was started but after reading about side effects, she didn't start it due to her concern about getting cancer.   ROS: All systems negative except as listed in HPI, PMH and Problem List.  SH:  Social History   Socioeconomic History   Marital status: Single    Spouse name: Not on file   Number of children: Not on file   Years of education: Not on file   Highest education level: Not on file  Occupational History   Not on file  Tobacco Use   Smoking status: Former   Smokeless tobacco: Never  Vaping Use   Vaping status: Former  Substance and Sexual Activity   Alcohol use: Not Currently   Drug use: Not on file   Sexual activity: Not on file  Other Topics Concern   Not on file  Social History Narrative   Not on file   Social Drivers of Health   Financial Resource Strain: Low Risk  (03/12/2023)   Received from [facility], [facility]   Overall Financial Resource Strain (CARDIA)    Difficulty of Paying Living Expenses: Not very hard  Food Insecurity: No Food Insecurity (03/12/2023)   Received from [facility], [facility]   Hunger Vital Sign    Worried About Running Out of Food in the Last Year: Never true    Ran Out of Food in the Last Year: Never true  Transportation Needs: No Transportation Needs (03/12/2023)   Received from [facility], [facility]  PRAPARE - Administrator, Civil Service (Medical): No    Lack of Transportation (Non-Medical): No  Physical Activity: Insufficiently Active (03/12/2023)   Received from [facility], [facility]   Exercise Vital Sign    Days of Exercise per Week: 3 days    Minutes of Exercise per Session: 20 min  Stress: Stress Concern Present (03/12/2023)   Received from [facility], [facility] of Occupational Health - Occupational Stress Questionnaire    Feeling of Stress : To some extent  Social Connections:  Moderately Integrated (03/12/2023)   Received from [facility], [facility]   Social Connection and Isolation Panel [NHANES]    Frequency of Communication with Friends and Family: More than three times a week    Frequency of Social Gatherings with Friends and Family: More than three times a week    Attends Religious Services: More than 4 times per year    Active Member of [employer] or Organizations: Yes    Attends Engineer, structural: More than 4 times per year    Marital Status: Never married  Intimate Partner Violence: Not At Risk (03/12/2023)   Received from [facility], [facility]   Humiliation, Afraid, Rape, and Kick questionnaire    Fear of Current or Ex-Partner: No    Emotionally Abused: No    Physically Abused: No    Sexually Abused: No    FH:  Family History  Problem Relation Age of Onset   Hypertension Mother    Hypertension Father     Past Medical History:  Diagnosis Date   Anemia    Arthritis    Chronic heart failure with preserved ejection fraction (HFpEF) (HCC)    a. 07/2020 Echo: EF 60-65%, no rwma, nl RV fxn, mildly dil LA, mild-mod AoV sclerosis.   Diabetes mellitus without complication (HCC)    Hypertension    PAF (paroxysmal atrial fibrillation) (HCC)    a. CHA2DS2VASc = 5-->eliquis ; b. 08/2020 Zio: Predominantly sinus rhythm 6 bpm.  1% burden of PAF (longest 2 hours and 38 minutes at 121).    Current Outpatient Medications  Medication Sig Dispense Refill   acetaminophen  (TYLENOL ) 325 MG tablet Take 650 mg by mouth every 6 (six) hours as needed for mild pain or moderate pain.     apixaban  (ELIQUIS ) 5 MG TABS tablet Take 1 tablet by mouth twice daily 60 tablet 5   ascorbic acid  (VITAMIN C) 500 MG tablet Take 500 mg by mouth daily.     atorvastatin  (LIPITOR) 40 MG tablet Take 1 tablet (40 mg total) by mouth every evening. 90 tablet 3   dapagliflozin  propanediol (FARXIGA ) 10 MG TABS tablet Take 1 tablet (10 mg total) by mouth daily  before breakfast. 30 tablet 11   folic acid (FOLVITE) 400 MCG tablet Take 400 mcg by mouth daily.     furosemide  (LASIX ) 20 MG tablet Take 1 tablet (20 mg total) by mouth daily. And additional 20mg  as needed 30 tablet 10   glipiZIDE  (GLUCOTROL  XL) 10 MG 24 hr tablet Take 10 mg by mouth daily.     ipratropium (ATROVENT ) 0.06 % nasal spray Place 2 sprays into both nostrils 4 (four) times daily. 15 mL 12   losartan  (COZAAR ) 100 MG tablet Take 100 mg by mouth daily.     magnesium  oxide (MAG-OX) 400 MG tablet Take 250 mg by mouth 2 (two) times daily.     metFORMIN  (  GLUCOPHAGE -XR) 500 MG 24 hr tablet Take 1,500 mg by mouth daily with supper.     metoprolol  tartrate (LOPRESSOR ) 50 MG tablet Take 50 mg by mouth 2 (two) times daily.     Multiple Vitamins-Minerals (MULTIVITAMIN WITH MINERALS) tablet Take 1 tablet by mouth daily.     Semaglutide,0.25 or 0.5MG /DOS, 2 MG/3ML SOPN Inject into the skin.     TOUJEO  SOLOSTAR 300 UNIT/ML Solostar Pen Inject 18 Units into the skin at bedtime.     TRULICITY 0.75 MG/0.5ML SOPN Inject 0.75 mg into the skin once a week. Mondays     verapamil  (CALAN -SR) 120 MG CR tablet Take 1 tablet (120 mg total) by mouth daily. 30 tablet 0   No current facility-administered medications for this visit.   There were no vitals filed for this visit.  Wt Readings from Last 3 Encounters:  09/20/23 210 lb (95.3 kg)  08/02/23 211 lb (95.7 kg)  06/06/23 208 lb 8 oz (94.6 kg)   Lab Results  Component Value Date   CREATININE 1.19 (H) 05/01/2023   CREATININE 0.94 09/04/2022   CREATININE 0.79 11/21/2021   PHYSICAL EXAM:  General:  Well appearing. No resp difficulty HEENT: normal Neck: supple. JVP flat. No lymphadenopathy or thryomegaly appreciated. Cor: PMI normal. Regular rate & irregular rhythm. No rubs, gallops or murmurs. Lungs: clear Abdomen: soft, nontender, nondistended. No hepatosplenomegaly. No bruits or masses.  Extremities: no cyanosis, clubbing, rash, edema Neuro:  alert & orientedx3, cranial nerves grossly intact. Moves all 4 extremities w/o difficulty. Affect pleasant.   ECG: not done   ASSESSMENT & PLAN:  1: NICM with preserved ejection fraction- - suspect due to HTN/ AF - NYHA class III - euvolemic today - weighing daily; reminded to call for an overnight weight gain of > 2 pounds or a weekly weight gain of > 5 pounds - weight down 1 pound from last visit here 6 weeks ago - Echo 07/25/20: EF of 60-65% along with mild LAE. - Echo 12/13/22: EF of 55-60% along with mild LVH, normal PA pressure and mild MR.  - not adding salt and has been diligent about reading food labels so that she can follow a low sodium diet  - continue farxiga  10mg  daily; refilled today - continue furosemide  20mg  daily with additional 20mg  PRN - continue losartan  100mg  daily - she didn't start spironolactone  because she was concerned about the potential cancer risks and doesn't want to do anything to potentially increase her risk - BNP 07/18/21 was 289.7  2: HTN- - BP 132/67 - saw PCP ([facility]) 09/24 - BMP 06/25/23 reviewed and showed sodium 140, potassium 3.9, creatinine 0.88 and GFR 69 - will not repeat labs today since she didn't start spironolactone  and she sees her PCP next week  3: Type 2 DM- - A1c 06/25/23 was 6.9% - glucose was 135 @ home this morning - continue glipizide  XL 10mg  daily - continue metformin  XR 1500mg  daily - continue toujeo  and trulicity  4: Paroxysmal Atrial fibrillation- - continue apixaban  5mg  BID - continue metoprolol  tartrate 50mg  BID - continue verapamil  120mg  daily - previously wore Zio monitor November 2021 & she was in NSR with intermittent AF - zio 05/24: Atrial Fibrillation occurred continuously (100% burden), ranging from 35-149 bpm (avg of 68 bpm). Isolated VEs were rare (<1.0%), VE Couplets were rare (<1.0%), and no VE Triplets were present. Ventricular Bigeminy was present.  Conclusion: 1. Continuous atrial fibrillation, average  rate 68. 2. Rare PVCs - low likelihood of restoring sinus  rhythm with the patient having been in A-fib since at least 03/2021. If rhythm strategy were pursued, would likely need antiarrhythmic therapy given duration of A-fib  - saw cardiology ([facility]) 08/24  5: Hyperlipidemia- - continue atorvastatin  40mg  daily - LDL 06/25/23 was 67; goal <70   Return in 5 months, sooner if needed.      Charlette Console, FNP 03/12/24

## 2024-03-13 ENCOUNTER — Ambulatory Visit: Admitting: Nurse Practitioner

## 2024-03-13 ENCOUNTER — Ambulatory Visit: Attending: Family | Admitting: Family

## 2024-03-13 ENCOUNTER — Encounter: Payer: Self-pay | Admitting: Family

## 2024-03-13 VITALS — BP 126/79 | HR 99 | Wt 205.6 lb

## 2024-03-13 DIAGNOSIS — I110 Hypertensive heart disease with heart failure: Secondary | ICD-10-CM | POA: Diagnosis not present

## 2024-03-13 DIAGNOSIS — Z79899 Other long term (current) drug therapy: Secondary | ICD-10-CM | POA: Diagnosis not present

## 2024-03-13 DIAGNOSIS — I480 Paroxysmal atrial fibrillation: Secondary | ICD-10-CM | POA: Diagnosis not present

## 2024-03-13 DIAGNOSIS — Z7985 Long-term (current) use of injectable non-insulin antidiabetic drugs: Secondary | ICD-10-CM | POA: Diagnosis not present

## 2024-03-13 DIAGNOSIS — Z87891 Personal history of nicotine dependence: Secondary | ICD-10-CM | POA: Insufficient documentation

## 2024-03-13 DIAGNOSIS — Z7901 Long term (current) use of anticoagulants: Secondary | ICD-10-CM | POA: Insufficient documentation

## 2024-03-13 DIAGNOSIS — I428 Other cardiomyopathies: Secondary | ICD-10-CM | POA: Insufficient documentation

## 2024-03-13 DIAGNOSIS — I5032 Chronic diastolic (congestive) heart failure: Secondary | ICD-10-CM | POA: Diagnosis present

## 2024-03-13 DIAGNOSIS — Z8249 Family history of ischemic heart disease and other diseases of the circulatory system: Secondary | ICD-10-CM | POA: Insufficient documentation

## 2024-03-13 DIAGNOSIS — I493 Ventricular premature depolarization: Secondary | ICD-10-CM | POA: Insufficient documentation

## 2024-03-13 DIAGNOSIS — E119 Type 2 diabetes mellitus without complications: Secondary | ICD-10-CM | POA: Diagnosis not present

## 2024-03-13 DIAGNOSIS — I10 Essential (primary) hypertension: Secondary | ICD-10-CM

## 2024-03-13 DIAGNOSIS — Z7984 Long term (current) use of oral hypoglycemic drugs: Secondary | ICD-10-CM | POA: Diagnosis not present

## 2024-03-13 DIAGNOSIS — R008 Other abnormalities of heart beat: Secondary | ICD-10-CM | POA: Diagnosis not present

## 2024-03-13 DIAGNOSIS — Z794 Long term (current) use of insulin: Secondary | ICD-10-CM | POA: Insufficient documentation

## 2024-03-13 DIAGNOSIS — E785 Hyperlipidemia, unspecified: Secondary | ICD-10-CM | POA: Insufficient documentation

## 2024-03-13 NOTE — Patient Instructions (Signed)
 Lab Work:  Go DOWN to LOWER LEVEL (LL) to have your blood work completed inside of [employer] office.  We will only call you if the results are abnormal or if the provider would like to make medication changes.   Testing/Procedures:  Please have your echo completed. You will check in for this at the [facility]ICAL [facility]ALL. You have to arrive 15 MINS EARLY for preparation, otherwise you will have to reschedule.    Follow-Up in: 6 MONTHS WITH Shawnee Dellen, FNP.  At the Advanced Heart Failure Clinic, you and your health needs are our priority. We have a designated team specialized in the treatment of Heart Failure. This Care Team includes your primary Heart Failure Specialized Cardiologist (physician), Advanced Practice Providers (APPs- Physician Assistants and Nurse Practitioners), and Pharmacist who all work together to provide you with the care you need, when you need it.   You may see any of the following providers on your designated Care Team at your next follow up:  Dr. Jules Oar Dr. Peder Bourdon Dr. Alwin Baars Dr. Judyth Nunnery Shawnee Dellen, FNP Bevely Brush, RPH-CPP  Please be sure to bring in all your medications bottles to every appointment.   Need to Contact Us :  If you have any questions or concerns before your next appointment please send us  a message through [place] or call our office at [phone].    TO LEAVE A MESSAGE FOR THE NURSE SELECT OPTION 2, PLEASE LEAVE A MESSAGE INCLUDING: YOUR NAME DATE OF BIRTH CALL BACK NUMBER REASON FOR CALL**this is important as we prioritize the call backs  YOU WILL RECEIVE A CALL BACK THE SAME DAY AS LONG AS YOU CALL BEFORE 4:00 PM

## 2024-04-19 ENCOUNTER — Other Ambulatory Visit: Payer: Self-pay | Admitting: Family

## 2024-04-19 DIAGNOSIS — I480 Paroxysmal atrial fibrillation: Secondary | ICD-10-CM

## 2024-06-08 ENCOUNTER — Other Ambulatory Visit: Payer: Self-pay | Admitting: Family

## 2024-06-08 NOTE — Telephone Encounter (Signed)
 Unable to complete refill request, medication is not included in the refill protocol.  Please review below request.   Patient is requesting the following refill Requested Prescriptions   Pending Prescriptions Disp Refills  . TOUJEO  SOLOSTAR U-300 INSULIN  300 unit/mL (1.5 mL) injection pen [Pharmacy Med Name: Toujeo  SoloStar 300 UNIT/ML Subcutaneous Solution Pen-injector] 6 mL 0    Sig: INJECT 18 UNITS SUBCUTANEOUSLY NIGHTLY    Recent Visits Date Type Provider Dept  03/31/24 Office Visit Dwan Ozell Lenis, DO [facility]  12/30/23 Office Visit Dwan Ozell Lenis, DO [facility]  12/19/23 Office Visit Tollie Clarity, [place] [facility]  09/27/23 Office Visit Dwan Ozell Lenis, DO [facility]  06/25/23 Office Visit Dwan Ozell Lenis, DO [facility] [place]  Showing recent visits within past 365 days and meeting all other requirements Future Appointments Date Type Provider Dept  06/16/24 Appointment Dwan Ozell Lenis, DO [facility]  Showing future appointments within next 365 days and meeting all other requirements    Labs: Not applicable this refill

## 2024-06-10 NOTE — Progress Notes (Unsigned)
 Cardiology Office Note    Date:  06/11/2024   ID:  Karen Murphy, DOB Oct 28, 1950, MRN [id number]  PCP:  Dwan Ozell BIRCH, DO  Cardiologist:  Evalene Lunger, MD  Electrophysiologist:  None   Chief Complaint: ED follow up  History of Present Illness:   Karen Murphy is a 73 y.o. female with history of HFpEF, permanent atrial fibrillation on apixaban , aortic atherosclerosis, hypertension, hyperlipidemia, syncope felt to be in the setting of orthostasis and A-fib with RVR, type 2 diabetes, and rheumatoid arthritis who presents for ED follow up.    Patient was admitted in 2021 with orthostatic syncope and atrial fibrillation with RVR, converting to sinus rhythm on diltiazem  drip.  Echo showed EF 60 to 65%, no RWMA, mildly dilated left atrium.  ZIO monitor did not/2021 showed predominant rhythm of sinus with intermittent atrial fibrillation at a burden of 1% with the longest episode lasting 2 hours and 38 minutes with an average rate of 121 bpm.  EKGs dating back to early 2022 had shown atrial fibrillation with controlled ventricular response.  Echo 11/2022 showed EF 55 to 60%, mild LVH, mild MR.  She underwent repeat ZIO monitoring 01/2023 in the setting of palpitations which showed atrial fibrillation burden of 100% with average ventricular rate of 68 bpm (range 35 to 149 bpm), and rare PVCs.   Patient was most recently seen in the cardiology clinic 05/2023 and overall doing well from a cardiac perspective.  She was deemed to have permanent atrial fibrillation given that EKGs dating back to at least 03/2021 showed atrial fibrillation.  Recommendation to continue rate control given low likelihood of restoring sinus rhythm.  She follows with the [facility] and was most recently seen 02/2024 overall doing well from a cardiac perspective.  She was hesitant to start spironolactone  due to increased cancer risks.  Repeat echocardiogram was ordered and scheduled for  08/2024.  Patient was recently seen at [facility] ED 06/03/2024 in the setting of worsening dyspnea on exertion and fatigue with nonproductive cough.  proBNP was elevated at 768.  Troponin negative x 2.  Bedside POCUS done by ED physician reportedly with grossly reduced LV systolic function.  ED physician recommended admission.  However, hospitalist service felt that exacerbation was mild (no O2 requirement or need for IV diuresis) and patient met criteria for outpatient workup. Patient agreed with this plan.  Patient presents today with ongoing concerns. She reports that over the past couple of months she has noticed worsening dyspnea on minimal exertion and fatigue. She reports that she has not been able to complete her normal activities due to this. She typically walks the aisles of the grocery store for exercise, but lately she has been too tired to do so. She also reports that she typically does body weight exercises at home but has not been able to do as many repetitions due to feeling out of breath and tired. She has also been napping during the day which she has never done in the past. She has also been more aware of her heart beat, but she denies feeling of heart racing or palpitations. She feels as though she is euvolemic and has only need an extra dose of as needed Lasix  a couple times in the past few weeks. She typically takes an extra dose of Lasix  when she knows she's had increased fluid intake. She denies chest pain, lightheadedness, dizziness, lower extremity swelling, orthopnea, and PND. She denies any recent illness. No recent  medication changes or dietary changes.   Labs independently reviewed: 06/03/2024 Hgb 12.5, HCT 35.9, platelet 281, potassium 3.8, sodium 140, BUN 19, creatinine 0.89, normal LFTs, proBNP 768, troponin negative x 2 06/2023- TG 138, TC 130, HDL 35, LDL 67  Objective   Past Medical History:  Diagnosis Date   Anemia    Arthritis    Chronic heart failure  with preserved ejection fraction (HFpEF) (HCC)    a. 07/2020 Echo: EF 60-65%, no rwma, nl RV fxn, mildly dil LA, mild-mod AoV sclerosis.   Diabetes mellitus without complication (HCC)    Hypertension    PAF (paroxysmal atrial fibrillation) (HCC)    a. CHA2DS2VASc = 5-->eliquis ; b. 08/2020 Zio: Predominantly sinus rhythm 6 bpm.  1% burden of PAF (longest 2 hours and 38 minutes at 121).    Current Medications: Current Meds  Medication Sig   acetaminophen  (TYLENOL ) 325 MG tablet Take 650 mg by mouth every 6 (six) hours as needed for mild pain or moderate pain.   apixaban  (ELIQUIS ) 5 MG TABS tablet Take 1 tablet by mouth twice daily   ascorbic acid  (VITAMIN C) 500 MG tablet Take 500 mg by mouth daily.   atorvastatin  (LIPITOR) 40 MG tablet Take 1 tablet (40 mg total) by mouth every evening.   dapagliflozin  propanediol (FARXIGA ) 10 MG TABS tablet Take 1 tablet (10 mg total) by mouth daily before breakfast.   folic acid (FOLVITE) 400 MCG tablet Take 400 mcg by mouth daily.   furosemide  (LASIX ) 20 MG tablet TAKE 1 TABLET BY MOUTH ONCE DAILY AND  ADDITIONAL  20MG   AS  NEEDED   glipiZIDE  (GLUCOTROL  XL) 10 MG 24 hr tablet Take 10 mg by mouth daily.   ipratropium (ATROVENT ) 0.06 % nasal spray Place 2 sprays into both nostrils 4 (four) times daily.   losartan  (COZAAR ) 100 MG tablet Take 100 mg by mouth daily.   magnesium  oxide (MAG-OX) 400 MG tablet Take 250 mg by mouth 2 (two) times daily.   metFORMIN  (GLUCOPHAGE -XR) 500 MG 24 hr tablet Take 1,500 mg by mouth daily with supper.   metoprolol  tartrate (LOPRESSOR ) 50 MG tablet Take 50 mg by mouth 2 (two) times daily.   Multiple Vitamins-Minerals (MULTIVITAMIN WITH MINERALS) tablet Take 1 tablet by mouth daily.   Semaglutide,0.25 or 0.5MG /DOS, 2 MG/3ML SOPN Inject into the skin.   TOUJEO  SOLOSTAR 300 UNIT/ML Solostar Pen Inject 18 Units into the skin at bedtime.   verapamil  (CALAN -SR) 120 MG CR tablet Take 1 tablet (120 mg total) by mouth daily.     Allergies:   Diltiazem    Social History   Socioeconomic History   Marital status: Single    Spouse name: Not on file   Number of children: Not on file   Years of education: Not on file   Highest education level: Not on file  Occupational History   Not on file  Tobacco Use   Smoking status: Former   Smokeless tobacco: Never  Vaping Use   Vaping status: Former  Substance and Sexual Activity   Alcohol use: Not Currently   Drug use: Not on file   Sexual activity: Not on file  Other Topics Concern   Not on file  Social History Narrative   Not on file   Social Drivers of Health   Financial Resource Strain: Low Risk  (03/12/2023)   Received from [facility]   Overall Financial Resource Strain (CARDIA)    Difficulty of Paying Living Expenses: Not very hard  Food  Insecurity: No Food Insecurity (03/12/2023)   Received from [facility]   Hunger Vital Sign    Within the past 12 months, you worried that your food would run out before you got the money to buy more.: Never true    Within the past 12 months, the food you bought just didn't last and you didn't have money to get more.: Never true  Transportation Needs: No Transportation Needs (03/12/2023)   Received from [facility] - Transportation    Lack of Transportation (Medical): No    Lack of Transportation (Non-Medical): No  Physical Activity: Insufficiently Active (03/12/2023)   Received from [facility]   Exercise Vital Sign    On average, how many days per week do you engage in moderate to strenuous exercise (like a brisk walk)?: 3 days    On average, how many minutes do you engage in exercise at this level?: 20 min  Stress: Stress Concern Present (03/12/2023)   Received from [facility] - Occupational Stress Questionnaire    Feeling of Stress : To some extent  Social Connections: Moderately Integrated (03/12/2023)   Received from [facility]    Social Connection and Isolation Panel    In a typical week, how many times do you talk on the phone with family, friends, or neighbors?: More than three times a week    How often do you get together with friends or relatives?: More than three times a week    How often do you attend church or religious services?: More than 4 times per year    Do you belong to any clubs or organizations such as church groups, unions, fraternal or athletic groups, or school groups?: Yes    How often do you attend meetings of the clubs or organizations you belong to?: More than 4 times per year    Are you married, widowed, divorced, separated, never married, or living with a partner?: Never married     Family History:  The patient's family history includes Hypertension in her father and mother.  ROS:   12-point review of systems is negative unless otherwise noted in the HPI.  EKGs/Other Studies Reviewed:    Studies reviewed were summarized above. The additional studies were reviewed today:  02/2023 Long term monitor 1. Continuous atrial fibrillation, average rate 68.  2. Rare PVCs  11/2022 Echo complete  1. Left ventricular ejection fraction, by estimation, is 55 to 60%. The  left ventricle has normal function. The left ventricle has no regional  wall motion abnormalities. There is mild left ventricular hypertrophy.  Left ventricular diastolic parameters  are indeterminate.   2. Right ventricular systolic function is normal. The right ventricular  size is normal. There is normal pulmonary artery systolic pressure. The  estimated right ventricular systolic pressure is 24.5 mmHg.   3. The mitral valve is normal in structure. Mild mitral valve  regurgitation. No evidence of mitral stenosis.   4. The aortic valve is tricuspid. There is mild calcification of the  aortic valve. Aortic valve regurgitation is not visualized. Aortic valve  sclerosis/calcification is present, without any evidence of aortic   stenosis. Aortic valve mean gradient  measures 4.0 mmHg.   5. The inferior vena cava is normal in size with greater than 50%  respiratory variability, suggesting right atrial pressure of 3 mmHg.   EKG:  EKG personally reviewed by me today EKG Interpretation Date/Time:  Thursday  June 11 2024 13:38:02 EDT Ventricular Rate:  78 PR Interval:    QRS Duration:  78 QT Interval:  384 QTC Calculation: 437 R Axis:   59  Text Interpretation: Atrial fibrillation Low voltage QRS When compared with ECG of 13-Mar-2024 10:06, No significant change was found Confirmed by Lorene Sinclair ([id number]) on 06/11/2024 1:40:40 PM  PHYSICAL EXAM:    VS:  BP (!) 100/58 (BP Location: Left Arm, Patient Position: Sitting, Cuff Size: Normal)   Pulse 78   Ht 5' 6 (1.676 m)   Wt 205 lb 2 oz (93 kg)   SpO2 97%   BMI 33.11 kg/m   BMI: Body mass index is 33.11 kg/m.  GEN: Well nourished, well developed in no acute distress NECK: No JVD; No carotid bruits CARDIAC: IRIR, no murmurs, rubs, gallops RESPIRATORY:  Clear to auscultation without rales, wheezing or rhonchi  ABDOMEN: Soft, non-tender, non-distended EXTREMITIES: No edema; No deformity  Wt Readings from Last 3 Encounters:  06/11/24 205 lb 2 oz (93 kg)  03/13/24 205 lb 9.6 oz (93.3 kg)  09/20/23 210 lb (95.3 kg)       ASSESSMENT & PLAN:   HFpEF Dyspnea Fatigue - Most recent echo 11/2022 with EF 55-60%. Patient reports 2 months of worsening dyspnea on exertion with minimal activity and fatigue. She was seen in the ED 8/20 for these symptoms with mildly elevated pro-BNP. Bedside POCUS done by ED physician reportedly showed grossly reduced LV systolic function. She was not admitted and a formal echocardiogram was not performed. Today she reports ongoing symptoms. She appears euvolemic on exam with weight reflecting this. Continue Farxiga  10 mg daily, Lasix  20 mg daily with an addition 20 mg daily as needed for swelling, losartan  100 mg daily, and metoprolol   tartrate 50 mg twice daily. Recommend updating echo. Check BMP, CBC, and TSH.   Permanent atrial fibrillation - Remains in rate controlled atrial fibrillation on metoprolol  and verapamil . Continue Eliquis  for CHA2DS2VASc of at least 6.  Hypertension - BP well controlled. Continue current dose of losartan , metoprolol , and verapamil .   Aortic atherosclerosis Hyperlipidemia - Most recent LDL 67. Continue statin therapy.    Disposition: Check BMP, CBC, and TSH. Update echo. F/u with Dr. Gollan or an APP in 2-4 weeks/after testing.   Medication Adjustments/Labs and Tests Ordered: Current medicines are reviewed at length with the patient today.  Concerns regarding medicines are outlined above. Medication changes, Labs and Tests ordered today are summarized above and listed in the Patient Instructions accessible in Encounters.   Bonney Sinclair Lorene, PA-C 06/11/2024 2:14 PM     [facility] [address] [place], [place] [address] [phone]

## 2024-06-11 ENCOUNTER — Encounter: Payer: Self-pay | Admitting: Nurse Practitioner

## 2024-06-11 ENCOUNTER — Ambulatory Visit: Attending: Nurse Practitioner | Admitting: Physician Assistant

## 2024-06-11 VITALS — BP 100/58 | HR 78 | Ht 66.0 in | Wt 205.1 lb

## 2024-06-11 DIAGNOSIS — Z79899 Other long term (current) drug therapy: Secondary | ICD-10-CM | POA: Diagnosis present

## 2024-06-11 DIAGNOSIS — I10 Essential (primary) hypertension: Secondary | ICD-10-CM | POA: Insufficient documentation

## 2024-06-11 DIAGNOSIS — I700 Atherosclerosis of aorta: Secondary | ICD-10-CM | POA: Diagnosis present

## 2024-06-11 DIAGNOSIS — R0602 Shortness of breath: Secondary | ICD-10-CM | POA: Insufficient documentation

## 2024-06-11 DIAGNOSIS — R5383 Other fatigue: Secondary | ICD-10-CM | POA: Insufficient documentation

## 2024-06-11 DIAGNOSIS — I4821 Permanent atrial fibrillation: Secondary | ICD-10-CM | POA: Diagnosis not present

## 2024-06-11 DIAGNOSIS — I5032 Chronic diastolic (congestive) heart failure: Secondary | ICD-10-CM | POA: Insufficient documentation

## 2024-06-11 DIAGNOSIS — E785 Hyperlipidemia, unspecified: Secondary | ICD-10-CM | POA: Diagnosis present

## 2024-06-11 NOTE — Patient Instructions (Signed)
 Medication Instructions:  Your physician recommends that you continue on your current medications as directed. Please refer to the Current Medication list given to you today.    *If you need a refill on your cardiac medications before your next appointment, please call your pharmacy*  Lab Work: Your provider would like for you to have following labs drawn today CBC, BMP, TSH.     Testing/Procedures: Your physician has requested that you have an echocardiogram. Echocardiography is a painless test that uses sound waves to create images of your heart. It provides your doctor with information about the size and shape of your heart and how well your heart's chambers and valves are working.   You may receive an ultrasound enhancing agent through an IV if needed to better visualize your heart during the echo. This procedure takes approximately one hour.  There are no restrictions for this procedure.  This will take place at [address] [facility] [address] [facility]) [address], [place] [address]  Please note: We ask at that you not bring children with you during ultrasound (echo/ vascular) testing. Due to room size and safety concerns, children are not allowed in the ultrasound rooms during exams. Our front office staff cannot provide observation of children in our lobby area while testing is being conducted. An adult accompanying a patient to their appointment will only be allowed in the ultrasound room at the discretion of the ultrasound technician under special circumstances. We apologize for any inconvenience.   Follow-Up: At [facility], you and your health needs are our priority.  As part of our continuing mission to provide you with exceptional heart care, our providers are all part of one team.  This team includes your primary Cardiologist (physician) and Advanced Practice Providers or APPs (Physician Assistants and Nurse Practitioners) who all work together to provide you with the care you  need, when you need it.  Your next appointment:   1 month(s)  Provider:   Timothy Gollan, MD or Lonni Meager, NP

## 2024-07-03 ENCOUNTER — Ambulatory Visit

## 2024-07-22 ENCOUNTER — Ambulatory Visit: Admitting: Nurse Practitioner

## 2024-07-29 ENCOUNTER — Other Ambulatory Visit

## 2024-08-13 ENCOUNTER — Ambulatory Visit: Admitting: Nurse Practitioner

## 2024-08-21 ENCOUNTER — Other Ambulatory Visit (HOSPITAL_COMMUNITY): Payer: Self-pay

## 2024-08-24 ENCOUNTER — Ambulatory Visit

## 2024-08-27 ENCOUNTER — Telehealth: Payer: Self-pay | Admitting: Family

## 2024-08-27 NOTE — Telephone Encounter (Signed)
 Called to confirm/remind patient of their appointment at the [facility] on 08/28/24.   Appointment:   [] Confirmed  [] Left mess   [] No answer/No voice mail  [] VM Full/unable to leave message  [x] Phone not in service  Patient reminded to bring all medications and/or complete list.  Confirmed patient has transportation. Gave directions, instructed to utilize valet parking.

## 2024-08-27 NOTE — Progress Notes (Deleted)
 Advanced Heart Failure Clinic Note    PCP: [facility]  Primary Cardiologist:Timothy Perla, MD   Chief Complaint:    HPI:  Karen Murphy is a 73 y/o female with a history of T2DM, HTN, anemia, permanent atrial fibrillation, RA, aortic atherosclerosis, hyperlipidemia, syncope thought to be due to orthostasis, previous tobacco use and chronic heart failure.   Admitted 07/2020 with orthostatic syncope and A-fib with RVR, converting to sinus rhythm on diltiazem  drip.  Echo in 2021 showed an EF of 60 to 65%, no regional wall motion abnormalities, normal LV diastolic function parameters, normal RV systolic function and ventricular cavity size, mildly dilated left atrium, aortic valve sclerosis without evidence of stenosis, and an estimated right atrial pressure of 3 mmHg.    Zio patch in 07/2020 showed a predominant rhythm of sinus with an average rate of 76 bpm and intermittent A-fib with a burden of 1% with the longest episode lasting 2 hours and 38 minutes with an average rate of 121 bpm.   Echo 07/25/20: EF of 60-65% along with mild LAE.  Echo 12/13/22: EF of 55-60% along with mild LVH, normal PA pressure and mild MR.   Zio 05/24: Atrial Fibrillation occurred continuously (100% burden), ranging from 35-149 bpm (avg of 68 bpm). Isolated VEs were rare (<1.0%), VE Couplets were rare (<1.0%), and no VE Triplets were present. Ventricular Bigeminy was present. Conclusion: Continuous atrial fibrillation, average rate 68 and rare PVCs  Was in the ED 12/31/23 with left ear hearing loss with cerumen impaction.   Was in the ED 06/03/24 for worsening SOB over preceding 3 months. BNP elevated at 768. ED bedside POCUS noted reduced ejection fraction. Discharged for outpatient follow-up and [facility] cardiology referral placed.   Saw [facility] cardiology 08/25 with continued shortness of breath occurring with less activity. Also feeling more fatigued and napping more than usual. Labs drawn and echo  ordered.   Echo 06/23/24: EF >55%, normal RV, normal IVC.   Saw [facility] cardiology 10/25 with continued fatigue. TSH drawn. Discussing possible sleep study.   She presents today for a HF follow-up visit with a chief complaint of   ROS: All systems negative except as listed in HPI, PMH and Problem List.  SH:  Social History   Socioeconomic History   Marital status: Single    Spouse name: Not on file   Number of children: Not on file   Years of education: Not on file   Highest education level: Not on file  Occupational History   Not on file  Tobacco Use   Smoking status: Former   Smokeless tobacco: Never  Vaping Use   Vaping status: Former  Substance and Sexual Activity   Alcohol use: Not Currently   Drug use: Not on file   Sexual activity: Not on file  Other Topics Concern   Not on file  Social History Narrative   Not on file   Social Drivers of Health   Financial Resource Strain: Low Risk (03/12/2023)   Received from [facility]   Overall Financial Resource Strain (CARDIA)    Difficulty of Paying Living Expenses: Not very hard  Food Insecurity: No Food Insecurity (03/12/2023)   Received from [facility]   Hunger Vital Sign    Within the past 12 months, you worried that your food would run out before you got the money to buy more.: Never true    Within the past 12 months, the food you bought just didn't last and  you didn't have money to get more.: Never true  Transportation Needs: No Transportation Needs (03/12/2023)   Received from [facility] - Transportation    Lack of Transportation (Medical): No    Lack of Transportation (Non-Medical): No  Physical Activity: Insufficiently Active (03/12/2023)   Received from [facility]   Exercise Vital Sign    On average, how many days per week do you engage in moderate to strenuous exercise (like a brisk walk)?: 3 days    On average, how many minutes do you engage in exercise at this level?: 20 min   Stress: Stress Concern Present (03/12/2023)   Received from [facility] - Occupational Stress Questionnaire    Feeling of Stress : To some extent  Social Connections: Moderately Integrated (03/12/2023)   Received from [facility]   Social Connection and Isolation Panel    In a typical week, how many times do you talk on the phone with family, friends, or neighbors?: More than three times a week    How often do you get together with friends or relatives?: More than three times a week    How often do you attend church or religious services?: More than 4 times per year    Do you belong to any clubs or organizations such as church groups, unions, fraternal or athletic groups, or school groups?: Yes    How often do you attend meetings of the clubs or organizations you belong to?: More than 4 times per year    Are you married, widowed, divorced, separated, never married, or living with a partner?: Never married  Intimate Partner Violence: Not At Risk (03/12/2023)   Received from [facility]   Humiliation, Afraid, Rape, and Kick questionnaire    Within the last year, have you been afraid of your partner or ex-partner?: No    Within the last year, have you been humiliated or emotionally abused in other ways by your partner or ex-partner?: No    Within the last year, have you been kicked, hit, slapped, or otherwise physically hurt by your partner or ex-partner?: No    Within the last year, have you been raped or forced to have any kind of sexual activity by your partner or ex-partner?: No    FH:  Family History  Problem Relation Age of Onset   Hypertension Mother    Hypertension Father     Past Medical History:  Diagnosis Date   Anemia    Arthritis    Chronic heart failure with preserved ejection fraction (HFpEF) (HCC)    a. 07/2020 Echo: EF 60-65%, no rwma, nl RV fxn, mildly dil LA, mild-mod AoV sclerosis.   Diabetes mellitus without  complication (HCC)    Hypertension    PAF (paroxysmal atrial fibrillation) (HCC)    a. CHA2DS2VASc = 5-->eliquis ; b. 08/2020 Zio: Predominantly sinus rhythm 6 bpm.  1% burden of PAF (longest 2 hours and 38 minutes at 121).    Current Outpatient Medications  Medication Sig Dispense Refill   acetaminophen  (TYLENOL ) 325 MG tablet Take 650 mg by mouth every 6 (six) hours as needed for mild pain or moderate pain.     apixaban  (ELIQUIS ) 5 MG TABS tablet Take 1 tablet by mouth twice daily 60 tablet 5   ascorbic acid  (VITAMIN C) 500 MG tablet Take 500 mg by mouth daily.     atorvastatin  (LIPITOR) 40 MG tablet Take 1 tablet (  40 mg total) by mouth every evening. 90 tablet 3   dapagliflozin  propanediol (FARXIGA ) 10 MG TABS tablet Take 1 tablet (10 mg total) by mouth daily before breakfast. 30 tablet 11   folic acid (FOLVITE) 400 MCG tablet Take 400 mcg by mouth daily.     furosemide  (LASIX ) 20 MG tablet TAKE 1 TABLET BY MOUTH ONCE DAILY AND  ADDITIONAL  20MG   AS  NEEDED 180 tablet 0   glipiZIDE  (GLUCOTROL  XL) 10 MG 24 hr tablet Take 10 mg by mouth daily.     ipratropium (ATROVENT ) 0.06 % nasal spray Place 2 sprays into both nostrils 4 (four) times daily. 15 mL 12   losartan  (COZAAR ) 100 MG tablet Take 100 mg by mouth daily.     magnesium  oxide (MAG-OX) 400 MG tablet Take 250 mg by mouth 2 (two) times daily.     metFORMIN  (GLUCOPHAGE -XR) 500 MG 24 hr tablet Take 1,500 mg by mouth daily with supper.     metoprolol  tartrate (LOPRESSOR ) 50 MG tablet Take 50 mg by mouth 2 (two) times daily.     Multiple Vitamins-Minerals (MULTIVITAMIN WITH MINERALS) tablet Take 1 tablet by mouth daily.     Semaglutide,0.25 or 0.5MG /DOS, 2 MG/3ML SOPN Inject into the skin.     TOUJEO  SOLOSTAR 300 UNIT/ML Solostar Pen Inject 18 Units into the skin at bedtime.     verapamil  (CALAN -SR) 120 MG CR tablet Take 1 tablet (120 mg total) by mouth daily. 30 tablet 0   No current facility-administered medications for this visit.    There were no vitals filed for this visit.  Wt Readings from Last 3 Encounters:  06/11/24 205 lb 2 oz (93 kg)  03/13/24 205 lb 9.6 oz (93.3 kg)  09/20/23 210 lb (95.3 kg)   Lab Results  Component Value Date   CREATININE 1.19 (H) 05/01/2023   CREATININE 0.94 09/04/2022   CREATININE 0.79 11/21/2021    PHYSICAL EXAM:  General: Well appearing. No resp difficulty HEENT: normal Neck: supple, no JVD Cor: Irregular rhythm, normal rate. No rubs, gallops or murmurs Lungs: clear Abdomen: soft, nontender, nondistended. Extremities: no cyanosis, clubbing, rash, edema Neuro: alert & oriented X 3. Moves all 4 extremities w/o difficulty. Affect pleasant   ECG: atrial fibrillation, HR 70 (personally reviewed)   ASSESSMENT & PLAN:  1: NICM with preserved ejection fraction- - suspect due to HTN/ AF - NYHA Murphy II - euvolemic today - weight 205.6 pounds from last visit here 6 months ago - Echo 07/25/20: EF of 60-65% along with mild LAE. - Echo 12/13/22: EF of 55-60% along with mild LVH, normal PA pressure and mild MR.  - Echo 06/23/24: EF >55%, normal RV, normal IVC.  - not adding salt and has been diligent about reading food labels so that she can follow a low sodium diet  - continue farxiga  10mg  daily - continue furosemide  20mg  daily with additional 20mg  PRN - continue losartan  100mg  daily - pro-BNP 06/03/24 was 768.0  2: HTN- - BP  - saw PCP [facility]) 03/25 - BMP 06/03/24 reviewed: sodium 140, potassium 3.8, creatinine 0.89 and GFR 68 - BMET today  3: Type 2 DM- - A1c 07/21/24 was 7.2% - continue glipizide  XL 10mg  daily - continue metformin  XR 1500mg  daily - continue toujeo  and ozemipc  4: Paroxysmal Atrial fibrillation- - continue apixaban  5mg  BID - continue metoprolol  tartrate 50mg  BID - continue verapamil  120mg  daily - previously wore Zio monitor November 2021 & she was in NSR with intermittent AF -  zio 05/24: Atrial Fibrillation occurred continuously (100% burden), ranging  from 35-149 bpm (avg of 68 bpm). Isolated VEs were rare (<1.0%), VE Couplets were rare (<1.0%), and no VE Triplets were present. Ventricular Bigeminy was present.  Conclusion: 1. Continuous atrial fibrillation, average rate 68. 2. Rare PVCs - low likelihood of restoring sinus rhythm with the patient having been in A-fib since at least 03/2021. If rhythm strategy were pursued, would likely need antiarrhythmic therapy given duration of A-fib  - saw [facility] cardiology 10/25  5: Hyperlipidemia- - continue atorvastatin  40mg  daily - LDL 07/28/24 was 73; goal <29     Karen Murphy, [place] 08/27/24

## 2024-08-28 ENCOUNTER — Encounter: Admitting: Family

## 2024-08-28 ENCOUNTER — Telehealth: Payer: Self-pay | Admitting: Family

## 2024-08-28 NOTE — Telephone Encounter (Signed)
 Patient did not show for her Heart Failure Clinic appointment on 08/28/24.

## 2024-09-02 ENCOUNTER — Other Ambulatory Visit: Payer: Self-pay | Admitting: Family

## 2024-10-05 ENCOUNTER — Other Ambulatory Visit: Payer: Self-pay

## 2024-10-05 ENCOUNTER — Emergency Department
Admission: EM | Admit: 2024-10-05 | Discharge: 2024-10-05 | Disposition: A | Discharge status: Home / Self Care | Attending: Emergency Medicine | Admitting: Emergency Medicine

## 2024-10-05 ENCOUNTER — Emergency Department

## 2024-10-05 DIAGNOSIS — I480 Paroxysmal atrial fibrillation: Secondary | ICD-10-CM | POA: Diagnosis not present

## 2024-10-05 DIAGNOSIS — I10 Essential (primary) hypertension: Secondary | ICD-10-CM | POA: Insufficient documentation

## 2024-10-05 DIAGNOSIS — E162 Hypoglycemia, unspecified: Secondary | ICD-10-CM

## 2024-10-05 DIAGNOSIS — R002 Palpitations: Secondary | ICD-10-CM

## 2024-10-05 DIAGNOSIS — R0789 Other chest pain: Secondary | ICD-10-CM | POA: Insufficient documentation

## 2024-10-05 DIAGNOSIS — E11649 Type 2 diabetes mellitus with hypoglycemia without coma: Secondary | ICD-10-CM | POA: Diagnosis not present

## 2024-10-05 DIAGNOSIS — R079 Chest pain, unspecified: Secondary | ICD-10-CM

## 2024-10-05 DIAGNOSIS — Z7901 Long term (current) use of anticoagulants: Secondary | ICD-10-CM | POA: Diagnosis not present

## 2024-10-05 DIAGNOSIS — R42 Dizziness and giddiness: Secondary | ICD-10-CM

## 2024-10-05 LAB — BASIC METABOLIC PANEL WITH GFR
Anion gap: 14 (ref 5–15)
BUN: 20 mg/dL (ref 8–23)
CO2: 22 mmol/L (ref 22–32)
Calcium: 9.2 mg/dL (ref 8.9–10.3)
Chloride: 104 mmol/L (ref 98–111)
Creatinine, Ser: 0.85 mg/dL (ref 0.44–1.00)
GFR, Estimated: >60 mL/min
Glucose, Bld: 58 mg/dL — ABNORMAL LOW (ref 70–99)
Potassium: 4 mmol/L (ref 3.5–5.1)
Sodium: 140 mmol/L (ref 135–145)

## 2024-10-05 LAB — CBC
HCT: 35.5 % — ABNORMAL LOW (ref 36.0–46.0)
Hemoglobin: 11.8 g/dL — ABNORMAL LOW (ref 12.0–15.0)
MCH: 30.8 pg (ref 26.0–34.0)
MCHC: 33.2 g/dL (ref 30.0–36.0)
MCV: 92.7 fL (ref 80.0–100.0)
Platelets: 266 K/uL (ref 150–400)
RBC: 3.83 MIL/uL — ABNORMAL LOW (ref 3.87–5.11)
RDW: 12.6 % (ref 11.5–15.5)
WBC: 5.2 K/uL (ref 4.0–10.5)
nRBC: 0 % (ref 0.0–0.2)

## 2024-10-05 LAB — CBG MONITORING, ED: Glucose-Capillary: 129 mg/dL — ABNORMAL HIGH (ref 70–99)

## 2024-10-05 LAB — TROPONIN T, HIGH SENSITIVITY: Troponin T High Sensitivity: <15 ng/L (ref 0–19)

## 2024-10-05 NOTE — ED Notes (Signed)
 Called lab about BMP at this time

## 2024-10-05 NOTE — ED Triage Notes (Addendum)
 First nurse note: Pt to ED via [facility] from home. Pt reports left sided CP x2 hrs started while driving. Some nausea. Hx of afib. Pain initially 8/10 and 2/10 on arrival. Pt is on blood thinner.   HR 70 in afib 160/74 96% RA  20g RH  324 mg ASA  4mg  zofran 

## 2024-10-05 NOTE — Discharge Instructions (Signed)
 Please take your medications as prescribed.  Please make sure to see your cardiologist this week or early next week to get reassessed.

## 2024-10-05 NOTE — ED Provider Notes (Signed)
 "  [facility] Provider Note    Event Date/Time   First MD Initiated Contact with Patient 10/05/24 1555     (approximate)   History   Chest Pain   HPI  Karen Murphy is a 73 y.o. female with history of paroxysmal A-fib on Eliquis , diabetes, hypertension, presenting with chest pain.  States that she was out shopping, felt lightheaded, thought it was low blood glucose gave results only to eat.  Started to have some palpitations and chest pressure.  No symptoms at all resolved at this time.  Denies shortness of breath or leg swelling.  States that she is been compliant with her medications.  Denies any infectious symptoms.  No urinary symptoms, no nausea vomiting or diarrhea.  Independent history from EMS, she was given 324 of aspirin , 4 mg of Zofran , heart rate was 70 and A-fib.  Was complaining about left chest pain for 2 hours.  On independent chart review, she had a prior echo in 24 that shows EF of 55 to 60%.     Physical Exam   Triage Vital Signs: ED Triage Vitals  Encounter Vitals Group     BP 10/05/24 1407 100/69     Girls Systolic BP Percentile --      Girls Diastolic BP Percentile --      Boys Systolic BP Percentile --      Boys Diastolic BP Percentile --      Pulse Rate 10/05/24 1407 66     Resp 10/05/24 1407 16     Temp 10/05/24 1407 98.1 F (36.7 C)     Temp Source 10/05/24 1407 Oral     SpO2 10/05/24 1407 96 %     Weight --      Height --      Head Circumference --      Peak Flow --      Pain Score 10/05/24 1415 5     Pain Loc --      Pain Education --      Exclude from Growth Chart --     Most recent vital signs: Vitals:   10/05/24 1800 10/05/24 1830  BP: 110/62 126/74  Pulse: 63 83  Resp: 17 19  Temp:    SpO2: 97% 95%     General: Awake, no distress.  CV:  Good peripheral perfusion.  Resp:  Normal effort.  No tachypnea or respiratory distress Abd:  No distention.  Nontender Other:  No obvious lower extremity edema, no  unilateral calf swelling or tenderness   ED Results / Procedures / Treatments   Labs (all labs ordered are listed, but only abnormal results are displayed) Labs Reviewed  CBC - Abnormal; Notable for the following components:      Result Value   RBC 3.83 (*)    Hemoglobin 11.8 (*)    HCT 35.5 (*)    All other components within normal limits  BASIC METABOLIC PANEL WITH GFR - Abnormal; Notable for the following components:   Glucose, Bld 58 (*)    All other components within normal limits  CBG MONITORING, ED - Abnormal; Notable for the following components:   Glucose-Capillary 129 (*)    All other components within normal limits  TROPONIN T, HIGH SENSITIVITY     EKG  EKG shows, atrial fibrillation, rate 66, normal QRS, normal QTc, no obvious ischemic ST elevation, T wave flattening in aVL, not significantly changed compared to prior   RADIOLOGY On my independent interpretation, chest x-ray  without obvious consolidation   PROCEDURES:  Critical Care performed: No  Procedures   MEDICATIONS ORDERED IN ED: Medications - No data to display   IMPRESSION / MDM / ASSESSMENT AND PLAN / ED COURSE  I reviewed the triage vital signs and the nursing notes.                              Differential diagnosis includes, but is not limited to, angina, ACS, musculoskeletal pain, strain, arrhythmia, electrolyte derangements.  Did consider infection but she denies any infectious symptoms at this time.  Also did consider PE but patient states that she is compliant with her medications including anticoagulation, is not hypoxic, not tachycardic here.  Not short of breath.  Will get labs, EKG, troponin, chest x-ray.  Patient's presentation is most consistent with acute presentation with potential threat to life or bodily function.  Independent interpretation of labs and imaging below.  On reassessment patient is asymptomatic, glucose incidentally noted to be low, given some juice and  crackers, repeat blood glucose was 129.  Did discuss with patient about taking her medications as prescribed.  Did discuss with her about following up with cardiology this week or next week to get reassessed.  Strict return precautions given.  Discharge.  Considered but no indication for inpatient admission at this time, she safe for outpatient management.  The patient is on the cardiac monitor to evaluate for evidence of arrhythmia and/or significant heart rate changes.   Clinical Course as of 10/05/24 2045  Mon Oct 05, 2024  1636 DG Chest 2 View No active cardiopulmonary disease.  [TT]  1959 Independent review of labs, troponins not elevated, no leukocytosis, electrolytes not severely deranged, her glucose is low, has not eaten for several hours.  Will give her some juice and crackers here.  Will recheck her glucose before discharge. [TT]  2044 blood glucose 129.  Patient is asymptomatic [TT]    Clinical Course User Index [TT] [place], Lorelle Cummins, MD     FINAL CLINICAL IMPRESSION(S) / ED DIAGNOSES   Final diagnoses:  Chest pain, unspecified type  Palpitations  Lightheadedness  Hypoglycemia     Rx / DC Orders   ED Discharge Orders     None        Note:  This document was prepared using Dragon voice recognition software and may include unintentional dictation errors.    [place] Lorelle Cummins, MD 10/05/24 2045  "

## 2024-10-05 NOTE — ED Notes (Signed)
Pt provided crackers and ginger ale.

## 2024-10-14 ENCOUNTER — Telehealth: Payer: Self-pay | Admitting: Family

## 2024-10-14 NOTE — Telephone Encounter (Signed)
 Called to r/s appt from 11/14

## 2024-10-16 ENCOUNTER — Telehealth: Payer: Self-pay | Admitting: Family

## 2024-10-19 DIAGNOSIS — I480 Paroxysmal atrial fibrillation: Secondary | ICD-10-CM

## 2024-11-13 ENCOUNTER — Other Ambulatory Visit: Payer: Self-pay | Admitting: Cardiovascular Disease

## 2024-11-13 DIAGNOSIS — I480 Paroxysmal atrial fibrillation: Secondary | ICD-10-CM

## 2024-11-13 NOTE — Telephone Encounter (Signed)
 Eliquis  5mg  refill request received. Patient is 74 years old, weight-93kg, Crea-0.85 on 10/05/24, Diagnosis-Afib, and last seen by Lesley Maffucci, PA on 06/11/24. Dose is appropriate based on dosing criteria. Will send in refill to requested pharmacy.

## 2024-11-17 ENCOUNTER — Telehealth: Payer: Self-pay

## 2024-11-17 ENCOUNTER — Encounter (HOSPITAL_COMMUNITY): Payer: Self-pay

## 2024-11-17 NOTE — Telephone Encounter (Signed)
 Advanced Heart Failure Patient Advocate Encounter  This patient was renewed for a [employer] grant that will help cover the cost of Eliquis , Farxiga , Losartan , Metoprolol , Verapamil .  Total amount awarded, $7,500.  Effective: 10/18/2024 - 10/17/2025.  BIN [id number] PCN [id number] [id number] ID [id number]  Pharmacy provided with approval and processing information. Patient informed via MyChart.  Rachel DEL, CPhT Rx Patient Advocate Phone: [phone]
